# Patient Record
Sex: Female | Born: 1935 | Race: Black or African American | Hispanic: No | Marital: Married | Smoking: Former smoker
Health system: Southern US, Community
[De-identification: ages and names within clinical notes are randomized; demographics above are authoritative.]

## PROBLEM LIST (undated history)

## (undated) DIAGNOSIS — I10 Essential (primary) hypertension: Secondary | ICD-10-CM

## (undated) DIAGNOSIS — E669 Obesity, unspecified: Secondary | ICD-10-CM

## (undated) DIAGNOSIS — M1990 Unspecified osteoarthritis, unspecified site: Secondary | ICD-10-CM

## (undated) DIAGNOSIS — N189 Chronic kidney disease, unspecified: Secondary | ICD-10-CM

## (undated) DIAGNOSIS — T783XXA Angioneurotic edema, initial encounter: Secondary | ICD-10-CM

## (undated) DIAGNOSIS — R001 Bradycardia, unspecified: Secondary | ICD-10-CM

## (undated) DIAGNOSIS — M419 Scoliosis, unspecified: Secondary | ICD-10-CM

## (undated) DIAGNOSIS — L509 Urticaria, unspecified: Secondary | ICD-10-CM

## (undated) DIAGNOSIS — K219 Gastro-esophageal reflux disease without esophagitis: Secondary | ICD-10-CM

## (undated) DIAGNOSIS — T8859XA Other complications of anesthesia, initial encounter: Secondary | ICD-10-CM

## (undated) DIAGNOSIS — G4730 Sleep apnea, unspecified: Secondary | ICD-10-CM

## (undated) DIAGNOSIS — T4145XA Adverse effect of unspecified anesthetic, initial encounter: Secondary | ICD-10-CM

## (undated) DIAGNOSIS — Z923 Personal history of irradiation: Secondary | ICD-10-CM

## (undated) DIAGNOSIS — C50919 Malignant neoplasm of unspecified site of unspecified female breast: Secondary | ICD-10-CM

## (undated) HISTORY — DX: Scoliosis, unspecified: M41.9

## (undated) HISTORY — DX: Urticaria, unspecified: L50.9

## (undated) HISTORY — DX: Bradycardia, unspecified: R00.1

## (undated) HISTORY — DX: Obesity, unspecified: E66.9

## (undated) HISTORY — DX: Angioneurotic edema, initial encounter: T78.3XXA

## (undated) HISTORY — PX: DILATION AND CURETTAGE OF UTERUS: SHX78

## (undated) HISTORY — PX: TONSILLECTOMY: SUR1361

## (undated) HISTORY — DX: Sleep apnea, unspecified: G47.30

## (undated) HISTORY — DX: Malignant neoplasm of unspecified site of unspecified female breast: C50.919

## (undated) HISTORY — DX: Essential (primary) hypertension: I10

## (undated) HISTORY — PX: CARPAL TUNNEL RELEASE: SHX101

---

## 1995-04-07 HISTORY — PX: TYMPANOPLASTY: SHX33

## 1998-05-21 ENCOUNTER — Other Ambulatory Visit: Admission: RE | Admit: 1998-05-21 | Discharge: 1998-05-21 | Payer: Self-pay | Admitting: Gynecology

## 1998-06-26 ENCOUNTER — Ambulatory Visit (HOSPITAL_COMMUNITY): Admission: RE | Admit: 1998-06-26 | Discharge: 1998-06-26 | Payer: Self-pay | Admitting: Gynecology

## 1999-02-05 ENCOUNTER — Encounter: Payer: Self-pay | Admitting: Gynecology

## 1999-02-05 ENCOUNTER — Encounter: Admission: RE | Admit: 1999-02-05 | Discharge: 1999-02-05 | Payer: Self-pay | Admitting: Gynecology

## 1999-05-27 ENCOUNTER — Other Ambulatory Visit: Admission: RE | Admit: 1999-05-27 | Discharge: 1999-05-27 | Payer: Self-pay | Admitting: Gynecology

## 2000-03-04 ENCOUNTER — Encounter: Admission: RE | Admit: 2000-03-04 | Discharge: 2000-03-04 | Payer: Self-pay | Admitting: Gynecology

## 2000-03-04 ENCOUNTER — Encounter: Payer: Self-pay | Admitting: Gynecology

## 2000-05-18 ENCOUNTER — Ambulatory Visit (HOSPITAL_COMMUNITY): Admission: RE | Admit: 2000-05-18 | Discharge: 2000-05-18 | Payer: Self-pay | Admitting: Gastroenterology

## 2000-05-18 ENCOUNTER — Encounter (INDEPENDENT_AMBULATORY_CARE_PROVIDER_SITE_OTHER): Payer: Self-pay

## 2000-05-27 ENCOUNTER — Other Ambulatory Visit: Admission: RE | Admit: 2000-05-27 | Discharge: 2000-05-27 | Payer: Self-pay | Admitting: Gynecology

## 2001-06-30 ENCOUNTER — Encounter: Admission: RE | Admit: 2001-06-30 | Discharge: 2001-06-30 | Payer: Self-pay | Admitting: Gynecology

## 2001-06-30 ENCOUNTER — Encounter: Payer: Self-pay | Admitting: Gynecology

## 2001-06-30 ENCOUNTER — Encounter: Payer: Self-pay | Admitting: Internal Medicine

## 2001-07-28 ENCOUNTER — Emergency Department (HOSPITAL_COMMUNITY): Admission: EM | Admit: 2001-07-28 | Discharge: 2001-07-28 | Payer: Self-pay | Admitting: Emergency Medicine

## 2001-09-13 ENCOUNTER — Encounter: Admission: RE | Admit: 2001-09-13 | Discharge: 2001-09-13 | Payer: Self-pay | Admitting: Otolaryngology

## 2001-09-13 ENCOUNTER — Encounter: Payer: Self-pay | Admitting: Otolaryngology

## 2001-10-20 ENCOUNTER — Other Ambulatory Visit: Admission: RE | Admit: 2001-10-20 | Discharge: 2001-10-20 | Payer: Self-pay | Admitting: Gynecology

## 2002-04-05 ENCOUNTER — Encounter: Payer: Self-pay | Admitting: Orthopedic Surgery

## 2002-04-10 ENCOUNTER — Ambulatory Visit (HOSPITAL_COMMUNITY): Admission: RE | Admit: 2002-04-10 | Discharge: 2002-04-10 | Payer: Self-pay | Admitting: Orthopedic Surgery

## 2002-11-03 ENCOUNTER — Encounter: Admission: RE | Admit: 2002-11-03 | Discharge: 2002-11-03 | Payer: Self-pay | Admitting: Internal Medicine

## 2002-11-03 ENCOUNTER — Encounter: Payer: Self-pay | Admitting: Internal Medicine

## 2003-01-02 ENCOUNTER — Encounter: Payer: Self-pay | Admitting: Otolaryngology

## 2003-01-02 ENCOUNTER — Encounter: Admission: RE | Admit: 2003-01-02 | Discharge: 2003-01-02 | Payer: Self-pay | Admitting: Otolaryngology

## 2003-01-10 ENCOUNTER — Ambulatory Visit (HOSPITAL_COMMUNITY): Admission: RE | Admit: 2003-01-10 | Discharge: 2003-01-11 | Payer: Self-pay | Admitting: Otolaryngology

## 2003-01-10 ENCOUNTER — Encounter (INDEPENDENT_AMBULATORY_CARE_PROVIDER_SITE_OTHER): Payer: Self-pay | Admitting: *Deleted

## 2003-10-26 ENCOUNTER — Ambulatory Visit (HOSPITAL_COMMUNITY): Admission: RE | Admit: 2003-10-26 | Discharge: 2003-10-26 | Payer: Self-pay | Admitting: Internal Medicine

## 2004-02-08 ENCOUNTER — Encounter: Admission: RE | Admit: 2004-02-08 | Discharge: 2004-02-08 | Payer: Self-pay | Admitting: Gynecology

## 2004-12-11 ENCOUNTER — Other Ambulatory Visit: Admission: RE | Admit: 2004-12-11 | Discharge: 2004-12-11 | Payer: Self-pay | Admitting: Gynecology

## 2005-02-24 ENCOUNTER — Encounter: Admission: RE | Admit: 2005-02-24 | Discharge: 2005-02-24 | Payer: Self-pay | Admitting: Gynecology

## 2005-12-04 ENCOUNTER — Encounter: Admission: RE | Admit: 2005-12-04 | Discharge: 2005-12-04 | Payer: Self-pay | Admitting: Internal Medicine

## 2006-01-11 ENCOUNTER — Other Ambulatory Visit: Admission: RE | Admit: 2006-01-11 | Discharge: 2006-01-11 | Payer: Self-pay | Admitting: Gynecology

## 2006-03-01 ENCOUNTER — Encounter: Admission: RE | Admit: 2006-03-01 | Discharge: 2006-03-01 | Payer: Self-pay | Admitting: Gynecology

## 2006-09-13 ENCOUNTER — Encounter: Admission: RE | Admit: 2006-09-13 | Discharge: 2006-09-13 | Payer: Self-pay | Admitting: Internal Medicine

## 2007-04-15 ENCOUNTER — Encounter: Admission: RE | Admit: 2007-04-15 | Discharge: 2007-04-15 | Payer: Self-pay | Admitting: Internal Medicine

## 2007-05-02 ENCOUNTER — Encounter: Admission: RE | Admit: 2007-05-02 | Discharge: 2007-05-02 | Payer: Self-pay | Admitting: Internal Medicine

## 2007-06-03 ENCOUNTER — Emergency Department (HOSPITAL_COMMUNITY): Admission: EM | Admit: 2007-06-03 | Discharge: 2007-06-04 | Payer: Self-pay | Admitting: Emergency Medicine

## 2007-11-29 ENCOUNTER — Emergency Department (HOSPITAL_COMMUNITY): Admission: EM | Admit: 2007-11-29 | Discharge: 2007-11-30 | Payer: Self-pay | Admitting: Emergency Medicine

## 2007-11-29 ENCOUNTER — Encounter: Admission: RE | Admit: 2007-11-29 | Discharge: 2007-11-29 | Payer: Self-pay | Admitting: Internal Medicine

## 2008-04-06 DIAGNOSIS — C50919 Malignant neoplasm of unspecified site of unspecified female breast: Secondary | ICD-10-CM

## 2008-04-06 HISTORY — DX: Malignant neoplasm of unspecified site of unspecified female breast: C50.919

## 2008-06-12 ENCOUNTER — Encounter: Admission: RE | Admit: 2008-06-12 | Discharge: 2008-06-12 | Payer: Self-pay | Admitting: Gynecology

## 2008-06-19 ENCOUNTER — Encounter: Admission: RE | Admit: 2008-06-19 | Discharge: 2008-06-19 | Payer: Self-pay | Admitting: Gynecology

## 2008-06-19 ENCOUNTER — Encounter (INDEPENDENT_AMBULATORY_CARE_PROVIDER_SITE_OTHER): Payer: Self-pay | Admitting: Diagnostic Radiology

## 2008-06-27 ENCOUNTER — Ambulatory Visit: Admission: RE | Admit: 2008-06-27 | Discharge: 2008-08-16 | Payer: Self-pay | Admitting: Radiation Oncology

## 2008-07-05 ENCOUNTER — Encounter: Admission: RE | Admit: 2008-07-05 | Discharge: 2008-07-05 | Payer: Self-pay | Admitting: Surgery

## 2008-07-05 HISTORY — PX: BREAST LUMPECTOMY: SHX2

## 2008-07-10 ENCOUNTER — Encounter: Admission: RE | Admit: 2008-07-10 | Discharge: 2008-07-10 | Payer: Self-pay | Admitting: Surgery

## 2008-07-10 ENCOUNTER — Encounter (INDEPENDENT_AMBULATORY_CARE_PROVIDER_SITE_OTHER): Payer: Self-pay | Admitting: Surgery

## 2008-07-10 ENCOUNTER — Ambulatory Visit (HOSPITAL_BASED_OUTPATIENT_CLINIC_OR_DEPARTMENT_OTHER): Admission: RE | Admit: 2008-07-10 | Discharge: 2008-07-10 | Payer: Self-pay | Admitting: Surgery

## 2008-07-23 ENCOUNTER — Ambulatory Visit: Payer: Self-pay | Admitting: Oncology

## 2008-08-08 LAB — CBC WITH DIFFERENTIAL/PLATELET
Basophils Absolute: 0 10*3/uL (ref 0.0–0.1)
Eosinophils Absolute: 0.1 10*3/uL (ref 0.0–0.5)
HCT: 35.8 % (ref 34.8–46.6)
HGB: 12 g/dL (ref 11.6–15.9)
LYMPH%: 21.5 % (ref 14.0–49.7)
MCV: 83.2 fL (ref 79.5–101.0)
MONO#: 0.7 10*3/uL (ref 0.1–0.9)
MONO%: 10.1 % (ref 0.0–14.0)
NEUT#: 4.3 10*3/uL (ref 1.5–6.5)
Platelets: 228 10*3/uL (ref 145–400)
RBC: 4.31 10*6/uL (ref 3.70–5.45)
WBC: 6.6 10*3/uL (ref 3.9–10.3)

## 2008-08-09 LAB — COMPREHENSIVE METABOLIC PANEL
Albumin: 4.1 g/dL (ref 3.5–5.2)
Alkaline Phosphatase: 106 U/L (ref 39–117)
BUN: 33 mg/dL — ABNORMAL HIGH (ref 6–23)
CO2: 21 mEq/L (ref 19–32)
Glucose, Bld: 96 mg/dL (ref 70–99)
Potassium: 3.9 mEq/L (ref 3.5–5.3)
Sodium: 139 mEq/L (ref 135–145)
Total Bilirubin: 0.3 mg/dL (ref 0.3–1.2)
Total Protein: 7.3 g/dL (ref 6.0–8.3)

## 2008-08-09 LAB — CANCER ANTIGEN 27.29: CA 27.29: 37 U/mL (ref 0–39)

## 2008-08-09 LAB — VITAMIN D 25 HYDROXY (VIT D DEFICIENCY, FRACTURES): Vit D, 25-Hydroxy: 38 ng/mL (ref 30–89)

## 2008-08-09 LAB — LACTATE DEHYDROGENASE: LDH: 172 U/L (ref 94–250)

## 2008-11-09 ENCOUNTER — Ambulatory Visit: Payer: Self-pay | Admitting: Oncology

## 2008-11-13 LAB — CBC WITH DIFFERENTIAL/PLATELET
BASO%: 0.7 % (ref 0.0–2.0)
Basophils Absolute: 0.1 10*3/uL (ref 0.0–0.1)
EOS%: 1.1 % (ref 0.0–7.0)
HGB: 11.6 g/dL (ref 11.6–15.9)
MCH: 27.8 pg (ref 25.1–34.0)
MCHC: 33.2 g/dL (ref 31.5–36.0)
MCV: 83.7 fL (ref 79.5–101.0)
MONO%: 11.9 % (ref 0.0–14.0)
RBC: 4.18 10*6/uL (ref 3.70–5.45)
RDW: 15.6 % — ABNORMAL HIGH (ref 11.2–14.5)
lymph#: 2.4 10*3/uL (ref 0.9–3.3)

## 2008-11-13 LAB — BASIC METABOLIC PANEL
Chloride: 101 mEq/L (ref 96–112)
Potassium: 3.8 mEq/L (ref 3.5–5.3)
Sodium: 135 mEq/L (ref 135–145)

## 2009-01-01 DIAGNOSIS — Z853 Personal history of malignant neoplasm of breast: Secondary | ICD-10-CM | POA: Insufficient documentation

## 2009-03-27 DIAGNOSIS — E785 Hyperlipidemia, unspecified: Secondary | ICD-10-CM | POA: Diagnosis present

## 2009-03-27 DIAGNOSIS — K219 Gastro-esophageal reflux disease without esophagitis: Secondary | ICD-10-CM | POA: Diagnosis present

## 2009-03-27 DIAGNOSIS — E559 Vitamin D deficiency, unspecified: Secondary | ICD-10-CM | POA: Insufficient documentation

## 2009-03-27 DIAGNOSIS — M109 Gout, unspecified: Secondary | ICD-10-CM | POA: Insufficient documentation

## 2009-03-27 DIAGNOSIS — E1129 Type 2 diabetes mellitus with other diabetic kidney complication: Secondary | ICD-10-CM | POA: Insufficient documentation

## 2009-05-09 ENCOUNTER — Ambulatory Visit: Payer: Self-pay | Admitting: Oncology

## 2009-05-13 LAB — CBC WITH DIFFERENTIAL/PLATELET
Basophils Absolute: 0 10*3/uL (ref 0.0–0.1)
Eosinophils Absolute: 0.2 10*3/uL (ref 0.0–0.5)
HGB: 10.9 g/dL — ABNORMAL LOW (ref 11.6–15.9)
MCV: 85.5 fL (ref 79.5–101.0)
MONO%: 8 % (ref 0.0–14.0)
NEUT#: 5.4 10*3/uL (ref 1.5–6.5)
RBC: 3.83 10*6/uL (ref 3.70–5.45)
RDW: 16.5 % — ABNORMAL HIGH (ref 11.2–14.5)
WBC: 8 10*3/uL (ref 3.9–10.3)
lymph#: 1.6 10*3/uL (ref 0.9–3.3)

## 2009-05-13 LAB — COMPREHENSIVE METABOLIC PANEL
AST: 16 U/L (ref 0–37)
Albumin: 3.9 g/dL (ref 3.5–5.2)
Alkaline Phosphatase: 101 U/L (ref 39–117)
Calcium: 9.7 mg/dL (ref 8.4–10.5)
Chloride: 103 mEq/L (ref 96–112)
Glucose, Bld: 87 mg/dL (ref 70–99)
Potassium: 3.8 mEq/L (ref 3.5–5.3)
Sodium: 139 mEq/L (ref 135–145)
Total Protein: 7.2 g/dL (ref 6.0–8.3)

## 2009-05-13 LAB — VITAMIN D 25 HYDROXY (VIT D DEFICIENCY, FRACTURES): Vit D, 25-Hydroxy: 48 ng/mL (ref 30–89)

## 2009-05-28 ENCOUNTER — Encounter: Admission: RE | Admit: 2009-05-28 | Discharge: 2009-05-28 | Payer: Self-pay | Admitting: Surgery

## 2009-08-09 DIAGNOSIS — D509 Iron deficiency anemia, unspecified: Secondary | ICD-10-CM | POA: Insufficient documentation

## 2009-11-07 ENCOUNTER — Ambulatory Visit: Payer: Self-pay | Admitting: Oncology

## 2009-11-11 LAB — CBC WITH DIFFERENTIAL/PLATELET
BASO%: 0.7 % (ref 0.0–2.0)
Basophils Absolute: 0.1 10*3/uL (ref 0.0–0.1)
EOS%: 0.7 % (ref 0.0–7.0)
HCT: 32.6 % — ABNORMAL LOW (ref 34.8–46.6)
HGB: 10.9 g/dL — ABNORMAL LOW (ref 11.6–15.9)
MCH: 28.4 pg (ref 25.1–34.0)
MONO#: 0.5 10*3/uL (ref 0.1–0.9)
NEUT%: 68 % (ref 38.4–76.8)
RDW: 16.8 % — ABNORMAL HIGH (ref 11.2–14.5)
WBC: 7.9 10*3/uL (ref 3.9–10.3)
lymph#: 1.9 10*3/uL (ref 0.9–3.3)

## 2009-11-11 LAB — COMPREHENSIVE METABOLIC PANEL
ALT: 12 U/L (ref 0–35)
AST: 14 U/L (ref 0–37)
Albumin: 4.2 g/dL (ref 3.5–5.2)
BUN: 30 mg/dL — ABNORMAL HIGH (ref 6–23)
CO2: 26 mEq/L (ref 19–32)
Calcium: 9.7 mg/dL (ref 8.4–10.5)
Chloride: 103 mEq/L (ref 96–112)
Creatinine, Ser: 1.49 mg/dL — ABNORMAL HIGH (ref 0.40–1.20)
Potassium: 3.8 mEq/L (ref 3.5–5.3)

## 2009-12-20 ENCOUNTER — Ambulatory Visit: Payer: Self-pay | Admitting: Cardiovascular Disease

## 2010-04-02 ENCOUNTER — Encounter
Admission: RE | Admit: 2010-04-02 | Discharge: 2010-04-02 | Payer: Self-pay | Source: Home / Self Care | Attending: Internal Medicine | Admitting: Internal Medicine

## 2010-04-27 ENCOUNTER — Encounter: Payer: Self-pay | Admitting: Internal Medicine

## 2010-04-27 ENCOUNTER — Encounter: Payer: Self-pay | Admitting: Gynecology

## 2010-06-16 IMAGING — MG MM DIAGNOSTIC UNILATERAL L
2 series · 2 of 2 positions shown · non-contrast
Comparison: Recent mammogram, ultrasound and biopsy examinations.

CLINICAL DATA: Status post ultrasound-guided core needle biopsy of
a 6 mm 11 o'clock left breast mass.

DIGITAL DIAGNOSTIC LEFT MAMMOGRAM

[L CC]
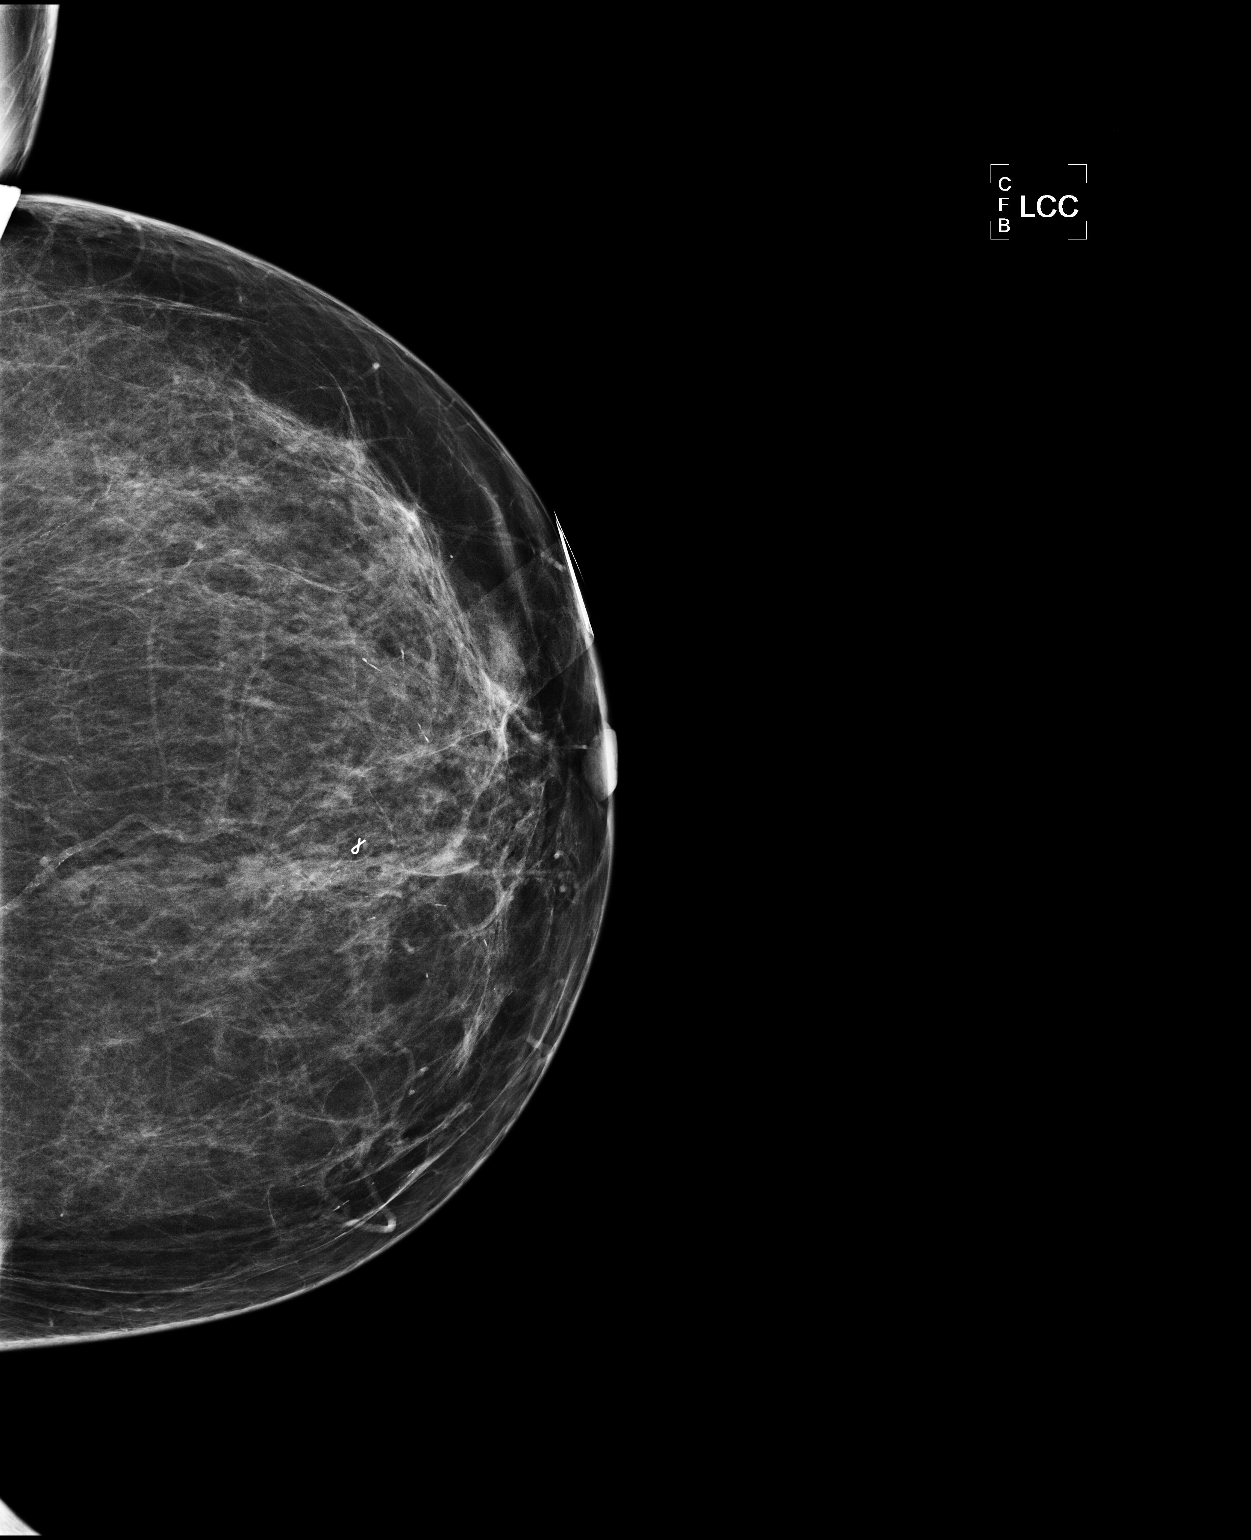

[L ML]
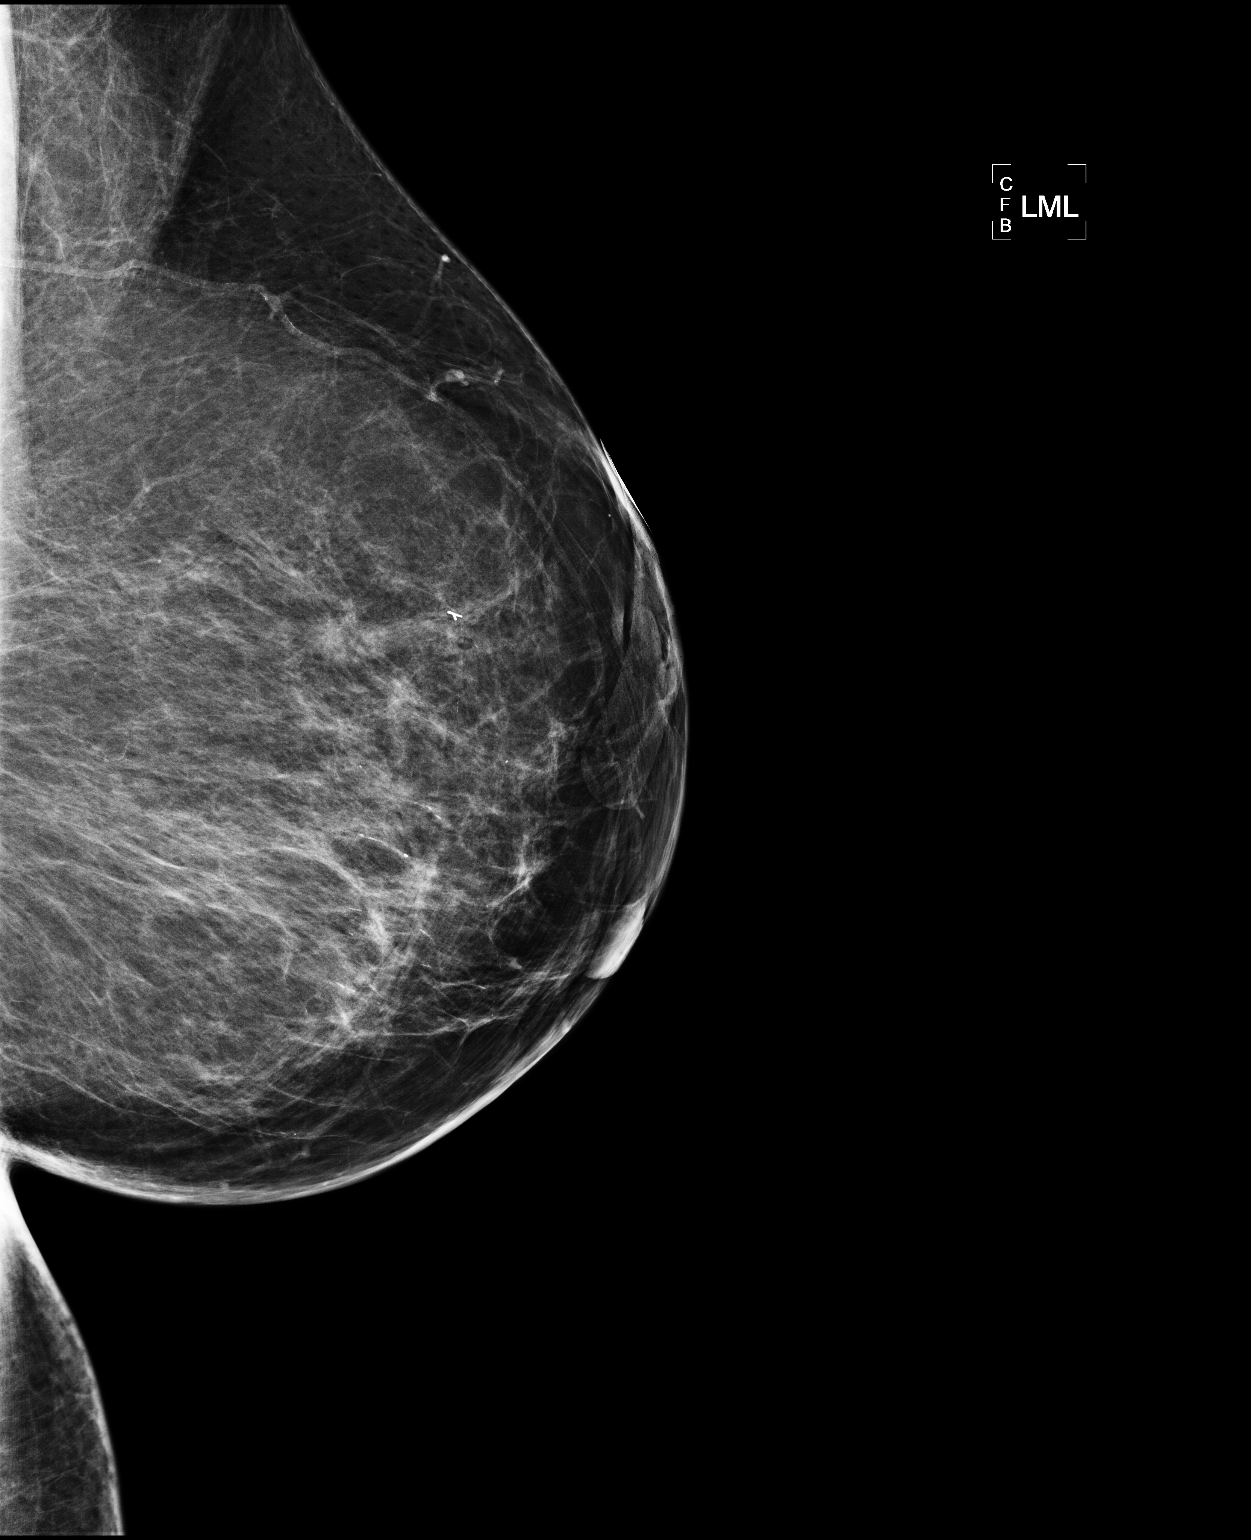

[2 of 2 positions shown; findings below may reference images not displayed]

FINDINGS: Images are performed following ultrasound guided biopsy
of a 6 mm mass in the 11 o'clock position of the left breast is
seen earlier today.  These demonstrate clip deployment anterior to
the mass.  The center of the clip is 2 cm anterior to the center of
the mass.
IMPRESSION: The biopsy marker clip deployed 2 cm anterior to the targeted mass.
This can still be used for mammographic needle localization,
targeting 2 cm posterior to the clip.

## 2010-06-16 IMAGING — MG MM DIAGNOSTIC LTD LEFT
4 series · 4 of 4 positions shown · non-contrast
Comparison: Previous examinations, including the screening
mammogram dated 06/12/2008.

CLINICAL DATA: Possible left breast mass at recent screening
mammography.

DIGITAL DIAGNOSTIC  LEFT  MAMMOGRAM  WITH CAD AND LEFT BREAST
ULTRASOUND:

[L CC (1 of 2)]
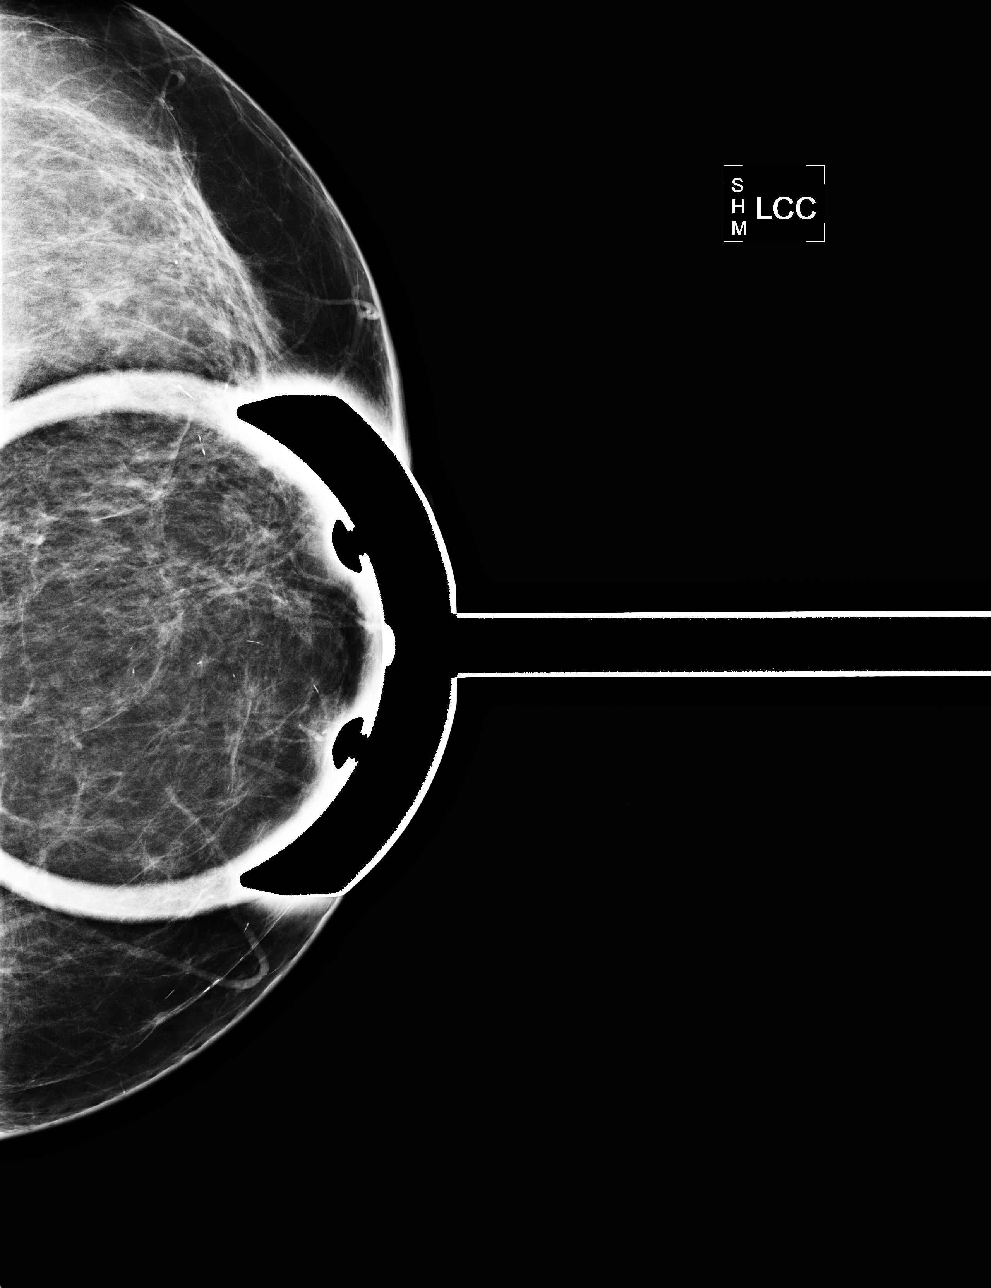

[L MLO]
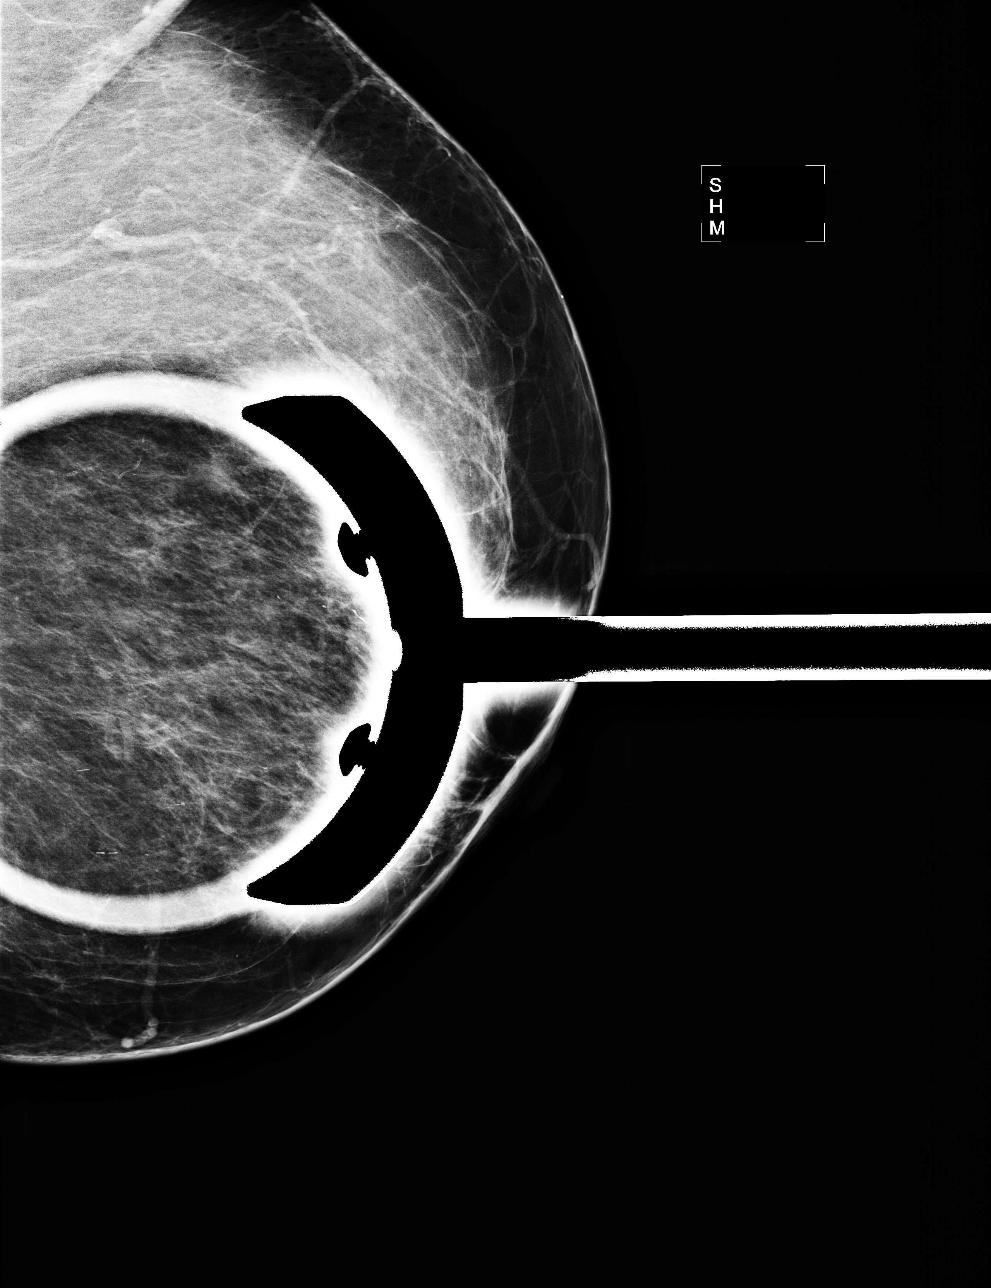

[L CC (2 of 2)]
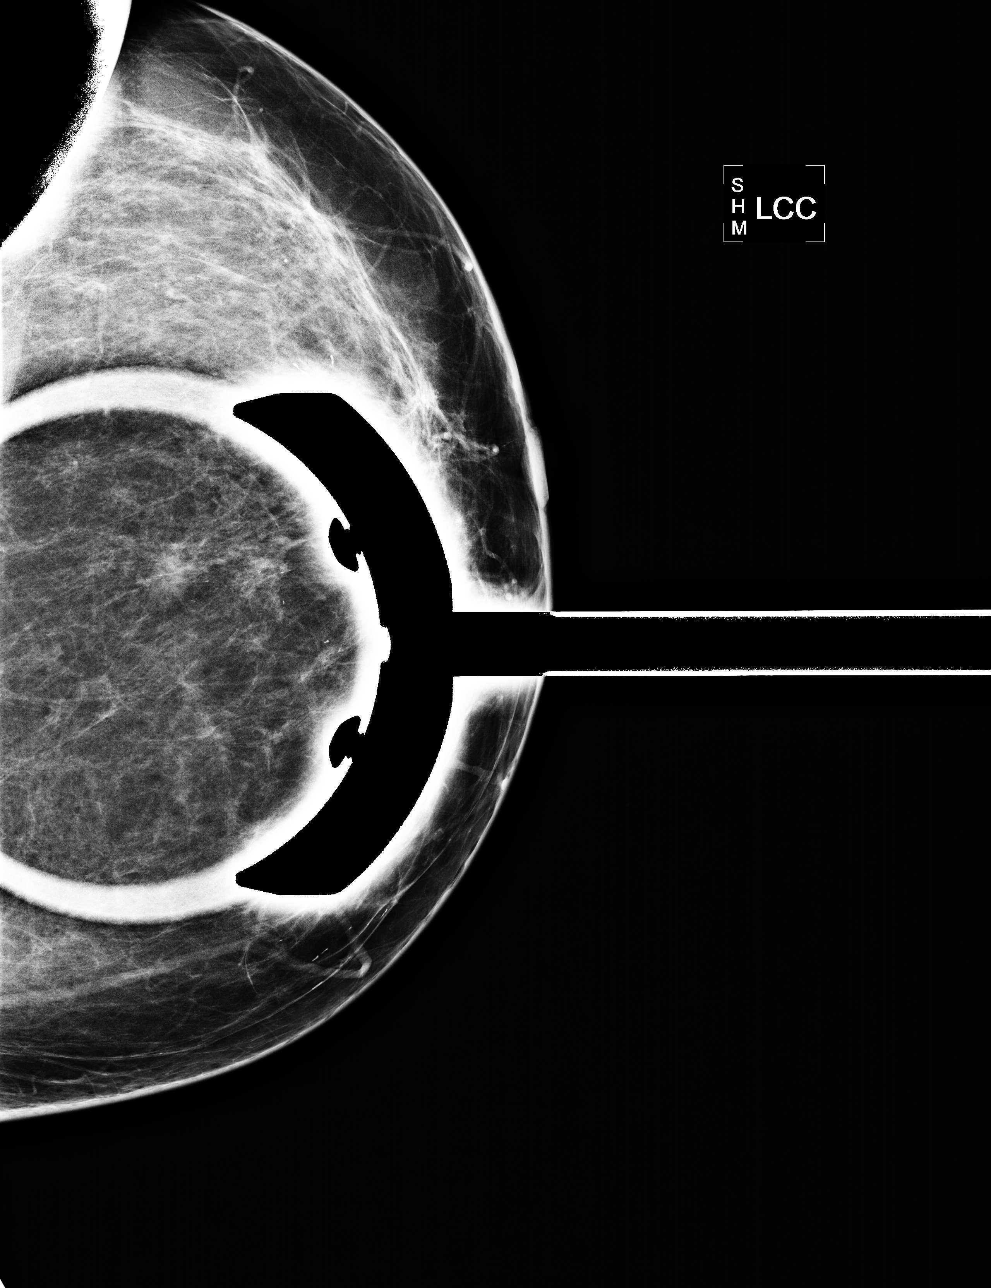

[L ML]
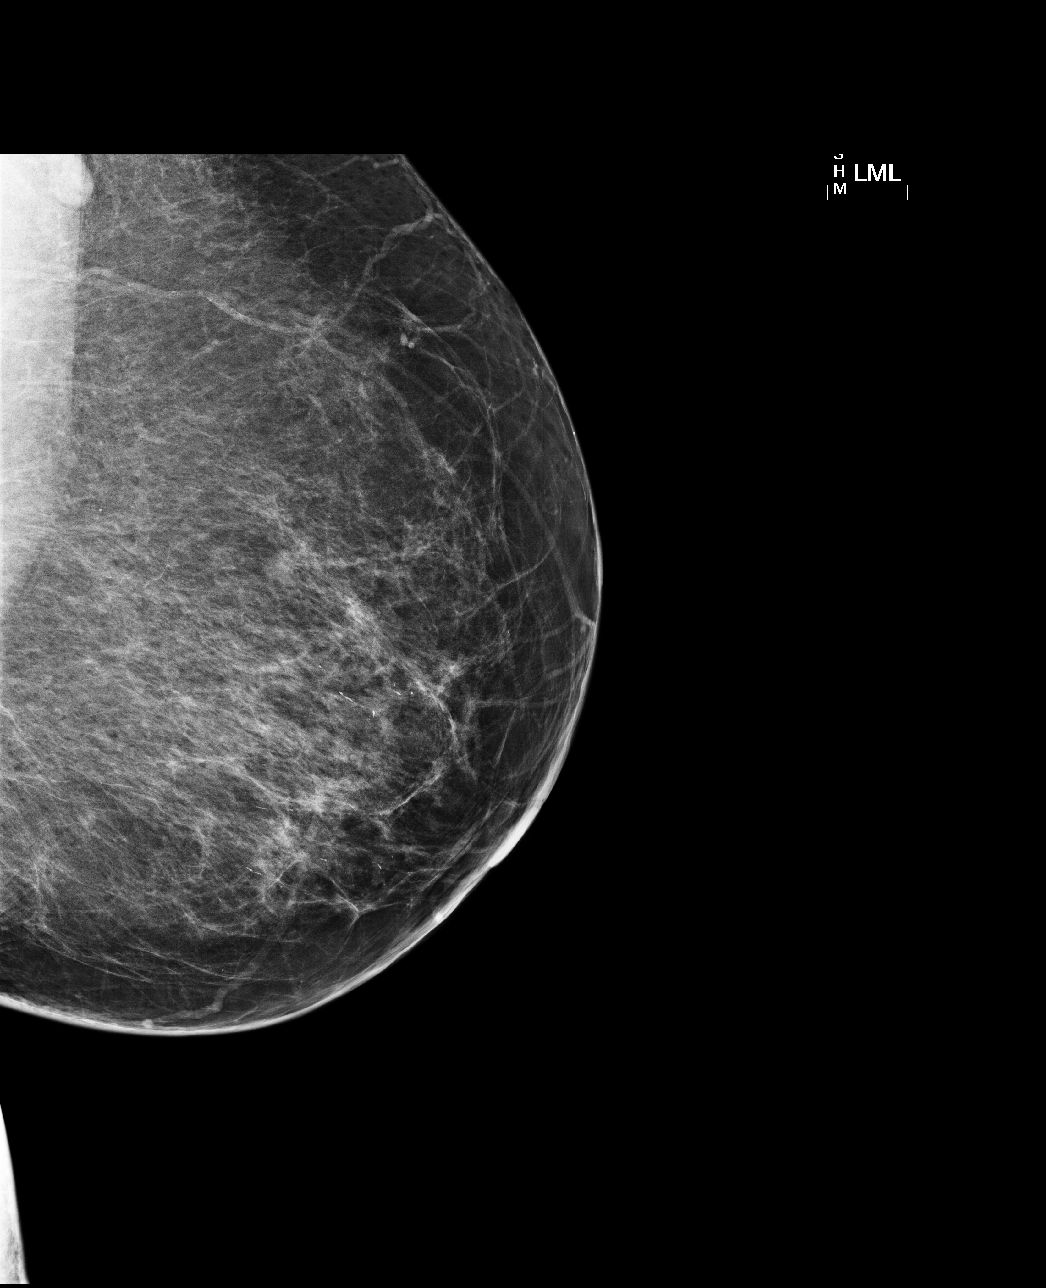

[4 of 4 positions shown; findings below may reference images not displayed]

FINDINGS: Spot compression views and a true lateral view of the
left breast confirm a small mass with poorly defined, somewhat
spiculated margins in the 11-12 o'clock position of the breast.

On physical exam, no mass is palpable in the upper left breast.

Ultrasound is performed, showing a 6 x 5 x 3 mm irregular,
hypoechoic mass in the 11 o'clock position of the left breast, 5 cm
from the nipple.  This corresponds to the mammographic mass.
IMPRESSION: 6 x 5 x 3 mm mass in the 11 o'clock position of the left breast
with imaging features highly suspicious for malignancy.  Ultrasound-
guided core needle biopsy is recommended and scheduled to follow.

BI-RADS CATEGORY 5:  Highly suggestive of malignancy - appropriate
action should be taken.

## 2010-06-17 ENCOUNTER — Other Ambulatory Visit: Payer: Self-pay | Admitting: Oncology

## 2010-06-17 DIAGNOSIS — Z9889 Other specified postprocedural states: Secondary | ICD-10-CM

## 2010-06-20 ENCOUNTER — Ambulatory Visit: Payer: Self-pay | Admitting: Cardiovascular Disease

## 2010-06-27 ENCOUNTER — Ambulatory Visit
Admission: RE | Admit: 2010-06-27 | Discharge: 2010-06-27 | Disposition: A | Discharge status: Home / Self Care | Payer: Medicare Other | Source: Ambulatory Visit | Attending: Oncology | Admitting: Oncology

## 2010-06-27 DIAGNOSIS — Z9889 Other specified postprocedural states: Secondary | ICD-10-CM

## 2010-07-16 LAB — CBC
HCT: 36 % (ref 36.0–46.0)
MCV: 83.9 fL (ref 78.0–100.0)
Platelets: 222 10*3/uL (ref 150–400)
RDW: 15.4 % (ref 11.5–15.5)

## 2010-07-16 LAB — URINALYSIS, ROUTINE W REFLEX MICROSCOPIC
Bilirubin Urine: NEGATIVE
Glucose, UA: NEGATIVE mg/dL
Hgb urine dipstick: NEGATIVE
Ketones, ur: NEGATIVE mg/dL
Protein, ur: NEGATIVE mg/dL

## 2010-07-16 LAB — COMPREHENSIVE METABOLIC PANEL
Albumin: 3.6 g/dL (ref 3.5–5.2)
BUN: 18 mg/dL (ref 6–23)
Calcium: 9.2 mg/dL (ref 8.4–10.5)
Creatinine, Ser: 1.81 mg/dL — ABNORMAL HIGH (ref 0.4–1.2)
GFR calc Af Amer: 33 mL/min — ABNORMAL LOW (ref >60)
Total Protein: 7.1 g/dL (ref 6.0–8.3)

## 2010-07-16 LAB — DIFFERENTIAL
Eosinophils Absolute: 0.1 10*3/uL (ref 0.0–0.7)
Eosinophils Relative: 2 % (ref 0–5)
Lymphs Abs: 1.9 10*3/uL (ref 0.7–4.0)
Monocytes Absolute: 0.6 10*3/uL (ref 0.1–1.0)
Monocytes Relative: 8 % (ref 3–12)

## 2010-07-16 LAB — URINE MICROSCOPIC-ADD ON

## 2010-07-23 ENCOUNTER — Encounter: Payer: Self-pay | Admitting: Cardiovascular Disease

## 2010-07-23 ENCOUNTER — Other Ambulatory Visit: Payer: Self-pay | Admitting: Oncology

## 2010-07-23 ENCOUNTER — Encounter (HOSPITAL_BASED_OUTPATIENT_CLINIC_OR_DEPARTMENT_OTHER): Payer: Medicare Other | Admitting: Oncology

## 2010-07-23 DIAGNOSIS — C50219 Malignant neoplasm of upper-inner quadrant of unspecified female breast: Secondary | ICD-10-CM

## 2010-07-23 DIAGNOSIS — R001 Bradycardia, unspecified: Secondary | ICD-10-CM | POA: Insufficient documentation

## 2010-07-23 DIAGNOSIS — E669 Obesity, unspecified: Secondary | ICD-10-CM | POA: Insufficient documentation

## 2010-07-23 DIAGNOSIS — N189 Chronic kidney disease, unspecified: Secondary | ICD-10-CM

## 2010-07-23 DIAGNOSIS — Z170 Estrogen receptor positive status [ER+]: Secondary | ICD-10-CM

## 2010-07-23 DIAGNOSIS — I10 Essential (primary) hypertension: Secondary | ICD-10-CM | POA: Insufficient documentation

## 2010-07-23 DIAGNOSIS — R079 Chest pain, unspecified: Secondary | ICD-10-CM | POA: Insufficient documentation

## 2010-07-23 DIAGNOSIS — D649 Anemia, unspecified: Secondary | ICD-10-CM

## 2010-07-23 LAB — COMPREHENSIVE METABOLIC PANEL
AST: 21 U/L (ref 0–37)
Albumin: 3.5 g/dL (ref 3.5–5.2)
Alkaline Phosphatase: 90 U/L (ref 39–117)
BUN: 23 mg/dL (ref 6–23)
Calcium: 9.2 mg/dL (ref 8.4–10.5)
Chloride: 105 mEq/L (ref 96–112)
Potassium: 3.7 mEq/L (ref 3.5–5.3)
Sodium: 139 mEq/L (ref 135–145)
Total Protein: 7.1 g/dL (ref 6.0–8.3)

## 2010-07-23 LAB — CBC WITH DIFFERENTIAL/PLATELET
Basophils Absolute: 0.1 10*3/uL (ref 0.0–0.1)
EOS%: 1.5 % (ref 0.0–7.0)
Eosinophils Absolute: 0.1 10*3/uL (ref 0.0–0.5)
HGB: 11 g/dL — ABNORMAL LOW (ref 11.6–15.9)
MCH: 27.7 pg (ref 25.1–34.0)
NEUT#: 4.9 10*3/uL (ref 1.5–6.5)
RBC: 3.96 10*6/uL (ref 3.70–5.45)
RDW: 16.1 % — ABNORMAL HIGH (ref 11.2–14.5)
lymph#: 1.6 10*3/uL (ref 0.9–3.3)

## 2010-07-24 ENCOUNTER — Encounter: Payer: Self-pay | Admitting: Cardiovascular Disease

## 2010-07-24 ENCOUNTER — Ambulatory Visit (INDEPENDENT_AMBULATORY_CARE_PROVIDER_SITE_OTHER): Payer: Medicare Other | Admitting: Cardiovascular Disease

## 2010-07-24 VITALS — BP 120/62 | HR 60 | Wt 247.0 lb

## 2010-07-24 DIAGNOSIS — I10 Essential (primary) hypertension: Secondary | ICD-10-CM

## 2010-07-24 NOTE — Progress Notes (Signed)
Vicki [name] Date of Birth  01/07/1935 [facility] / [facility] [id number] [address]     [place], [place]  [address] [phone]  Fax  [phone]  History of Present Illness:  Vicki [name] is an elderly female with a history of bradycardia, obesity and hypertension. She's has done quite well from a cardiac standpoint. She continues to have significant bone pain presumably because of her cancer medications.She denies any chest pain, shortness breath, syncope, or presyncope.  Current Outpatient Prescriptions on File Prior to Visit  Medication Sig Dispense Refill  . allopurinol (ZYLOPRIM) 100 MG tablet Take 150 mg by mouth daily.        Marland Kitchen amLODipine (NORVASC) 2.5 MG tablet Take 2.5 mg by mouth daily.        Marland Kitchen aspirin 81 MG tablet Take 81 mg by mouth every other day.        . Calcium Carbonate-Simethicone (MAALOX MAX PO) Take by mouth as needed.        . Calcium Carbonate-Vitamin D (CALCIUM + D PO) Take 600 Units by mouth daily.        . carvedilol (COREG) 12.5 MG tablet Take 12.5 mg by mouth 2 (two) times daily with a meal.        . Cholecalciferol (VITAMIN D) 1000 UNITS capsule Take 1,000 Units by mouth daily.        . colchicine 0.6 MG tablet Take 0.6 mg by mouth as needed.        . hydrochlorothiazide 25 MG tablet Take 25 mg by mouth daily.        Marland Kitchen loratadine (CLARITIN) 10 MG tablet Take 10 mg by mouth as needed.        . Multiple Vitamin (MULTIVITAMIN) tablet Take 1 tablet by mouth daily.        . pravastatin (PRAVACHOL) 40 MG tablet Take 40 mg by mouth daily.        Marland Kitchen telmisartan (MICARDIS) 80 MG tablet Take 80 mg by mouth daily.        . traMADol (ULTRAM) 50 MG tablet Take 50 mg by mouth every 6 (six) hours as needed.        . ALPRAZOLAM PO Take by mouth as needed.        . Letrozole (FEMARA PO) Take by mouth daily.        Marland Kitchen PROMETHAZINE HCL PO Take by mouth as needed.          Allergies  Allergen Reactions  . Protonix   . Sulfur     Past  Medical History  Diagnosis Date  . Breast cancer   . Bradycardia   . Chest pain   . Obesity   . Hypertension   . Asthma     No past surgical history on file.  History  Smoking status  . Former Smoker  . Quit date: 04/06/1961  Smokeless tobacco  . Not on file    History  Alcohol Use No    No family history on file.  Reviw of Systems:  Reviewed in the HPI.  All other systems are negative.  Physical Exam: BP 120/62  Pulse 60  Wt 247 lb (112.038 kg) The patient is alert and oriented x 3.  The mood and affect are normal.  The skin is warm and dry.  Color is normal.  The HEENT exam reveals that the sclera are nonicteric.  The mucous membranes are moist.  The carotids are 2+ without bruits.  There is no thyromegaly.  There is no JVD.  The lungs are clear.  The chest wall is non tender.  The heart exam reveals a regular rate with a normal S1 and S2.  There are no murmurs, gallops, or rubs.  The PMI is not displaced.   Abdominal exam reveals good bowel sounds.  There is no guarding or rebound.  There is no hepatosplenomegaly or tenderness.  There are no masses.  Exam of the legs reveal no clubbing, cyanosis, or edema.  The legs are without rashes.  The distal pulses are intact.  Cranial nerves II - XII are intact.  Motor and sensory functions are intact.  The gait is normal.  Assessment / Plan:

## 2010-07-24 NOTE — Assessment & Plan Note (Signed)
Her blood pressure is well controlled.  We'll continue with her same medications. I'll see her again in one year.

## 2010-08-19 NOTE — Op Note (Signed)
NAMEKEYONNA, Vicki [name]               ACCOUNT NO.:  [id number]   MEDICAL RECORD NO.:  [id number]          PATIENT TYPE:  AMB   LOCATION:  [place]                          FACILITY:  [facility]   PHYSICIAN:  Haywood Lasso, M.D.DATE OF BIRTH:  1934-12-02   DATE OF PROCEDURE:  07/10/2008  DATE OF DISCHARGE:                               OPERATIVE REPORT   PREOPERATIVE DIAGNOSIS:  Carcinoma, left breast, upper inner quadrant,  clinical stage I.   POSTOPERATIVE DIAGNOSIS:  Carcinoma, left breast, upper inner quadrant,  clinical stage I.   OPERATION:  Needle-guided lumpectomy with blue dye injection, sentinel  lymph node biopsy, and MammoSite CED placement.   SURGEON:  Haywood Lasso, MD   ANESTHESIA:  General.   CLINICAL HISTORY:  This is a 76 year old lady who recently had a  mammogram found to have a left breast cancer in the upper inner  quadrant.  MRI was not possible because of slight elevation and renal  function.  She was seen by radiation therapy and elected to have a  MammoSite radiation if possible.   DESCRIPTION OF PROCEDURE:  The patient saw the patient in the holding  area.  She had no further questions.  We both identified and marked the  left breast as the operative site.  She had no further questions about  the planned procedure.  I did review the mammogram films and she had two  guidewires placed, one just posterior to mass and one anterior closer to  a clip that had been placed.   The patient was taken to the operating room.  After satisfactory general  anesthesia had been obtained, the time-out was done.  I then injected  the left nipple-areolar area with 5 mL of dilute methylene blue and this  was massaged in.   Prep and drape was then done.  Using the Neoprobe, I was able to  identify hot area in the axilla and I made a transverse incision there.  I found a blue lymphatic almost immediately leading to about 1.5 to 2 cm  soft blue lymph node, which was  excised and had counts of about 1900.  Once that was out, I carefully searched the axilla and I saw some other  counts of about 30-40, but could never identify a second node, nor see  any blue dye, nor feel any other abnormalities.  Once I had this clear,  I put a moist pack and turned my attention to the breast.   The guidewires appeared to enter medially and travel a little bit  lateral as well as deep.  I made a curvilinear incision starting at the  guidewire and traveling laterally since that seemed to be the main  direction the wire was traveling.  I mobilized both wires into the  wound.  I went down through about a centimeter of breast tissue and then  tried to raise a flap little bit medially and then down to the chest  wall, then working superiorly and inferiorly to try to come around the  guidewire tracts and finally going out laterally, so I was well beyond  the tip of the needles.  As I was coming down, I did see a little old  hematoma suggesting I was closed to the biopsy site and this was medial  superior edge of the lumpectomy.  We then did specimen mammograms on  this and saw the clip in the middle.  The guidewires appeared to be  towards the medial half of the specimen.  At this point, I went back and  took the remainder of the breast tissue from the chest wall, so I had  the deep margin to include the fascia of the muscle.  I also took  additional tissue from the medial margin including the little bit of  superior margin and little bit of the inferior margin, so I had a curved  excision of that medial area.   At this point, I irrigated, make sure everything was dry.  I put 20 mL  of 0.25% plain Marcaine in.  I tried to elevate the breast a little bit  off the chest wall and closed the deep layer with some 3-0 Vicryl just  to cover the muscle a little bit.   I then selected a MammoSite to place.  Pathology had reported that the  lymph node was negative.  I made a short  incision laterally and was able  to put the balloon into the cavity and tested with about 50 mL and it  appeared symmetric and in a good location.  I then deflated the balloon  and closed the incision.  I closed the more superficial breast with a  couple of layers of 3-0 Vicryl to leave the cavity, but to have a good  closure so I would not have a disruption by the pressure of the balloon.  The skin was closed with some 4-0 Monocryl subcuticular.   I turned my attention back to the axillary incision and closed it with 3-  0 Vicryl, 4-0 Monocryl, subcuticular, and some Dermabond.   At this point, I inflated the balloon and we had it placed well medially  and used the ultrasound.  I had at least 1.5 to 2 cm skin to balloon  distance.  There seem to have good connection of the balloon to the  breast tissue, although medially it looked like there might have been a  little bit of fluid around it.   I thought this was an acceptable result.  Steri-Strips were applied as  well as sterile dressings.  The patient tolerated the procedure well and  there were no complications.  All counts were correct.      Haywood Lasso, M.D.  Electronically Signed     CJS/MEDQ  D:  07/10/2008  T:  07/11/2008  Job:  [id number]   cc:   Elta Guadeloupe A. Perini, M.D.  Selinda Orion, M.D.

## 2010-08-22 ENCOUNTER — Encounter (HOSPITAL_BASED_OUTPATIENT_CLINIC_OR_DEPARTMENT_OTHER): Payer: Medicare Other | Admitting: Oncology

## 2010-08-22 DIAGNOSIS — C50219 Malignant neoplasm of upper-inner quadrant of unspecified female breast: Secondary | ICD-10-CM

## 2010-08-22 DIAGNOSIS — Z170 Estrogen receptor positive status [ER+]: Secondary | ICD-10-CM

## 2010-08-22 NOTE — Procedures (Signed)
[facility]  Patient:    DANISHIA, ORAMAS                      MRN: [id number] Proc. Date: 05/18/00 Adm. Date:  [id number] Attending:  Rafael Bihari CC:         Selinda Orion, M.D.   Procedure Report  PROCEDURE:  Colonoscopy.  INDICATION FOR PROCEDURE:  A history of adenomatous colon polyps on the first colonoscopy three years ago.  DESCRIPTION OF PROCEDURE:  The patient was placed in the left lateral decubitus position and placed on the pulse monitor with continuous low-flow oxygen delivered by nasal cannula.  She was sedated with 70 mg IV Demerol and 7 mg IV Versed.  The Olympus video colonoscope was inserted into the rectum and advanced to the cecum, confirmed by transillumination at McBurneys point and visualization of the ileocecal valve and appendiceal orifice.  The prep was good.  The cecum and ascending colon appeared normal.  Within the transverse colon was seen an 8 mm sessile polyp which was fulgurated by hot biopsy.  The remainder of the transverse, descending, sigmoid, and rectum appeared normal with no further polyps, masses, diverticula, or other mucosal abnormalities.  The colonoscope was then withdrawn, and the patient returned to the recovery room in stable condition.  She tolerated the procedure well, and there were no immediate complications.  IMPRESSION: 1. Transverse colon polyp. 2. Otherwise normal colonoscopy.  PLAN:  Repeat colonoscopy in three years if polyp adenomatous. DD:  05/18/00 TD:  05/19/00 Job: [id number]

## 2010-08-22 NOTE — H&P (Signed)
NAME:  Vicki [name], Vicki [name]                         ACCOUNT NO.:  [id number]   MEDICAL RECORD NO.:  [id number]                   PATIENT TYPE:  OIB   LOCATION:  2550                                 FACILITY:  [facility]   PHYSICIAN:  Minna Merritts, M.D.                DATE OF BIRTH:  June 01, 1934   DATE OF ADMISSION:  01/10/2003  DATE OF DISCHARGE:                                HISTORY & PHYSICAL   REASON FOR ADMISSION:  This patient is a 76 year old female who has had ear  infections in the past on both sides.  She has had a left ear injection with  difficult times in the past and has had some mastoiditis but has resolved  this.  However, on the right side, she has an attical cholesteatoma that is  small with a small perforation in the pars flaccida anterior region and has  been under antibiotic care for some time using primarily Levaquin and Floxin  and Ciprodex drops.  She has made some improvement but the drainage and the  debris continue and CAT scan was obtained which shows a mastoiditis  throughout all the cells and she now enters for a mastoid tympanoplasty to  try to repair the tympanic membrane, try to remove some of the granulation  tissue and cholesteatomatous fat, and to try to clear the mastoid of this  infection.  She has had a considerable amount of pain.  We first started  fighting this August 24, and just continued on with cleansing and trying to  resolve this situation in a medical way.  She now enters for a right mastoid  tympanoplasty.   PAST MEDICAL HISTORY:  Her past history is remarkable in the fact that she  has had carpal tunnel on the right wrist done in 2004 and a D&C 30 years  ago.   ALLERGIES:  She has an allergy to SULFA.   CURRENT MEDICATIONS:  She is on atenolol 100 mg at noon; Claritin p.r.n. 5  mg; Lescol 80 mg h.s.; hydrochlorothiazide 25 mg at noon; Levaquin; aspirin  81; and multivitamin; Floxin or Ciprodex ear drops.   SOCIAL HISTORY:  She is a  smoker of three cigarettes a day, quit 20 years  ago, she has blood pressure elevation and has been on treatment for this for  approximately two years.   PHYSICAL EXAMINATION:  GENERAL:  She is a well-nourished, well-developed  female and weighs 249, 65 inches high.  Her CBG is 99, blood pressure  145/77, heart rate is 78.  HEENT:  Her ear on the left side is in good condition.  Her right ear shows  drainage of whitish debris in the attic region.  She has an anterior pars  flaccida retraction pocket which also has granulation tissue bulging into  the external ear canal in this pars flaccida region.  The tympanic membrane  is thickened and erythematous and is not resolving  on the normal antibiotic  therapy.  Drainage appears to be adequate.  She now has continuing drainage  with pain in the mastoid, and with this CAT scan showing chronic  mastoiditis, we are just making any progress.  The oral cavity is clear.  The larynx is clear.  The cords, false cords, epiglottis and base of tongue  are free of any ulceration or mass.  The neck is free of any thyromegaly,  cervical adenopathy or mass.  No salivary gland abnormalities.  Lips, teeth  and gums are within normal limits.  LUNGS:  Chest is clear, no rales, rhonchi or wheezes.  CARDIOVASCULAR:  No thrills, heaves, rubs, murmurs or gallops.  ABDOMEN:  Abdomen free of any organomegaly, tenderness or mass.   INITIAL DIAGNOSIS:  Right mastoiditis with otitis media with pars flaccida  retraction pocket with cholesteatoma.                                                Minna Merritts, M.D.    JC/MEDQ  D:  01/10/2003  T:  01/10/2003  Job:  [id number]   cc:   Elta Guadeloupe A. Joylene Draft, M.D.  [address]  [place]  [place] [address]  Fax: [phone]

## 2010-08-22 NOTE — Op Note (Signed)
NAME:  Vicki [name], Vicki [name]                         ACCOUNT NO.:  [id number]   MEDICAL RECORD NO.:  [id number]                   PATIENT TYPE:  OIB   LOCATION:  2550                                 FACILITY:  [facility]   PHYSICIAN:  Minna Merritts, M.D.                DATE OF BIRTH:  1934/09/03   DATE OF PROCEDURE:  01/10/2003  DATE OF DISCHARGE:                                 OPERATIVE REPORT   PREOPERATIVE DIAGNOSES:  1. Right mastoiditis with attic cholesteatoma.  2. Pars flaccida anterior.  3. Tympanic membrane perforation.   POSTOPERATIVE DIAGNOSES:  1. Right mastoiditis with attic cholesteatoma.  2. Pars flaccida anterior.  3. Tympanic membrane perforation.   PROCEDURES:  Right mastoid tympanoplasty, canal wall up.   SURGEON:  Minna Merritts, M.D.   ANESTHESIA:  General endotracheal anesthesia by Midge Minium, M.D.   DESCRIPTION OF PROCEDURE:  The patient was placed in the supine position and  under general endotracheal anesthesia the ear was prepped and draped in the  appropriate manner using Betadine times three and the usual ear drape.  The  external ear canal was examined and found to be edematous.  The tympanic  membrane was edematous.  1% Xylocaine with epinephrine was injected in to  the four quadrants of the external ear canal and then the tympanotomy  incision was carried out.  The attic retraction pocket and granulation  tissue was seen and this was partially removed at this time.  The tympanic  membrane was then reflected back and further granulation tissue was removed  from the hypotympanum and the facial recess region and the entire incus and  malleus were viewed except for the umbo which was left attached to the  tympanic membrane and granulation tissue was removed from this area.  The  chordae tympany was also guarded. The superior pars flaccida anterior region  was also found and the fat was removed leaving a perforated tympanic  membrane.  The  remainder of the ear was suctioned of brownish debris, thick  glue type material.  Once this was completed and the middle ear was opened  and aerated, then we would move toward the mastoid.  The ossicular chain  appeared to be intact. The bow was buried in granulation tissue.  We removed  some of this; did not remove it all for fear of causing scar tissue of more  formation causing future hearing problems. The cholesteatomata was very  minimal, just around the pars flaccida region.  The granulation tissue  represented 90% of the disease.  Once this was completed, a mastoidectomy  was then carried out where a postauricular incision was made. A temporalis  fascia graft was taken and saved for future use.  All hemostasis was  established with Bovie electrocoagulation.  A simple mastoidectomy was  carried out carrying it down to or past the sigmoid sinus and middle cranial  fossa dura and  working up towards the antrum and attic where there was not a  single cell that was not fluid filled; they were all full of a brownish  fluid and these were all opened and suction drainage.  The attic and antrum  were then opened.  The granulation tissue was removed and once we got down  to the attic and antrum region down to the incus it appeared to be in  reasonably decent condition.  There was no evidence of any erosion around  the horizontal semi-circular canal or the short process of the incus.  The  attic was opened more.  It became quite a narrow mastoid; it had started out  looking quite large, but became reasonably narrow.  There was no evidence of  tegmen erosion or sigmoid sinus erosion.  Once this was completed and all  the debris was removed and the cells were cleared of all their fluid and  granulation tissue, then Gelfoam with Pediotic drops were placed in the  mastoid and the mastoid was closed using double layer chromic catgut and  skin clips.  Attention was then carried back to the middle ear  where the  tympanic membrane was pushed away anteriorly and the Gelfoam was placed  anterior to the malleus and then the temporalis fascia graft was placed  draped over the malleus and over the tympanic membrane perforation which  represented the pars flaccida anteriorly.  Once this was completed, the  tympanic membrane was then brought back in to the appropriate position and  Gelfoam was placed lateral to this followed by Neosporin ointment followed  by cotton in the external ear canal followed by a mastoid dressing.  The  patient tolerated the procedure very well and is doing well postoperatively.  Facial nerve intact.  The patient is oriented and has no signs of vertigo.  The patient tolerated the procedure well.  Follow-up will be in the hospital  overnight and then in five days and then two weeks, four weeks, six weeks,  three months, six months and a year.                                               Minna Merritts, M.D.    JC/MEDQ  D:  01/10/2003  T:  01/10/2003  Job:  [id number]   cc:   Elta Guadeloupe A. Joylene Draft, M.D.  [address]  [place]  [place] [address]  Fax: [phone]

## 2010-08-22 NOTE — Op Note (Signed)
   NAME:  Vicki [name], Vicki [name]                         ACCOUNT NO.:  [id number]   MEDICAL RECORD NO.:  [id number]                   PATIENT TYPE:  AMB   LOCATION:  DAY                                  FACILITY:  [facility]   PHYSICIAN:  Tarri Glenn, M.D.               DATE OF BIRTH:  1934/12/12   DATE OF PROCEDURE:  04/10/2002  DATE OF DISCHARGE:                                 OPERATIVE REPORT   PREOPERATIVE DIAGNOSIS:  Bilateral carpal tunnel syndrome.   POSTOPERATIVE DIAGNOSIS:  Bilateral carpal tunnel syndrome.   PROCEDURE:  Decompression of the median nerve, right wrist and hand.   SURGEON:  Tarri Glenn, M.D.   ASSISTANT:  Nurse.   ANESTHESIA:  IV regional.   PATHOLOGY AND JUSTIFICATION FOR PROCEDURE:  She has signs and symptoms of  bilateral carpal tunnel syndrome, right greater than left, with nerve  conduction studies confirming this.  She elected to have the right hand  treated today.   DESCRIPTION OF PROCEDURE:  Satisfactory regional anesthesia,  Duraprep from  the midforearm to the fingertips, was draped in a sterile field.  The  surgical incision was marked out along the base of the thenar eminence,  across obliquely at the flexor crease of the wrist in the distal forearm.  The median nerve was identified at the flexor wrist.  Numerous veins were  coagulated with bipolar cautery.  I then began progressive release of skin  and very thick fascia in the mid-palm into the distal palm.  She had  significant compression of two of the distal branches in the distal palm.  Once again all bleeders were coagulated with bipolar cautery.  When I felt  the decompression had been thoroughly completed, the wound was irrigated  with sterile saline.  There was no unusual bleeding, no evidence for any  iatrogenic nerve injury.  The skin and subcutaneous tissue were then closed  with interrupted 4-0 nylon mattress sutures.  Betadine, Adaptic dry sterile  dressing and volar plaster  splint were applied.  The tourniquet was  released.  At the time of dictation she was on her way to the recovery room  in satisfactory condition with no known complications.                                                 Tarri Glenn, M.D.    JA/MEDQ  D:  04/10/2002  T:  04/10/2002  Job:  [id number]

## 2010-12-25 ENCOUNTER — Telehealth: Payer: Self-pay | Admitting: *Deleted

## 2010-12-25 NOTE — Telephone Encounter (Signed)
msg left that dr is out of town and will address medical clearance at beginning of next week, pt was seen 5 months ago and may not need app for clearance. Number given to call back with questions or concerns.

## 2010-12-26 ENCOUNTER — Other Ambulatory Visit: Payer: Self-pay | Admitting: Oncology

## 2010-12-26 ENCOUNTER — Encounter (HOSPITAL_BASED_OUTPATIENT_CLINIC_OR_DEPARTMENT_OTHER): Payer: Medicare Other | Admitting: Oncology

## 2010-12-26 DIAGNOSIS — N189 Chronic kidney disease, unspecified: Secondary | ICD-10-CM

## 2010-12-26 DIAGNOSIS — D649 Anemia, unspecified: Secondary | ICD-10-CM

## 2010-12-26 DIAGNOSIS — C50219 Malignant neoplasm of upper-inner quadrant of unspecified female breast: Secondary | ICD-10-CM

## 2010-12-26 LAB — COMPREHENSIVE METABOLIC PANEL
Albumin: 4.2 g/dL (ref 3.5–5.2)
Alkaline Phosphatase: 103 U/L (ref 39–117)
BUN: 29 mg/dL — ABNORMAL HIGH (ref 6–23)
Calcium: 9.1 mg/dL (ref 8.4–10.5)
Chloride: 100 mEq/L (ref 96–112)
Glucose, Bld: 108 mg/dL — ABNORMAL HIGH (ref 70–99)
Potassium: 3.7 mEq/L (ref 3.5–5.3)
Sodium: 141 mEq/L (ref 135–145)
Total Protein: 6.8 g/dL (ref 6.0–8.3)

## 2010-12-26 LAB — CBC WITH DIFFERENTIAL/PLATELET
BASO%: 0.5 % (ref 0.0–2.0)
EOS%: 1.4 % (ref 0.0–7.0)
MCH: 28.1 pg (ref 25.1–34.0)
MCV: 82.9 fL (ref 79.5–101.0)
MONO%: 8.6 % (ref 0.0–14.0)
RBC: 4 10*6/uL (ref 3.70–5.45)
RDW: 16.6 % — ABNORMAL HIGH (ref 11.2–14.5)
lymph#: 2 10*3/uL (ref 0.9–3.3)

## 2010-12-29 ENCOUNTER — Telehealth: Payer: Self-pay | Admitting: *Deleted

## 2010-12-29 LAB — COMPREHENSIVE METABOLIC PANEL
AST: 17
Albumin: 3.5
BUN: 19
CO2: 26
Calcium: 8.9
Chloride: 102
Creatinine, Ser: 1.41 — ABNORMAL HIGH
GFR calc Af Amer: 45 — ABNORMAL LOW
GFR calc non Af Amer: 37 — ABNORMAL LOW
Total Bilirubin: 0.5

## 2010-12-29 LAB — URINALYSIS, ROUTINE W REFLEX MICROSCOPIC
Glucose, UA: NEGATIVE
Hgb urine dipstick: NEGATIVE
Protein, ur: NEGATIVE
Specific Gravity, Urine: 1.01
Urobilinogen, UA: 0.2

## 2010-12-29 LAB — POCT CARDIAC MARKERS
CKMB, poc: <1 — ABNORMAL LOW
Myoglobin, poc: 124
Operator id: 272551
Troponin i, poc: <0.05

## 2010-12-29 LAB — URINE MICROSCOPIC-ADD ON

## 2010-12-29 LAB — B-NATRIURETIC PEPTIDE (CONVERTED LAB): Pro B Natriuretic peptide (BNP): 92

## 2010-12-29 NOTE — Telephone Encounter (Signed)
Cardiac clearance for left shoulder repair sent to Dr Gladstone Lighter.

## 2011-01-01 ENCOUNTER — Encounter (HOSPITAL_BASED_OUTPATIENT_CLINIC_OR_DEPARTMENT_OTHER): Payer: Medicare Other | Admitting: Oncology

## 2011-01-01 DIAGNOSIS — C50219 Malignant neoplasm of upper-inner quadrant of unspecified female breast: Secondary | ICD-10-CM

## 2011-01-01 DIAGNOSIS — Z170 Estrogen receptor positive status [ER+]: Secondary | ICD-10-CM

## 2011-01-02 ENCOUNTER — Encounter (INDEPENDENT_AMBULATORY_CARE_PROVIDER_SITE_OTHER): Payer: Self-pay | Admitting: Surgery

## 2011-01-14 ENCOUNTER — Other Ambulatory Visit: Payer: Self-pay | Admitting: Orthopedic Surgery

## 2011-01-14 ENCOUNTER — Encounter (HOSPITAL_COMMUNITY): Payer: Medicare Other

## 2011-01-14 ENCOUNTER — Ambulatory Visit (HOSPITAL_COMMUNITY)
Admission: RE | Admit: 2011-01-14 | Discharge: 2011-01-14 | Disposition: A | Discharge status: Home / Self Care | Payer: Medicare Other | Source: Ambulatory Visit | Attending: Orthopedic Surgery | Admitting: Orthopedic Surgery

## 2011-01-14 ENCOUNTER — Other Ambulatory Visit (HOSPITAL_COMMUNITY): Payer: Self-pay | Admitting: Orthopedic Surgery

## 2011-01-14 DIAGNOSIS — Z01812 Encounter for preprocedural laboratory examination: Secondary | ICD-10-CM | POA: Insufficient documentation

## 2011-01-14 DIAGNOSIS — M75100 Unspecified rotator cuff tear or rupture of unspecified shoulder, not specified as traumatic: Secondary | ICD-10-CM

## 2011-01-14 DIAGNOSIS — Z01818 Encounter for other preprocedural examination: Secondary | ICD-10-CM | POA: Insufficient documentation

## 2011-01-14 DIAGNOSIS — I10 Essential (primary) hypertension: Secondary | ICD-10-CM | POA: Insufficient documentation

## 2011-01-14 DIAGNOSIS — Z01810 Encounter for preprocedural cardiovascular examination: Secondary | ICD-10-CM | POA: Insufficient documentation

## 2011-01-14 DIAGNOSIS — E119 Type 2 diabetes mellitus without complications: Secondary | ICD-10-CM | POA: Insufficient documentation

## 2011-01-14 LAB — URINALYSIS, ROUTINE W REFLEX MICROSCOPIC
Hgb urine dipstick: NEGATIVE
Nitrite: NEGATIVE
Protein, ur: NEGATIVE mg/dL
Specific Gravity, Urine: 1.015 (ref 1.005–1.030)
Urobilinogen, UA: 0.2 mg/dL (ref 0.0–1.0)

## 2011-01-14 LAB — COMPREHENSIVE METABOLIC PANEL
AST: 16 U/L (ref 0–37)
Albumin: 3.7 g/dL (ref 3.5–5.2)
BUN: 22 mg/dL (ref 6–23)
Calcium: 9.9 mg/dL (ref 8.4–10.5)
Creatinine, Ser: 1.34 mg/dL — ABNORMAL HIGH (ref 0.50–1.10)
Total Protein: 8 g/dL (ref 6.0–8.3)

## 2011-01-14 LAB — CBC
HCT: 35.8 % — ABNORMAL LOW (ref 36.0–46.0)
MCHC: 33 g/dL (ref 30.0–36.0)
MCV: 83.1 fL (ref 78.0–100.0)
Platelets: 289 10*3/uL (ref 150–400)
RDW: 16.2 % — ABNORMAL HIGH (ref 11.5–15.5)

## 2011-01-14 LAB — DIFFERENTIAL
Eosinophils Absolute: 0.1 10*3/uL (ref 0.0–0.7)
Eosinophils Relative: 1 % (ref 0–5)
Lymphocytes Relative: 26 % (ref 12–46)
Lymphs Abs: 2.2 10*3/uL (ref 0.7–4.0)
Monocytes Absolute: 0.5 10*3/uL (ref 0.1–1.0)

## 2011-01-14 LAB — PROTIME-INR
INR: 1.01 (ref 0.00–1.49)
Prothrombin Time: 13.5 seconds (ref 11.6–15.2)

## 2011-01-14 LAB — URINE MICROSCOPIC-ADD ON

## 2011-01-14 LAB — APTT: aPTT: 54 seconds — ABNORMAL HIGH (ref 24–37)

## 2011-01-14 LAB — SURGICAL PCR SCREEN: MRSA, PCR: NEGATIVE

## 2011-01-16 ENCOUNTER — Ambulatory Visit (HOSPITAL_COMMUNITY)
Admission: RE | Admit: 2011-01-16 | Discharge: 2011-01-17 | Disposition: A | Discharge status: Home / Self Care | Payer: Medicare Other | Source: Ambulatory Visit | Attending: Orthopedic Surgery | Admitting: Orthopedic Surgery

## 2011-01-16 ENCOUNTER — Other Ambulatory Visit: Payer: Self-pay | Admitting: Orthopedic Surgery

## 2011-01-16 DIAGNOSIS — I10 Essential (primary) hypertension: Secondary | ICD-10-CM | POA: Insufficient documentation

## 2011-01-16 DIAGNOSIS — D1779 Benign lipomatous neoplasm of other sites: Secondary | ICD-10-CM | POA: Insufficient documentation

## 2011-01-16 DIAGNOSIS — G4733 Obstructive sleep apnea (adult) (pediatric): Secondary | ICD-10-CM | POA: Insufficient documentation

## 2011-01-16 DIAGNOSIS — Z853 Personal history of malignant neoplasm of breast: Secondary | ICD-10-CM | POA: Insufficient documentation

## 2011-01-16 DIAGNOSIS — R209 Unspecified disturbances of skin sensation: Secondary | ICD-10-CM | POA: Insufficient documentation

## 2011-01-16 DIAGNOSIS — Z79899 Other long term (current) drug therapy: Secondary | ICD-10-CM | POA: Insufficient documentation

## 2011-01-16 DIAGNOSIS — K219 Gastro-esophageal reflux disease without esophagitis: Secondary | ICD-10-CM | POA: Insufficient documentation

## 2011-01-16 DIAGNOSIS — X58XXXA Exposure to other specified factors, initial encounter: Secondary | ICD-10-CM | POA: Insufficient documentation

## 2011-01-16 DIAGNOSIS — S43429A Sprain of unspecified rotator cuff capsule, initial encounter: Secondary | ICD-10-CM | POA: Insufficient documentation

## 2011-01-17 NOTE — Op Note (Signed)
Vicki [name], Vicki [name]               ACCOUNT NO.:  [id number]  MEDICAL RECORD NO.:  [id number]  LOCATION:  [id number]                         FACILITY:  [facility]  PHYSICIAN:  Kipp Brood. Etana Beets, M.D.DATE OF BIRTH:  [birth year]  DATE OF PROCEDURE:  01/16/2011 DATE OF DISCHARGE:                              OPERATIVE REPORT   SURGEON:  Kipp Brood. Gladstone Lighter, M.D.  ASSISTANT:  Nurse.  PREOPERATIVE DIAGNOSES: 1. Extra large lipoma, left shoulder anterior. 2. Complex tear of the left rotator cuff.  POSTOPERATIVE DIAGNOSES: 1. Extra large lipoma, left shoulder anterior. 2. Complex tear of the left rotator cuff.  OPERATIONS: 1. Excision of a large lipoma of the anterior aspect of the left     shoulder, which was very large and deep. 2. Open acromionectomy of left shoulder. 3. Repair of a complex complete tear of the rotator cuff tendon, left     shoulder. 4. TissueMend graft for reinforcement of the repair of the left     shoulder. 5. Insertion of a Stryker anchor, left shoulder.  PROCEDURE:  Under general anesthesia, routine orthopedic prep and draping the left upper extremity was carried out.  The patient was in the beach-chair-type sitting position.  The appropriate time-out was carried out prior to any surgery.  I also marked the appropriate left arm in the holding area.  An incision was made over the anterior aspect of the left shoulder.  I had extended it distally because of the extreme large size of the lipoma.  I then cauterized the various small bleeding areas.  I then went down identified a large lipoma that I removed. There was no evidence of any obvious malignancy.  This was totally removed and sent to Pathology.  At that time, I went back, cauterized all the local small venous bleeders, this extended distally.  At that time, I then went up proximally over the acromion and made an incision and extended it distally and then separated the deltoid tendon from the acromion in the  usual fashion.  Note, she had a large overgrowth of the acromion.  I protected the underlying cuff that was remaining and utilized the oscillating saw did a partial acromionectomy.  At this time, I then went down identified the rotator cuff, there was a severe complex tear.  I first burred the articular surface of the humerus laterally and then grasped the tear on both sides and then sutured those primarily from side to side.  I then inserted 1 anchor and then a TissueMend graft was used to reinforce the remaining part of the defect in the tendon and the anchor suture was then reused to reinforce the distal part of the graft.  We did use a TissueMend graft. I thoroughly irrigated out the area, good hemostasis was maintained.  I inserted some thrombin-soaked Gelfoam, closed the wound layers in the usual fashion and she was placed in a sling.          ______________________________ Kipp Brood. Gladstone Lighter, M.D.     RAG/MEDQ  D:  01/16/2011  T:  01/17/2011  Job:  [id number]  cc:   Thayer Headings, M.D. [phone]  Electronically Signed by Latanya Maudlin M.D. on 01/17/2011  10:04:06 AM

## 2011-03-25 ENCOUNTER — Other Ambulatory Visit: Payer: Self-pay | Admitting: Cardiovascular Disease

## 2011-03-25 MED ORDER — CARVEDILOL 12.5 MG PO TABS: 12.5000 mg | ORAL_TABLET | Freq: Two times a day (BID) | ORAL | Status: DC

## 2011-05-07 ENCOUNTER — Other Ambulatory Visit: Payer: Self-pay | Admitting: Gynecology

## 2011-05-07 NOTE — H&P (Signed)
Leeah Chad Cordial DOB: 20-May-1934  Chief complaint: right shoulder pain  History of Present Illness The patient is a 77 year old female is scheduled for an open right rotator cuff repair with Dr. Gladstone Lighter at [facility] on May 27, 2011. She has been seen for her right shoulder pain. She has had pain at night and pain with overhead motion. MRI revealed large retracted tear of the right rotator cuff as well as arthritic changes of the right shoulder. Surgery is scheduled to decrease pain and increased function.   Allergies SULFA. 04/25/2007   Medication History Allopurinol (300MG  Tablet, Oral) Active. AmLODIPine Besylate (10MG  Tablet, Oral) Active. AmLODIPine Besylate (5MG  Tablet, Oral) Active. Carvedilol (12.5MG  Tablet, Oral) Active. Colcrys (0.6MG  Tablet, Oral) Active. Hydrochlorothiazide (25MG  Tablet, Oral) Active. Letrozole (2.5MG  Tablet, Oral) Active. Losartan Potassium (100MG  Tablet, Oral) Active. Pravastatin Sodium (40MG  Tablet, Oral) Active. TraMADol HCl (50MG  Tablet, Oral) Active.  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 10/05/1992 Insufficiency, venous NOS (459.81). 10/05/1992 Degeneration, cervical disc (722.4). 01/13/2002 Osteoarthrosis NOS, other spec site (715.98). 01/13/2002 Cervicalgia (723.1). 01/13/2002 Symptom, disturbance of skin sensation (782.0). 01/13/2002 Syndrome, carpal tunnel (354.0). 05/24/2002 Breast Cancer Allergic Urticaria Hypercholesterolemia Sleep Apnea Diabetes Mellitus, Type II High blood pressure    Family History Diabetes Mellitus. mother and brother Heart Disease. brother Drug / Alcohol Addiction. father and sister Congestive Heart Failure. father Hypertension. mother and sister Osteoarthritis. mother Cancer. mother, sister and grandmother  mothers side   Social History Children. 5 or more Illicit drug use. no Marital status. married Drug/Alcohol Rehab (Currently). no Living situation. live with spouse Exercise. Exercises weekly; does running / walking Tobacco / smoke exposure. no Pain Contract. no Tobacco use. former smoker Copy of Drug/Alcohol Rehab (Previously). no Current work status. retired   Past Surgical History Tubal Ligation ear surgery. ruptured ear drum Breast Biopsy. left Carpal Tunnel Repair. right Breast Mass; Local Excision. left Dilation and Curettage of Uterus   Review of Systems General:Present- Night Sweats. Not Present- Chills, Fever, Appetite Loss, Fatigue, Feeling sick, Weight Gain and Weight Loss. Skin:Present- Itching and Psoriasis. Not Present- Rash, Skin Color Changes, Ulcer and Change in Hair or Nails. HEENT:Not Present- Sensitivity to light, Hearing problems, Nose Bleed and Ringing in the Ears. Neck:Not Present- Swollen Glands and Neck Mass. Respiratory:Present- Dyspnea. Not Present- Snoring, Chronic Cough and Bloody sputum. Cardiovascular:Present- Shortness of Breath and Swelling of Extremities. Not Present- Chest Pain, Leg Cramps and Palpitations. Female Genitourinary:Present- Blood in Urine. Not Present- Menstrual Irregularities, Frequency, Incontinence and Nocturia. Musculoskeletal:Present- Muscle Pain, Joint Stiffness, Joint Pain and Back Pain. Not Present- Muscle Weakness and Joint Swelling. Neurological:Present- Numbness. Not Present- Tingling, Burning, Tremor, Headaches and Dizziness. Psychiatric:Not Present- Anxiety, Depression and Memory Loss. Endocrine:Not Present- Cold Intolerance, Heat Intolerance, Excessive hunger and Excessive Thirst. Hematology:Not Present- Abnormal Bleeding, Anemia, Blood Clots and Easy Bruising.   Physical Exam Exam shows painful limited range of motion in all ranges of the right shoulder. No masses or tumors about  the shoulder or axillary region. She has a good radial pulse and good function in her hand. Good finger function. Neurologically, she is fine. She has no peripheral edema. Her biceps and triceps are functioning well. Her shoulder per say is limited in all ranges. Heart sounds normal. No murmurs. Lungs clear to auscultation. Abdomen soft and nontender. Bowel sounds active. EOM intact. Neck supple, no bruits. Neuro grossly intact.  Assessment & Plan Complete rotator cuff rupture, non-traumatic (727.61) Open right rotator cuff repair with possible graft We may or may not need to use a graft material that is made from calf skin. This is approved by the FDA and I have not had a rejection of that graft yet. Also, we may need to use anchors. These are polyethylene anchors that stay in the bone and we use those anchors to suture the tendon down in certain cases where the tendon is completely pulled off the bone. There is always a chance of a secondary infection obviously with any surgery but we do use antibiotics preop.   Ardeen Jourdain, PA-C

## 2011-05-08 DIAGNOSIS — H8110 Benign paroxysmal vertigo, unspecified ear: Secondary | ICD-10-CM | POA: Insufficient documentation

## 2011-05-09 ENCOUNTER — Telehealth: Payer: Self-pay | Admitting: *Deleted

## 2011-05-09 NOTE — Telephone Encounter (Signed)
Left a message to notify pt of future appts ( 07/16/2011) lab@ 1045, amy berry @ 1115

## 2011-05-15 ENCOUNTER — Encounter: Payer: Self-pay | Admitting: Cardiovascular Disease

## 2011-05-15 ENCOUNTER — Ambulatory Visit (INDEPENDENT_AMBULATORY_CARE_PROVIDER_SITE_OTHER): Payer: Medicare Other | Admitting: Cardiovascular Disease

## 2011-05-15 VITALS — BP 132/67 | HR 60 | Ht 65.0 in | Wt 245.8 lb

## 2011-05-15 DIAGNOSIS — I10 Essential (primary) hypertension: Secondary | ICD-10-CM

## 2011-05-15 NOTE — Progress Notes (Signed)
Deoni Chad Cordial Date of Birth  Aug 27, 1934 [place] [address]    [place]   [place], [place]  [address]    [place], [place]  [address] [phone]  Fax  [phone]  [phone]  Fax [phone]   History of Present Illness:  Ms. Tiffany Kocher is a 77 yo with a hx of HTN, breast cancer.  She presents earlier than expected to today for pre-op evaluation prior to having right shoulder surgery.  She injured her right shoulder years ago.  She fell recently and injured her left shoulder ( which has already been reapired.)  Denies any chest pain or dyspnea.  She exercises on an intermittant basis.   Current Outpatient Prescriptions on File Prior to Visit  Medication Sig Dispense Refill  . allopurinol (ZYLOPRIM) 100 MG tablet Take 150 mg by mouth daily.        Marland Kitchen ALPRAZOLAM PO Take by mouth as needed.        Marland Kitchen amLODipine (NORVASC) 2.5 MG tablet Take 2.5 mg by mouth daily.        Marland Kitchen aspirin 81 MG tablet Take 81 mg by mouth every other day.        . Calcium Carbonate-Simethicone (MAALOX MAX PO) Take by mouth as needed.        . Calcium Carbonate-Vitamin D (CALCIUM + D PO) Take 600 Units by mouth daily.        . carvedilol (COREG) 12.5 MG tablet Take 1 tablet (12.5 mg total) by mouth 2 (two) times daily.  60 tablet  11  . Cholecalciferol (VITAMIN D) 1000 UNITS capsule Take 1,000 Units by mouth daily.        . colchicine 0.6 MG tablet Take 0.6 mg by mouth as needed.        . hydrochlorothiazide 25 MG tablet Take 25 mg by mouth daily.        . Letrozole (FEMARA PO) Take by mouth daily.        . Multiple Vitamin (MULTIVITAMIN) tablet Take 1 tablet by mouth daily.        Marland Kitchen PROMETHAZINE HCL PO Take by mouth as needed.        Marland Kitchen telmisartan (MICARDIS) 80 MG tablet Take 80 mg by mouth daily.        . traMADol (ULTRAM) 50 MG tablet Take 50 mg by mouth every 6 (six) hours as needed.          Allergies  Allergen Reactions  . Protonix   . Sulfur      Past Medical History  Diagnosis Date  . Breast cancer 2010    Left  . Bradycardia   . Chest pain   . Obesity   . Hypertension   . Asthma     Past Surgical History  Procedure Date  . Breast lumpectomy 07/2008    Left - Dr Margot Chimes    History  Smoking status  . Former Smoker  . Quit date: 04/06/1961  Smokeless tobacco  . Not on file    History  Alcohol Use No    No family history on file.  Reviw of Systems:  Reviewed in the HPI.  All other systems are negative.  Physical Exam: Blood pressure 132/67, pulse 60, height 5\' 5"  (1.651 m), weight 245 lb 12.8 oz (111.494 kg). General: Well developed, well nourished, in no acute distress.  Head: Normocephalic, atraumatic, sclera non-icteric, mucus membranes are moist,  Neck: Supple. Negative for carotid bruits. JVD not elevated.  Lungs: Clear bilaterally to auscultation without wheezes, rales, or rhonchi. Breathing is unlabored.  Heart: RRR with S1 S2. No murmurs, rubs, or gallops appreciated.  Abdomen: Soft, non-tender, non-distended with normoactive bowel sounds. No hepatomegaly. No rebound/guarding. No obvious abdominal masses.  Msk:  Strength and tone appear normal for age.  Extremities: No clubbing or cyanosis. No edema.  Distal pedal pulses are 2+ and equal bilaterally.  Neuro: Alert and oriented X 3. Moves all extremities spontaneously.  Psych:  Responds to questions appropriately with a normal affect.  ECG:  Assessment / Plan:

## 2011-05-15 NOTE — Patient Instructions (Signed)
Your physician wants you to follow-up in: 6 months  You will receive a reminder letter in the mail two months in advance. If you don't receive a letter, please call our office to schedule the follow-up appointment.  You are cleared for shoulder surgery, your surgeon will contact us to let us know if anything else is needed.

## 2011-05-15 NOTE — Assessment & Plan Note (Addendum)
Vicki [name] is a 77 yo with a hx of HTN.  She has not had any cardiac problems. Her blood pressure is fairly well controlled.  She is at low risk for her upcoming shoulder surgery.  We'll see her again in 6 months for followup office visit.

## 2011-05-20 ENCOUNTER — Encounter (HOSPITAL_COMMUNITY): Payer: Self-pay | Admitting: Pharmacy Technician

## 2011-05-25 ENCOUNTER — Encounter (HOSPITAL_COMMUNITY)
Admission: RE | Admit: 2011-05-25 | Discharge: 2011-05-25 | Disposition: A | Discharge status: Home / Self Care | Payer: Medicare Other | Source: Ambulatory Visit | Attending: Orthopedic Surgery | Admitting: Orthopedic Surgery

## 2011-05-25 ENCOUNTER — Encounter (HOSPITAL_COMMUNITY): Payer: Self-pay

## 2011-05-25 HISTORY — DX: Chronic kidney disease, unspecified: N18.9

## 2011-05-25 HISTORY — DX: Gastro-esophageal reflux disease without esophagitis: K21.9

## 2011-05-25 HISTORY — DX: Unspecified osteoarthritis, unspecified site: M19.90

## 2011-05-25 HISTORY — DX: Other complications of anesthesia, initial encounter: T88.59XA

## 2011-05-25 HISTORY — DX: Adverse effect of unspecified anesthetic, initial encounter: T41.45XA

## 2011-05-25 HISTORY — DX: Sleep apnea, unspecified: G47.30

## 2011-05-25 LAB — DIFFERENTIAL
Basophils Absolute: 0 10*3/uL (ref 0.0–0.1)
Basophils Relative: 1 % (ref 0–1)
Eosinophils Absolute: 0.1 10*3/uL (ref 0.0–0.7)
Eosinophils Relative: 1 % (ref 0–5)
Lymphocytes Relative: 31 % (ref 12–46)
Lymphs Abs: 2.6 10*3/uL (ref 0.7–4.0)
Monocytes Absolute: 0.7 10*3/uL (ref 0.1–1.0)
Monocytes Relative: 8 % (ref 3–12)
Neutro Abs: 5 10*3/uL (ref 1.7–7.7)
Neutrophils Relative %: 60 % (ref 43–77)

## 2011-05-25 LAB — CBC
HCT: 36.2 % (ref 36.0–46.0)
Hemoglobin: 11.8 g/dL — ABNORMAL LOW (ref 12.0–15.0)
MCH: 26.8 pg (ref 26.0–34.0)
MCHC: 32.6 g/dL (ref 30.0–36.0)
MCV: 82.3 fL (ref 78.0–100.0)
Platelets: 268 10*3/uL (ref 150–400)
RBC: 4.4 MIL/uL (ref 3.87–5.11)
RDW: 15.8 % — ABNORMAL HIGH (ref 11.5–15.5)
WBC: 8.3 10*3/uL (ref 4.0–10.5)

## 2011-05-25 LAB — URINE MICROSCOPIC-ADD ON

## 2011-05-25 LAB — URINALYSIS, ROUTINE W REFLEX MICROSCOPIC
Bilirubin Urine: NEGATIVE
Glucose, UA: NEGATIVE mg/dL
Hgb urine dipstick: NEGATIVE
Ketones, ur: NEGATIVE mg/dL
Nitrite: NEGATIVE
Protein, ur: NEGATIVE mg/dL
Specific Gravity, Urine: 1.021 (ref 1.005–1.030)
Urobilinogen, UA: 0.2 mg/dL (ref 0.0–1.0)
pH: 5.5 (ref 5.0–8.0)

## 2011-05-25 LAB — APTT: aPTT: 50 seconds — ABNORMAL HIGH (ref 24–37)

## 2011-05-25 LAB — COMPREHENSIVE METABOLIC PANEL
ALT: 15 U/L (ref 0–35)
AST: 16 U/L (ref 0–37)
Albumin: 3.8 g/dL (ref 3.5–5.2)
Alkaline Phosphatase: 107 U/L (ref 39–117)
BUN: 20 mg/dL (ref 6–23)
CO2: 26 mEq/L (ref 19–32)
Calcium: 9.4 mg/dL (ref 8.4–10.5)
Chloride: 102 mEq/L (ref 96–112)
Creatinine, Ser: 1.47 mg/dL — ABNORMAL HIGH (ref 0.50–1.10)
GFR calc Af Amer: 39 mL/min — ABNORMAL LOW (ref >90)
GFR calc non Af Amer: 34 mL/min — ABNORMAL LOW (ref >90)
Glucose, Bld: 104 mg/dL — ABNORMAL HIGH (ref 70–99)
Potassium: 3.5 mEq/L (ref 3.5–5.1)
Sodium: 139 mEq/L (ref 135–145)
Total Bilirubin: 0.2 mg/dL — ABNORMAL LOW (ref 0.3–1.2)
Total Protein: 7.7 g/dL (ref 6.0–8.3)

## 2011-05-25 LAB — PROTIME-INR
INR: 1.06 (ref 0.00–1.49)
Prothrombin Time: 14 seconds (ref 11.6–15.2)

## 2011-05-25 NOTE — Patient Instructions (Signed)
[place]  05/25/2011   Your procedure is scheduled on:  05/27/11   Surgery 1130-1300 [facility]  Report to [place] at 0900       AM.  Call this number if you have problems the morning of surgery: [phone]    Remember:             [place]  Do not eat food:After Midnight.TUESDAY NIGHT  May have clear liquids: none after MIDNIGHT Tuesday NIGHT  Clear liquids include soda, tea, black coffee, apple or grape juice, broth.  Take these medicines the morning of surgery with A SIP OF WATER: COREG, FEMARA with sip water   Do not wear jewelry, make-up or nail polish.  Do not wear lotions, powders, or perfumes. You may wear deodorant.  Do not shave 48 hours prior to surgery.  Do not bring valuables to the hospital.  Contacts, dentures or bridgework may not be worn into surgery.  Leave suitcase in the car. After surgery it may be brought to your room.  For patients admitted to the hospital, checkout time is 11:00 AM the day of discharge.   Patients discharged the day of surgery will not be allowed to drive home.  Name and phone number of your driver:   husband                                                                   Special Instructions: CHG Shower Use Special Wash: 1/2 bottle night before surgery and 1/2 bottle morning of surgery. REGULAR SOAP FACE AND PRIVATES              LADIES- NO SHAVING 23 HOURS BEFORE USING BETASEPT SOAP.                  Please read over the following fact sheets that you were given: MRSA Information

## 2011-05-25 NOTE — Pre-Procedure Instructions (Signed)
Notified AMANDA at scheduling at Dr Fabian November office(asked to page nurse working with Dr Gladstone Lighter with no answer)-Amanda to send intraoffice email for abnormal labs- urine with micro, ptt and creatnine to be reviewed 1430

## 2011-05-27 ENCOUNTER — Encounter (HOSPITAL_COMMUNITY): Payer: Self-pay | Admitting: Anesthesiology

## 2011-05-27 ENCOUNTER — Encounter (HOSPITAL_COMMUNITY): Payer: Self-pay | Admitting: *Deleted

## 2011-05-27 ENCOUNTER — Ambulatory Visit (HOSPITAL_COMMUNITY)
Admission: RE | Admit: 2011-05-27 | Discharge: 2011-05-28 | Disposition: A | Discharge status: Home / Self Care | Payer: Medicare Other | Source: Ambulatory Visit | Attending: Orthopedic Surgery | Admitting: Orthopedic Surgery

## 2011-05-27 ENCOUNTER — Encounter (HOSPITAL_COMMUNITY)
Admission: RE | Disposition: A | Discharge status: Home / Self Care | Payer: Self-pay | Source: Ambulatory Visit | Attending: Orthopedic Surgery

## 2011-05-27 ENCOUNTER — Ambulatory Visit (HOSPITAL_COMMUNITY): Payer: Medicare Other | Admitting: Anesthesiology

## 2011-05-27 DIAGNOSIS — Z79899 Other long term (current) drug therapy: Secondary | ICD-10-CM | POA: Insufficient documentation

## 2011-05-27 DIAGNOSIS — I129 Hypertensive chronic kidney disease with stage 1 through stage 4 chronic kidney disease, or unspecified chronic kidney disease: Secondary | ICD-10-CM | POA: Insufficient documentation

## 2011-05-27 DIAGNOSIS — M75121 Complete rotator cuff tear or rupture of right shoulder, not specified as traumatic: Secondary | ICD-10-CM | POA: Diagnosis present

## 2011-05-27 DIAGNOSIS — K219 Gastro-esophageal reflux disease without esophagitis: Secondary | ICD-10-CM | POA: Insufficient documentation

## 2011-05-27 DIAGNOSIS — Z01812 Encounter for preprocedural laboratory examination: Secondary | ICD-10-CM | POA: Insufficient documentation

## 2011-05-27 DIAGNOSIS — N189 Chronic kidney disease, unspecified: Secondary | ICD-10-CM | POA: Insufficient documentation

## 2011-05-27 DIAGNOSIS — M19019 Primary osteoarthritis, unspecified shoulder: Secondary | ICD-10-CM | POA: Insufficient documentation

## 2011-05-27 DIAGNOSIS — E7800 Pure hypercholesterolemia, unspecified: Secondary | ICD-10-CM | POA: Insufficient documentation

## 2011-05-27 DIAGNOSIS — M25519 Pain in unspecified shoulder: Secondary | ICD-10-CM | POA: Insufficient documentation

## 2011-05-27 DIAGNOSIS — M25819 Other specified joint disorders, unspecified shoulder: Secondary | ICD-10-CM | POA: Insufficient documentation

## 2011-05-27 DIAGNOSIS — E119 Type 2 diabetes mellitus without complications: Secondary | ICD-10-CM | POA: Insufficient documentation

## 2011-05-27 DIAGNOSIS — Z853 Personal history of malignant neoplasm of breast: Secondary | ICD-10-CM | POA: Insufficient documentation

## 2011-05-27 DIAGNOSIS — M75120 Complete rotator cuff tear or rupture of unspecified shoulder, not specified as traumatic: Secondary | ICD-10-CM | POA: Insufficient documentation

## 2011-05-27 HISTORY — PX: SHOULDER OPEN ROTATOR CUFF REPAIR: SHX2407

## 2011-05-27 LAB — GLUCOSE, CAPILLARY: Glucose-Capillary: 109 mg/dL — ABNORMAL HIGH (ref 70–99)

## 2011-05-27 LAB — TYPE AND SCREEN
ABO/RH(D): O POS
Antibody Screen: NEGATIVE

## 2011-05-27 SURGERY — REPAIR, ROTATOR CUFF, OPEN
Anesthesia: General | Site: Shoulder | Laterality: Right | Wound class: Clean

## 2011-05-27 MED ORDER — HYDROCHLOROTHIAZIDE 25 MG PO TABS
25.0000 mg | ORAL_TABLET | Freq: Every day | ORAL | Status: DC
  Administered 2011-05-27 – 2011-05-28 (×2): 25 mg via ORAL
  Filled 2011-05-27 (×2): qty 1

## 2011-05-27 MED ORDER — OLMESARTAN 10 MG HALF TABLET: 10.0000 mg | ORAL_TABLET | Freq: Every day | ORAL | Status: DC

## 2011-05-27 MED ORDER — ONDANSETRON HCL 4 MG/2ML IJ SOLN
INTRAMUSCULAR | Status: DC | PRN
  Administered 2011-05-27: 4 mg via INTRAVENOUS

## 2011-05-27 MED ORDER — ACETAMINOPHEN 650 MG RE SUPP: 650.0000 mg | Freq: Four times a day (QID) | RECTAL | Status: DC | PRN

## 2011-05-27 MED ORDER — CEFAZOLIN SODIUM-DEXTROSE 2-3 GM-% IV SOLR
2.0000 g | Freq: Once | INTRAVENOUS | Status: AC
  Administered 2011-05-27: 2 g via INTRAVENOUS

## 2011-05-27 MED ORDER — ACETAMINOPHEN 325 MG PO TABS: 650.0000 mg | ORAL_TABLET | Freq: Four times a day (QID) | ORAL | Status: DC | PRN

## 2011-05-27 MED ORDER — THROMBIN 5000 UNITS EX SOLR
CUTANEOUS | Status: AC
  Filled 2011-05-27: qty 5000

## 2011-05-27 MED ORDER — METHOCARBAMOL 500 MG PO TABS
500.0000 mg | ORAL_TABLET | Freq: Four times a day (QID) | ORAL | Status: DC | PRN
  Filled 2011-05-27: qty 1

## 2011-05-27 MED ORDER — LACTATED RINGERS IV SOLN
INTRAVENOUS | Status: DC
  Administered 2011-05-27: 1000 mL via INTRAVENOUS

## 2011-05-27 MED ORDER — THROMBIN 5000 UNITS EX KIT
PACK | CUTANEOUS | Status: DC | PRN
  Administered 2011-05-27: 5000 [IU] via TOPICAL

## 2011-05-27 MED ORDER — ONDANSETRON HCL 4 MG/2ML IJ SOLN: 4.0000 mg | Freq: Four times a day (QID) | INTRAMUSCULAR | Status: DC | PRN

## 2011-05-27 MED ORDER — PROMETHAZINE HCL 25 MG/ML IJ SOLN: 6.2500 mg | INTRAMUSCULAR | Status: DC | PRN

## 2011-05-27 MED ORDER — BUPIVACAINE LIPOSOME 1.3 % IJ SUSP
INTRAMUSCULAR | Status: DC | PRN
  Administered 2011-05-27: 20 mL

## 2011-05-27 MED ORDER — ROSUVASTATIN CALCIUM 5 MG PO TABS
5.0000 mg | ORAL_TABLET | Freq: Every evening | ORAL | Status: DC
  Administered 2011-05-27: 5 mg via ORAL
  Filled 2011-05-27 (×2): qty 1

## 2011-05-27 MED ORDER — DEXTROSE 5 % IV SOLN
500.0000 mg | Freq: Four times a day (QID) | INTRAVENOUS | Status: DC | PRN
  Administered 2011-05-27: 500 mg via INTRAVENOUS
  Filled 2011-05-27 (×2): qty 5

## 2011-05-27 MED ORDER — POLYETHYLENE GLYCOL 3350 17 G PO PACK
17.0000 g | PACK | Freq: Every day | ORAL | Status: DC | PRN
  Filled 2011-05-27: qty 1

## 2011-05-27 MED ORDER — MENTHOL 3 MG MT LOZG: 1.0000 | LOZENGE | OROMUCOSAL | Status: DC | PRN

## 2011-05-27 MED ORDER — PHENOL 1.4 % MT LIQD: 1.0000 | OROMUCOSAL | Status: DC | PRN

## 2011-05-27 MED ORDER — AMLODIPINE BESYLATE 2.5 MG PO TABS
2.5000 mg | ORAL_TABLET | Freq: Once | ORAL | Status: AC
  Administered 2011-05-27: 2.5 mg via ORAL
  Filled 2011-05-27: qty 1

## 2011-05-27 MED ORDER — ASPIRIN EC 81 MG PO TBEC
81.0000 mg | DELAYED_RELEASE_TABLET | ORAL | Status: DC
  Administered 2011-05-27: 81 mg via ORAL
  Filled 2011-05-27: qty 1

## 2011-05-27 MED ORDER — FLEET ENEMA 7-19 GM/118ML RE ENEM: 1.0000 | ENEMA | Freq: Once | RECTAL | Status: AC | PRN

## 2011-05-27 MED ORDER — CARVEDILOL 25 MG PO TABS
25.0000 mg | ORAL_TABLET | Freq: Two times a day (BID) | ORAL | Status: DC
  Administered 2011-05-27 – 2011-05-28 (×2): 25 mg via ORAL
  Filled 2011-05-27 (×3): qty 1

## 2011-05-27 MED ORDER — ROCURONIUM BROMIDE 100 MG/10ML IV SOLN
INTRAVENOUS | Status: DC | PRN
  Administered 2011-05-27: 30 mg via INTRAVENOUS

## 2011-05-27 MED ORDER — NEOSTIGMINE METHYLSULFATE 1 MG/ML IJ SOLN
INTRAMUSCULAR | Status: DC | PRN
  Administered 2011-05-27: 4 mg via INTRAVENOUS

## 2011-05-27 MED ORDER — ALLOPURINOL 150 MG HALF TABLET
150.0000 mg | ORAL_TABLET | Freq: Every day | ORAL | Status: DC
  Administered 2011-05-27 – 2011-05-28 (×2): 150 mg via ORAL
  Filled 2011-05-27 (×2): qty 1

## 2011-05-27 MED ORDER — OXYCODONE-ACETAMINOPHEN 5-325 MG PO TABS: 1.0000 | ORAL_TABLET | ORAL | Status: DC | PRN

## 2011-05-27 MED ORDER — CEFAZOLIN SODIUM 1-5 GM-% IV SOLN
1.0000 g | Freq: Four times a day (QID) | INTRAVENOUS | Status: AC
  Administered 2011-05-27 – 2011-05-28 (×3): 1 g via INTRAVENOUS
  Filled 2011-05-27 (×3): qty 50

## 2011-05-27 MED ORDER — HYDROMORPHONE HCL PF 1 MG/ML IJ SOLN
0.2500 mg | INTRAMUSCULAR | Status: DC | PRN
  Administered 2011-05-27 (×4): 0.5 mg via INTRAVENOUS

## 2011-05-27 MED ORDER — ACETAMINOPHEN 10 MG/ML IV SOLN
INTRAVENOUS | Status: DC | PRN
  Administered 2011-05-27: 1000 mg via INTRAVENOUS

## 2011-05-27 MED ORDER — HYDROCODONE-ACETAMINOPHEN 5-325 MG PO TABS
1.0000 | ORAL_TABLET | ORAL | Status: DC | PRN
  Administered 2011-05-27: 2 via ORAL
  Administered 2011-05-28: 1 via ORAL
  Administered 2011-05-28 (×2): 2 via ORAL
  Filled 2011-05-27 (×4): qty 2

## 2011-05-27 MED ORDER — LACTATED RINGERS IV SOLN
INTRAVENOUS | Status: DC
  Administered 2011-05-27 – 2011-05-28 (×2): via INTRAVENOUS

## 2011-05-27 MED ORDER — ONDANSETRON HCL 4 MG PO TABS: 4.0000 mg | ORAL_TABLET | Freq: Four times a day (QID) | ORAL | Status: DC | PRN

## 2011-05-27 MED ORDER — LIDOCAINE HCL (CARDIAC) 20 MG/ML IV SOLN
INTRAVENOUS | Status: DC | PRN
  Administered 2011-05-27: 100 mg via INTRAVENOUS

## 2011-05-27 MED ORDER — FENTANYL CITRATE 0.05 MG/ML IJ SOLN
INTRAMUSCULAR | Status: DC | PRN
  Administered 2011-05-27 (×5): 50 ug via INTRAVENOUS

## 2011-05-27 MED ORDER — BACITRACIN ZINC 500 UNIT/GM EX OINT
TOPICAL_OINTMENT | CUTANEOUS | Status: AC
  Filled 2011-05-27: qty 15

## 2011-05-27 MED ORDER — ASPIRIN 81 MG PO TABS: 81.0000 mg | ORAL_TABLET | ORAL | Status: DC

## 2011-05-27 MED ORDER — BISACODYL 10 MG RE SUPP: 10.0000 mg | Freq: Every day | RECTAL | Status: DC | PRN

## 2011-05-27 MED ORDER — SUCCINYLCHOLINE CHLORIDE 20 MG/ML IJ SOLN
INTRAMUSCULAR | Status: DC | PRN
  Administered 2011-05-27: 100 mg via INTRAVENOUS

## 2011-05-27 MED ORDER — HYDROMORPHONE HCL PF 1 MG/ML IJ SOLN
0.5000 mg | INTRAMUSCULAR | Status: DC | PRN
  Administered 2011-05-27 – 2011-05-28 (×3): 1 mg via INTRAVENOUS
  Filled 2011-05-27 (×3): qty 1

## 2011-05-27 MED ORDER — SODIUM CHLORIDE 0.9 % IR SOLN
Status: DC | PRN
  Administered 2011-05-27: 13:00:00

## 2011-05-27 MED ORDER — AMLODIPINE BESYLATE 2.5 MG PO TABS
2.5000 mg | ORAL_TABLET | Freq: Every day | ORAL | Status: DC
  Administered 2011-05-28: 2.5 mg via ORAL
  Filled 2011-05-27 (×2): qty 1

## 2011-05-27 MED ORDER — LETROZOLE 2.5 MG PO TABS
2.5000 mg | ORAL_TABLET | Freq: Every day | ORAL | Status: DC
  Administered 2011-05-28: 2.5 mg via ORAL
  Filled 2011-05-27 (×2): qty 1

## 2011-05-27 MED ORDER — LOSARTAN POTASSIUM 50 MG PO TABS
100.0000 mg | ORAL_TABLET | Freq: Every day | ORAL | Status: DC
  Administered 2011-05-27: 100 mg via ORAL
  Filled 2011-05-27 (×2): qty 2

## 2011-05-27 MED ORDER — GLYCOPYRROLATE 0.2 MG/ML IJ SOLN
INTRAMUSCULAR | Status: DC | PRN
  Administered 2011-05-27: .5 mg via INTRAVENOUS

## 2011-05-27 MED ORDER — BACITRACIN-NEOMYCIN-POLYMYXIN 400-5-5000 EX OINT
TOPICAL_OINTMENT | CUTANEOUS | Status: DC | PRN
  Administered 2011-05-27: 1 via TOPICAL

## 2011-05-27 MED ORDER — PROPOFOL 10 MG/ML IV BOLUS
INTRAVENOUS | Status: DC | PRN
  Administered 2011-05-27: 200 mg via INTRAVENOUS

## 2011-05-27 MED ORDER — HYDROMORPHONE HCL PF 1 MG/ML IJ SOLN
INTRAMUSCULAR | Status: AC
  Filled 2011-05-27: qty 2

## 2011-05-27 MED ORDER — BUPIVACAINE LIPOSOME 1.3 % IJ SUSP
20.0000 mL | Freq: Once | INTRAMUSCULAR | Status: DC
  Filled 2011-05-27: qty 20

## 2011-05-27 SURGICAL SUPPLY — 50 items
ANCH SUT 2 5.5 BABSR ASCP (Orthopedic Implant) ×2 IMPLANT
ANCHOR PEEK ZIP 5.5 NDL NO2 (Orthopedic Implant) ×2 IMPLANT
BAG SPEC THK2 15X12 ZIP CLS (MISCELLANEOUS) ×1
BAG ZIPLOCK 12X15 (MISCELLANEOUS) ×2 IMPLANT
BLADE OSCILLATING/SAGITTAL (BLADE) ×2
BLADE SW THK.38XMED LNG THN (BLADE) ×1 IMPLANT
BNDG COHESIVE 6X5 TAN NS LF (GAUZE/BANDAGES/DRESSINGS) IMPLANT
BUR OVAL CARBIDE 4.0 (BURR) ×2 IMPLANT
CLEANER TIP ELECTROSURG 2X2 (MISCELLANEOUS) ×2 IMPLANT
CLOTH BEACON ORANGE TIMEOUT ST (SAFETY) ×2 IMPLANT
DRAPE POUCH INSTRU U-SHP 10X18 (DRAPES) ×2 IMPLANT
DRSG EMULSION OIL 3X3 NADH (GAUZE/BANDAGES/DRESSINGS) ×2 IMPLANT
DRSG PAD ABDOMINAL 8X10 ST (GAUZE/BANDAGES/DRESSINGS) ×3 IMPLANT
DURAPREP 26ML APPLICATOR (WOUND CARE) ×2 IMPLANT
ELECT REM PT RETURN 9FT ADLT (ELECTROSURGICAL) ×2
ELECTRODE REM PT RTRN 9FT ADLT (ELECTROSURGICAL) ×1 IMPLANT
FLOSEAL 10ML (HEMOSTASIS) IMPLANT
GAUZE SPONGE 4X4 12PLY STRL LF (GAUZE/BANDAGES/DRESSINGS) ×1 IMPLANT
GLOVE BIO SURGEON STRL SZ 6.5 (GLOVE) ×2 IMPLANT
GLOVE BIOGEL PI IND STRL 8 (GLOVE) ×1 IMPLANT
GLOVE BIOGEL PI IND STRL 8.5 (GLOVE) ×1 IMPLANT
GLOVE BIOGEL PI INDICATOR 8 (GLOVE) ×1
GLOVE BIOGEL PI INDICATOR 8.5 (GLOVE)
GLOVE ECLIPSE 8.0 STRL XLNG CF (GLOVE) ×4 IMPLANT
GOWN PREVENTION PLUS LG XLONG (DISPOSABLE) ×4 IMPLANT
GOWN STRL REIN XL XLG (GOWN DISPOSABLE) ×4 IMPLANT
KIT BASIN OR (CUSTOM PROCEDURE TRAY) ×2 IMPLANT
MANIFOLD NEPTUNE II (INSTRUMENTS) ×2 IMPLANT
NDL MA TROC 1/2 (NEEDLE) IMPLANT
NEEDLE MA TROC 1/2 (NEEDLE) IMPLANT
NS IRRIG 1000ML POUR BTL (IV SOLUTION) IMPLANT
PACK SHOULDER CUSTOM OPM052 (CUSTOM PROCEDURE TRAY) ×2 IMPLANT
PASSER SUT SWANSON 36MM LOOP (INSTRUMENTS) IMPLANT
POSITIONER SURGICAL ARM (MISCELLANEOUS) ×2 IMPLANT
SLING ARM IMMOBILIZER LRG (SOFTGOODS) ×2 IMPLANT
SPONGE SURGIFOAM ABS GEL 100 (HEMOSTASIS) IMPLANT
STAPLER VISISTAT 35W (STAPLE) ×2 IMPLANT
STRIP CLOSURE SKIN 1/2X4 (GAUZE/BANDAGES/DRESSINGS) ×1 IMPLANT
SUCTION FRAZIER 12FR DISP (SUCTIONS) ×2 IMPLANT
SUT BONE WAX W31G (SUTURE) ×2 IMPLANT
SUT ETHIBOND NAB CT1 #1 30IN (SUTURE) ×2 IMPLANT
SUT MNCRL AB 4-0 PS2 18 (SUTURE) ×2 IMPLANT
SUT VIC AB 0 CT1 27 (SUTURE) ×4
SUT VIC AB 0 CT1 27XBRD ANTBC (SUTURE) ×1 IMPLANT
SUT VIC AB 1 CT1 27 (SUTURE) ×4
SUT VIC AB 1 CT1 27XBRD ANTBC (SUTURE) ×2 IMPLANT
SUT VIC AB 2-0 CT1 27 (SUTURE)
SUT VIC AB 2-0 CT1 27XBRD (SUTURE) IMPLANT
TAPE HYPAFIX 4 X10 (GAUZE/BANDAGES/DRESSINGS) ×1 IMPLANT
TOWEL OR 17X26 10 PK STRL BLUE (TOWEL DISPOSABLE) ×4 IMPLANT

## 2011-05-27 NOTE — Anesthesia Preprocedure Evaluation (Addendum)
Anesthesia Evaluation  Patient identified by MRN, date of birth, ID band Patient awake    Reviewed: Allergy & Precautions, H&P , NPO status , Patient's Chart, lab work & pertinent test results, reviewed documented beta blocker date and time   Airway Mallampati: III TM Distance: >3 FB Neck ROM: Full    Dental  (+) Dental Advisory Given   Pulmonary sleep apnea and Continuous Positive Airway Pressure Ventilation ,  clear to auscultation        Cardiovascular hypertension, Pt. on medications Regular Normal Given CV clearance   Neuro/Psych Negative Neurological ROS  Negative Psych ROS   GI/Hepatic negative GI ROS, Neg liver ROS,   Endo/Other  Diabetes mellitus-Morbid obesityDiet controlled  Renal/GU Elevated Cr 1.47  Genitourinary negative   Musculoskeletal negative musculoskeletal ROS (+)   Abdominal   Peds negative pediatric ROS (+)  Hematology negative hematology ROS (+)   Anesthesia Other Findings Upper front cap  Reproductive/Obstetrics negative OB ROS                          Anesthesia Physical Anesthesia Plan  ASA: III  Anesthesia Plan: General   Post-op Pain Management:    Induction: Intravenous  Airway Management Planned: Oral ETT  Additional Equipment:   Intra-op Plan:   Post-operative Plan: Extubation in OR  Informed Consent:   Dental advisory given  Plan Discussed with: CRNA and Surgeon  Anesthesia Plan Comments:         Anesthesia Quick Evaluation

## 2011-05-27 NOTE — Preoperative (Signed)
Beta Blockers   Reason not to administer Beta Blockers:Pt took Coreg this am 05-27-11

## 2011-05-27 NOTE — Brief Op Note (Signed)
05/27/2011  1:48 PM  PATIENT:  Vicki [name]  77 y.o. female  PRE-OPERATIVE DIAGNOSIS:  Right Shoulder Rotator Cuff Tear  POST-OPERATIVE DIAGNOSIS:  Right Shoulder Rotator Cuff Tear  PROCEDURE:  Procedure(s) (LRB): ROTATOR CUFF REPAIR SHOULDER OPEN (Right)  SURGEON:  Surgeon(s) and Role:    * Tobi Bastos, MD - Primary  PHYSICIAN ASSISTANT:Amber Constable PA     ANESTHESIA:   general  EBL:  Total I/O In: 500 [I.V.:500] Out: -   BLOOD ADMINISTERED:none  DRAINS: none   LOCAL MEDICATIONS USED:  BUPIVICAINE 20cc mixed with 20cc Normal Saline and only 20cc of the mixture was used.  SPECIMEN:  No Specimen  DISPOSITION OF SPECIMEN:  N/A  COUNTS:  YES  TOURNIQUET:  * No tourniquets in log *  DICTATION: .Other Dictation: Dictation Number [id number]  PLAN OF CARE: Admit for overnight observation  PATIENT DISPOSITION:  PACU - hemodynamically stable.   Delay start of Pharmacological VTE agent (>24hrs) due to surgical blood loss or risk of bleeding: yes

## 2011-05-27 NOTE — Interval H&P Note (Signed)
History and Physical Interval Note:  05/27/2011 11:21 AM  Vicki [name]  has presented today for surgery, with the diagnosis of Right Shoulder Rotator Cuff Tear  The various methods of treatment have been discussed with the patient and family. After consideration of risks, benefits and other options for treatment, the patient has consented to  Procedure(s) (LRB): ROTATOR CUFF REPAIR SHOULDER OPEN (Right) as a surgical intervention .  The patients' history has been reviewed, patient examined, no change in status, stable for surgery.  I have reviewed the patients' chart and labs.  Questions were answered to the patient's satisfaction.     Aylla Huffine A

## 2011-05-27 NOTE — Transfer of Care (Signed)
Immediate Anesthesia Transfer of Care Note  Patient: Vicki [name]  Procedure(s) Performed: Procedure(s) (LRB): ROTATOR CUFF REPAIR SHOULDER OPEN (Right)  Patient Location: PACU  Anesthesia Type: General  Level of Consciousness: awake, alert  and patient cooperative  Airway & Oxygen Therapy: Patient Spontanous Breathing and Patient connected to face mask oxygen  Post-op Assessment: Report given to PACU RN and Post -op Vital signs reviewed and stable  Post vital signs: Reviewed and stable  Complications: No apparent anesthesia complications

## 2011-05-27 NOTE — Anesthesia Postprocedure Evaluation (Signed)
  Anesthesia Post-op Note  Patient: Vicki [name]  Procedure(s) Performed: Procedure(s) (LRB): ROTATOR CUFF REPAIR SHOULDER OPEN (Right)  Patient Location: PACU  Anesthesia Type: General  Level of Consciousness: oriented and sedated  Airway and Oxygen Therapy: Patient Spontanous Breathing and Patient connected to nasal cannula oxygen  Post-op Pain: mild  Post-op Assessment: Post-op Vital signs reviewed, Patient's Cardiovascular Status Stable, Respiratory Function Stable and Patent Airway  Post-op Vital Signs: stable  Complications: No apparent anesthesia complications

## 2011-05-28 MED ORDER — METHOCARBAMOL 500 MG PO TABS: 500.0000 mg | ORAL_TABLET | Freq: Four times a day (QID) | ORAL | Status: AC | PRN

## 2011-05-28 MED ORDER — OXYCODONE-ACETAMINOPHEN 5-325 MG PO TABS: 1.0000 | ORAL_TABLET | ORAL | Status: AC | PRN

## 2011-05-28 NOTE — Progress Notes (Signed)
OT Note Eval completed and filed in shadow chart. All education completed. Pt will have necessary level of A by caregiver upon d/c. Pt presents with no further OT needs in acute. Will sign off.  Bernell List, OTR/L  Pager [phone] 05/28/2011

## 2011-05-28 NOTE — Progress Notes (Signed)
Subjective: Significant pain in shoulder this morning. Her circulation is intact in her hand.    Objective: Vital signs in last 24 hours: Temp:  [97.7 F (36.5 C)-99.5 F (37.5 C)] 97.9 F (36.6 C) (02/21 0557) Pulse Rate:  [56-73] 57  (02/21 0557) Resp:  [14-20] 18  (02/21 0557) BP: (121-180)/(50-92) 121/71 mmHg (02/21 0557) SpO2:  [94 %-100 %] 94 % (02/21 0557) Weight:  [111.131 kg (245 lb)] 111.131 kg (245 lb) (02/20 1645)  Intake/Output from previous day: 02/20 0701 - 02/21 0700 In: 2616 [P.O.:240; I.V.:2326; IV Piggyback:50] Out: 500 [Urine:500] Intake/Output this shift:     Basename 05/25/11 1115  HGB 11.8*    Basename 05/25/11 1115  WBC 8.3  RBC 4.40  HCT 36.2  PLT 268    Basename 05/25/11 1115  NA 139  K 3.5  CL 102  CO2 26  BUN 20  CREATININE 1.47*  GLUCOSE 104*  CALCIUM 9.4    Basename 05/25/11 1115  LABPT --  INR 1.06    Neurologically intact  Assessment/Plan: Will DC after seen by OT.   Dekota Shenk A 05/28/2011, 7:32 AM

## 2011-05-28 NOTE — Discharge Summary (Signed)
Physician Discharge Summary   Patient ID: Vicki [name] MRN: [id number] DOB/AGE: [birth year] 77 y.o.  Admit date: 05/27/2011 Discharge date: 05/28/2011  Primary Diagnosis: Right rotator cuff tear  Admission Diagnoses: Past Medical History  Diagnosis Date  . Bradycardia   . Chest pain   . Obesity   . Asthma     none in years  . Sleep apnea     states setting on 11- followed by Dr Westley Hummer yearly- last study 7 years ago  . GERD (gastroesophageal reflux disease)   . Arthritis     osteo  . Gout   . Chronic kidney disease     states elevated creatnine- followed by Dr Joylene Draft  . Breast cancer 2010    Left  . Complication of anesthesia     following tympanoplasty in 1997- "HAD TIGHTENING AROUND CHEST FOLLOWING ANESTHESIA'- states has done well x 2 since  . Hypertension     LOV, clearance Dr Cathie Olden with note 2/13 EPIC, EKG and Chest  10/12 EPIC  . Diabetes mellitus     borderline/diet controlled    Discharge Diagnoses:  Active Problems:  Complete rotator cuff tear or rupture of right shoulder S/p open right rotator cuff repair  Procedure: Procedure(s) (LRB): ROTATOR CUFF REPAIR SHOULDER OPEN (Right)   Consults: None  HPI: [name] has been seen by Dr. Gladstone Lighter for her right shoulder pain. She has had pain at night and pain with overhead motion. She denies a specific injury. MRI revealed large retracted tear of the right rotator cuff as well as arthritic changes of the right shoulder. Surgery is scheduled to decrease pain and increased function in the right shoulder.    Laboratory Data: Hospital Outpatient Visit on 05/25/2011  Component Date Value Range Status  . aPTT (seconds) 05/25/2011 50* 24-37 Final   Comment:                                 IF BASELINE aPTT IS ELEVATED,                          SUGGEST PATIENT RISK ASSESSMENT                          BE USED TO DETERMINE APPROPRIATE                          ANTICOAGULANT THERAPY.  . WBC (K/uL) 05/25/2011 8.3   4.0-10.5 Final  . RBC (MIL/uL) 05/25/2011 4.40  3.87-5.11 Final  . Hemoglobin (g/dL) 05/25/2011 11.8* 12.0-15.0 Final  . HCT (%) 05/25/2011 36.2  36.0-46.0 Final  . MCV (fL) 05/25/2011 82.3  78.0-100.0 Final  . MCH (pg) 05/25/2011 26.8  26.0-34.0 Final  . MCHC (g/dL) 05/25/2011 32.6  30.0-36.0 Final  . RDW (%) 05/25/2011 15.8* 11.5-15.5 Final  . Platelets (K/uL) 05/25/2011 268  150-400 Final  . Sodium (mEq/L) 05/25/2011 139  135-145 Final  . Potassium (mEq/L) 05/25/2011 3.5  3.5-5.1 Final  . Chloride (mEq/L) 05/25/2011 102  96-112 Final  . CO2 (mEq/L) 05/25/2011 26  19-32 Final  . Glucose, Bld (mg/dL) 05/25/2011 104* 70-99 Final  . BUN (mg/dL) 05/25/2011 20  6-23 Final  . Creatinine, Ser (mg/dL) 05/25/2011 1.47* 0.50-1.10 Final  . Calcium (mg/dL) 05/25/2011 9.4  8.4-10.5 Final  . Total Protein (g/dL) 05/25/2011 7.7  6.0-8.3 Final  .  Albumin (g/dL) 05/25/2011 3.8  3.5-5.2 Final  . AST (U/L) 05/25/2011 16  0-37 Final  . ALT (U/L) 05/25/2011 15  0-35 Final  . Alkaline Phosphatase (U/L) 05/25/2011 107  39-117 Final  . Total Bilirubin (mg/dL) 05/25/2011 0.2* 0.3-1.2 Final  . GFR calc non Af Amer (mL/min) 05/25/2011 34* >90 Final  . GFR calc Af Amer (mL/min) 05/25/2011 39* >90 Final   Comment:                                 The eGFR has been calculated                          using the CKD EPI equation.                          This calculation has not been                          validated in all clinical                          situations.                          eGFR's persistently                          <90 mL/min signify                          possible Chronic Kidney Disease.  Marland Kitchen Neutrophils Relative (%) 05/25/2011 60  43-77 Final  . Neutro Abs (K/uL) 05/25/2011 5.0  1.7-7.7 Final  . Lymphocytes Relative (%) 05/25/2011 31  12-46 Final  . Lymphs Abs (K/uL) 05/25/2011 2.6  0.7-4.0 Final  . Monocytes Relative (%) 05/25/2011 8  3-12 Final  . Monocytes Absolute (K/uL)  05/25/2011 0.7  0.1-1.0 Final  . Eosinophils Relative (%) 05/25/2011 1  0-5 Final  . Eosinophils Absolute (K/uL) 05/25/2011 0.1  0.0-0.7 Final  . Basophils Relative (%) 05/25/2011 1  0-1 Final  . Basophils Absolute (K/uL) 05/25/2011 0.0  0.0-0.1 Final  . Prothrombin Time (seconds) 05/25/2011 14.0  11.6-15.2 Final  . INR  05/25/2011 1.06  0.00-1.49 Final  . Color, Urine  05/25/2011 YELLOW  YELLOW Final  . APPearance  05/25/2011 CLEAR  CLEAR Final  . Specific Gravity, Urine  05/25/2011 1.021  1.005-1.030 Final  . pH  05/25/2011 5.5  5.0-8.0 Final  . Glucose, UA (mg/dL) 05/25/2011 NEGATIVE  NEGATIVE Final  . Hgb urine dipstick  05/25/2011 NEGATIVE  NEGATIVE Final  . Bilirubin Urine  05/25/2011 NEGATIVE  NEGATIVE Final  . Ketones, ur (mg/dL) 05/25/2011 NEGATIVE  NEGATIVE Final  . Protein, ur (mg/dL) 05/25/2011 NEGATIVE  NEGATIVE Final  . Urobilinogen, UA (mg/dL) 05/25/2011 0.2  0.0-1.0 Final  . Nitrite  05/25/2011 NEGATIVE  NEGATIVE Final  . Leukocytes, UA  05/25/2011 SMALL* NEGATIVE Final  . MRSA, PCR  05/25/2011 NEGATIVE  NEGATIVE Final  . Staphylococcus aureus  05/25/2011 NEGATIVE  NEGATIVE Final   Comment:                                 The  Xpert SA Assay (FDA                          approved for NASAL specimens                          only), is one component of                          a comprehensive surveillance                          program.  It is not intended                          to diagnose infection nor to                          guide or monitor treatment.  . Squamous Epithelial / LPF  05/25/2011 RARE  RARE Final  . WBC, UA (WBC/hpf) 05/25/2011 7-10  <3 Final  . Bacteria, UA  05/25/2011 FEW* RARE Final  . Urine-Other  05/25/2011 MUCOUS PRESENT   Final    EKG: Orders placed during the hospital encounter of 01/16/11  . EKG     Hospital Course: Patient was admitted to [facility] and taken to the OR and underwent the above state procedure without  complications.  Patient tolerated the procedure well and was later transferred to the recovery room and then to the floor for postoperative care.  They were given PO and IV analgesics for pain control following their surgery. Discharge planning consulted to help with postop disposition and equipment needs.  Patient had a rough night on the evening of surgery due to significant right shoulder pain. Patient worked with PT/OT on day one after surgery. Patient was seen in rounds and was ready to go home.    Discharge Medications: Prior to Admission medications   Medication Sig Start Date End Date Taking? Authorizing Provider  allopurinol (ZYLOPRIM) 300 MG tablet Take 150 mg by mouth daily.   Yes Historical Provider, MD  amLODipine (NORVASC) 2.5 MG tablet Take 2.5 mg by mouth daily. States takes 1100 AM   Yes Historical Provider, MD  aspirin 81 MG tablet Take 81 mg by mouth every other day.    Yes Historical Provider, MD  carvedilol (COREG) 25 MG tablet Take 25 mg by mouth 2 (two) times daily with a meal.   Yes Historical Provider, MD  hydrochlorothiazide 25 MG tablet Take 25 mg by mouth daily at 12 noon.    Yes Historical Provider, MD  Letrozole (FEMARA PO) Take 2.5 mg by mouth daily.    Yes Historical Provider, MD  losartan (COZAAR) 100 MG tablet Take 100 mg by mouth at bedtime.   Yes Historical Provider, MD  rosuvastatin (CRESTOR) 20 MG tablet Take 5 mg by mouth every evening.    Yes Historical Provider, MD  Calcium Carbonate-Vitamin D (CALCIUM + D PO) Take 600 Units by mouth daily.     Historical Provider, MD  Cholecalciferol (VITAMIN D) 1000 UNITS capsule Take 1,000 Units by mouth daily.     Historical Provider, MD  Coenzyme Q10 (CO Q 10 PO) Take 1 tablet by mouth daily.     Historical Provider, MD  colchicine 0.6 MG tablet Take 0.6 mg by mouth  daily as needed. GOUT     Historical Provider, MD  methocarbamol (ROBAXIN) 500 MG tablet Take 1 tablet (500 mg total) by mouth every 6 (six) hours as  needed. 05/28/11 06/07/11  Brayant Dorr Renelda Loma, PA  Multiple Vitamin (MULTIVITAMIN) tablet Take 1 tablet by mouth daily.     Historical Provider, MD  oxyCODONE-acetaminophen (PERCOCET) 5-325 MG per tablet Take 1-2 tablets by mouth every 4 (four) hours as needed. 05/28/11 06/07/11  Julianne Chamberlin Renelda Loma, PA  promethazine-codeine (PHENERGAN WITH CODEINE) 6.25-10 MG/5ML syrup Take 5 mLs by mouth every 4 (four) hours as needed. COUGH     Historical Provider, MD  telmisartan (MICARDIS) 80 MG tablet Take 80 mg by mouth at bedtime. States Dr Letta Median changed therapy    Historical Provider, MD  traMADol (ULTRAM) 50 MG tablet Take 50 mg by mouth every 6 (six) hours as needed. PAIN     Historical Provider, MD    Diet: regular  Activity: Keep sling on right shoulder at all times  Follow-up:in 1 week  Disposition: Home  Discharged Condition: good   Discharge Orders    Future Appointments: Provider: Department: Dept Phone: Center:   07/09/2011 1:15 PM [facility] [phone] None   07/16/2011 11:15 AM Amy Gwenlyn Found, PA [facility] [phone] None     Future Orders Please Complete By Expires   Diet Carb Modified      Call MD / Call 911      Comments:   If you experience chest pain or shortness of breath, CALL 911 and be transported to the hospital emergency room.  If you develope a fever above 101 F, pus (white drainage) or increased drainage or redness at the wound, or calf pain, call your surgeon's office.   Constipation Prevention      Comments:   Drink plenty of fluids.  Prune juice may be helpful.  You may use a stool softener, such as Colace (over the counter) 100 mg twice a day.  Use MiraLax (over the counter) for constipation as needed.   Discharge instructions      Comments:   Keep your sling on at all times, including sleeping in your sling.  The only time you should remove your sling is to shower only but you need to keep your hand against your chest while you shower.    For the first few days, remove your dressing, tape a piece of saran wrap over your incision, take your shower, then remove the saran wrap and put a clean dressing on, then reapply your sling.  After two days you can shower without the saran wrap.   Call Dr. Gladstone Lighter if any wound complications or temperature of 101 degrees F or over.   Call the office for an appointment to see Dr. Gladstone Lighter in two weeks: [phone] and ask for Dr. Charlestine Night nurse, Brunilda Payor.   Driving restrictions      Comments:   No driving   Lifting restrictions      Comments:   No lifting     Medication List  As of 05/28/2011  4:14 PM   TAKE these medications         allopurinol 300 MG tablet   Commonly known as: ZYLOPRIM   Take 150 mg by mouth daily.      amLODipine 2.5 MG tablet   Commonly known as: NORVASC   Take 2.5 mg by mouth daily. States takes 1100 AM      aspirin 81 MG tablet  Take 81 mg by mouth every other day.      CALCIUM + D PO   Take 600 Units by mouth daily.      carvedilol 25 MG tablet   Commonly known as: COREG   Take 25 mg by mouth 2 (two) times daily with a meal.      CO Q 10 PO   Take 1 tablet by mouth daily.      colchicine 0.6 MG tablet   Take 0.6 mg by mouth daily as needed. GOUT        FEMARA PO   Take 2.5 mg by mouth daily.      hydrochlorothiazide 25 MG tablet   Commonly known as: HYDRODIURIL   Take 25 mg by mouth daily at 12 noon.      losartan 100 MG tablet   Commonly known as: COZAAR   Take 100 mg by mouth at bedtime.      methocarbamol 500 MG tablet   Commonly known as: ROBAXIN   Take 1 tablet (500 mg total) by mouth every 6 (six) hours as needed.      multivitamin tablet   Take 1 tablet by mouth daily.      oxyCODONE-acetaminophen 5-325 MG per tablet   Commonly known as: PERCOCET   Take 1-2 tablets by mouth every 4 (four) hours as needed.      promethazine-codeine 6.25-10 MG/5ML syrup   Commonly known as: PHENERGAN with CODEINE   Take 5 mLs  by mouth every 4 (four) hours as needed. COUGH        rosuvastatin 20 MG tablet   Commonly known as: CRESTOR   Take 5 mg by mouth every evening.      telmisartan 80 MG tablet   Commonly known as: MICARDIS   Take 80 mg by mouth at bedtime. States Dr Letta Median changed therapy      traMADol 50 MG tablet   Commonly known as: ULTRAM   Take 50 mg by mouth every 6 (six) hours as needed. PAIN        Vitamin D 1000 UNITS capsule   Take 1,000 Units by mouth daily.             SignedCecilio Asper, Jeremias Broyhill LAUREN 05/28/2011, 4:14 PM

## 2011-05-28 NOTE — Op Note (Signed)
Vicki [name], Vicki [name]               ACCOUNT NO.:  [id number]  MEDICAL RECORD NO.:  [id number]  LOCATION:  52                         FACILITY:  [facility]  PHYSICIAN:  Kipp Brood. Rue Valladares, M.D.DATE OF BIRTH:  [birth year]  DATE OF PROCEDURE:  05/27/2011 DATE OF DISCHARGE:                              OPERATIVE REPORT   SURGEON:  Kipp Brood. Islam Eichinger, M.D.  ASSISTANT:  Ardeen Jourdain, [place]  PREOPERATIVE DIAGNOSIS:  Complete rotator cuff tear with severe retraction and calcification.  POSTOPERATIVE DIAGNOSIS:  Complete rotator cuff tear with severe retraction and calcification.  OPERATION: 1. Open acromionectomy on a highlighted complex rotator cuff tear with     complete retraction and calcification. 2. Severe impingement syndrome, both right shoulder.  PROCEDURE:  Under general anesthesia, routine orthopedic prepping and draping the right upper extremity was carried out.  She had 1 g of IV Ancef.  Noted at this time, the appropriate time-out was carried out before any incisions were made and I also marked the appropriate right arm in the holding area.  Note, this was a complex highlighted case. She was extremely overweight as well.  Incision was made over the anterior aspect of the right shoulder.  Bleeders identified and cauterized.  Self-retaining retractors were inserted.  I then detached by sharp dissection, the rotator cuff from the acromion.  I then split a small proximal part of the deltoid tendon.  Immediately upon going down into the shoulder she had a highlighted severe marked overgrowth and thickened acromion with severe downsloping.  I then protected the underlying rotator cuff, and then utilized the oscillating saw and the burr to do an acromionectomy.  Note, she had marked spur formation of the humeral head as well.  The rotator cuff was completely ripped off the bone and it was retracted proximally and the end of the cup was ossified.  I then utilized the burr to even out  the undersurface of the acromion.  I thoroughly irrigated that area and I bone waxed the acromion.  Following that, I then went down and burred the lateral articular surface of the humerus.  I then went searching for the rotator cuff and it was severely retracted.  I first identified the long head of the biceps which was intact, identified the cup, brought it forward as much as I could.  I utilized the gloved finger to free it up and I then sutured the proximal part into the biceps tendon.  Once this was done, I was able to hold the tendon more firmly anteriorly and I then inserted 2 anchors in the bone and brought anchors up through complete the repair of the tendon.  Note this was an extremely difficult case and the very end of the tendon was ossified and I felt that perhaps that we left that ossific side portion alone and tacked it down to the bleeding bone that we may get a better repair.  I thoroughly irrigated out the area and reapproximated deltoid tendon muscle in usual fashion.  The subcutaneous was closed with 0 Vicryl and skin with metal staples.  Sterile dressing was applied.  She is going to be placed in a shoulder immobilizer.  I injected  20 cc of a mixture of 20 cc of Exparel with 20 cc of saline with a total 40 cc where we are only able to use 20.          ______________________________ Kipp Brood. Gladstone Lighter, M.D.     RAG/MEDQ  D:  05/27/2011  T:  05/28/2011  Job:  [id number]

## 2011-06-08 ENCOUNTER — Encounter (HOSPITAL_COMMUNITY): Payer: Self-pay | Admitting: Orthopedic Surgery

## 2011-07-09 ENCOUNTER — Telehealth: Payer: Self-pay | Admitting: Oncology

## 2011-07-09 ENCOUNTER — Other Ambulatory Visit: Payer: Medicare Other | Admitting: Lab

## 2011-07-09 NOTE — Telephone Encounter (Signed)
Pt called to r/s her lab appt.lmonvm adviisng the pt that her lab appt has been r/s to 07/10/2011@1 :15pm

## 2011-07-10 ENCOUNTER — Other Ambulatory Visit: Payer: Medicare Other | Admitting: Lab

## 2011-07-14 ENCOUNTER — Other Ambulatory Visit: Payer: Medicare Other | Admitting: Lab

## 2011-07-14 ENCOUNTER — Other Ambulatory Visit (HOSPITAL_BASED_OUTPATIENT_CLINIC_OR_DEPARTMENT_OTHER): Payer: Medicare Other

## 2011-07-14 DIAGNOSIS — C50219 Malignant neoplasm of upper-inner quadrant of unspecified female breast: Secondary | ICD-10-CM

## 2011-07-14 DIAGNOSIS — Z170 Estrogen receptor positive status [ER+]: Secondary | ICD-10-CM

## 2011-07-14 DIAGNOSIS — D649 Anemia, unspecified: Secondary | ICD-10-CM

## 2011-07-14 DIAGNOSIS — N189 Chronic kidney disease, unspecified: Secondary | ICD-10-CM

## 2011-07-14 LAB — COMPREHENSIVE METABOLIC PANEL
ALT: 14 U/L (ref 0–35)
Albumin: 3.9 g/dL (ref 3.5–5.2)
CO2: 24 mEq/L (ref 19–32)
Calcium: 9 mg/dL (ref 8.4–10.5)
Chloride: 104 mEq/L (ref 96–112)
Creatinine, Ser: 1.4 mg/dL — ABNORMAL HIGH (ref 0.50–1.10)
Potassium: 3.8 mEq/L (ref 3.5–5.3)
Total Protein: 6.5 g/dL (ref 6.0–8.3)

## 2011-07-14 LAB — CBC WITH DIFFERENTIAL/PLATELET
Eosinophils Absolute: 0.1 10*3/uL (ref 0.0–0.5)
HCT: 31.9 % — ABNORMAL LOW (ref 34.8–46.6)
LYMPH%: 25.8 % (ref 14.0–49.7)
MONO#: 0.5 10*3/uL (ref 0.1–0.9)
NEUT#: 5.6 10*3/uL (ref 1.5–6.5)
Platelets: 242 10*3/uL (ref 145–400)
RBC: 3.83 10*6/uL (ref 3.70–5.45)
WBC: 8.6 10*3/uL (ref 3.9–10.3)
lymph#: 2.2 10*3/uL (ref 0.9–3.3)
nRBC: 0 % (ref 0–0)

## 2011-07-16 ENCOUNTER — Encounter: Payer: Self-pay | Admitting: Physician Assistant

## 2011-07-16 ENCOUNTER — Other Ambulatory Visit: Payer: Medicare Other | Admitting: Lab

## 2011-07-16 ENCOUNTER — Telehealth: Payer: Self-pay | Admitting: *Deleted

## 2011-07-16 ENCOUNTER — Ambulatory Visit (HOSPITAL_BASED_OUTPATIENT_CLINIC_OR_DEPARTMENT_OTHER): Payer: Medicare Other | Admitting: Physician Assistant

## 2011-07-16 VITALS — BP 164/67 | HR 59 | Temp 98.2°F | Ht 65.0 in | Wt 248.7 lb

## 2011-07-16 DIAGNOSIS — Z170 Estrogen receptor positive status [ER+]: Secondary | ICD-10-CM

## 2011-07-16 DIAGNOSIS — Z79811 Long term (current) use of aromatase inhibitors: Secondary | ICD-10-CM

## 2011-07-16 DIAGNOSIS — Z853 Personal history of malignant neoplasm of breast: Secondary | ICD-10-CM

## 2011-07-16 DIAGNOSIS — C50919 Malignant neoplasm of unspecified site of unspecified female breast: Secondary | ICD-10-CM

## 2011-07-16 MED ORDER — LETROZOLE 2.5 MG PO TABS: 2.5000 mg | ORAL_TABLET | Freq: Every day | ORAL | Status: AC

## 2011-07-16 NOTE — Telephone Encounter (Signed)
gave patient appointment for 07-05-2012 with the lab one week before the md appointment

## 2011-07-16 NOTE — Progress Notes (Signed)
ID: Vicki [name]   DOB: [birth year]  MR#: [id number]  [id number]  HISTORY OF PRESENT ILLNESS: The patient had routine screening mammography 06/12/2008 showing a possible mass in the left breast.  On 06/19/2008 she had diagnostic mammograms with left ultrasound.  Compression views confirmed a small mass with spiculated margins in the left breast, which Dr. Alyson Ingles was unable to palpate.  Ultrasound measured this at 6 mm.  It was hypoechoic and irregular.  Biopsy was performed the same day, and showed ([id number] and [id number]) an invasive ductal carcinoma which appeared to be low to intermediate grade, 100% ER positive, 99% PR positive, 13% on the MIB-1 with HER-2 positive by CISH with a ratio of 2.13.  The patient was referred for MRI, but was unable to obtain it because of renal insufficiency, so Dr. Margot Chimes proceeded directly to left lumpectomy and sentinel lymph node biopsy on 07/10/2008.  That report ([id number]) confirmed a 1.2 cm invasive ductal carcinoma, grade 2, with negative margins, no evidence of lymphovascular invasion, and 0 of 1 sentinel lymph node involved.  The patient was then referred to Dr. Sondra Come.  He felt she would be a good candidate for MammoSite radiation, and after insertion of the balloon, she proceeded to receive brachytherapy completed 04/15, for a cumulative dose to the operative cavity of 34 Gy.    Following completion of radiation, the patient began on letrozole in May of 2010 and continues to 2.5 mg daily with good tolerance.  INTERVAL HISTORY: Vicki [name] returns today for routine six-month followup of her left breast carcinoma. Interval history is remarkable for 2 shoulder surgeries since her visit here in September. In October, she had her left rotator cuff repaired. She had her right shoulder repaired in February, and is still recovering from the most recent surgery. She tolerated surgery well, and has had no complications. I will note that she missed both her bone  density in October and her mammogram in March due to the surgeries, and those are in the process of being rescheduled.  She has continued on the letrozole which she is tolerating very well.  REVIEW OF SYSTEMS: Vicki [name] denies any significant hot flashes. No vaginal dryness. She has some joint pain associated with arthritis, but no increased associated with the letrozole. She's had no recent illnesses and denies fevers, chills, or night sweats. No new nausea or change in bowel habits. No chest pain or shortness of breath. No abnormal headaches, dizziness, change in vision.  A detailed review of systems is otherwise noncontributory.   PAST MEDICAL HISTORY: Past Medical History  Diagnosis Date  . Bradycardia   . Chest pain   . Obesity   . Asthma     none in years  . Sleep apnea     states setting on 11- followed by Dr Westley Hummer yearly- last study 7 years ago  . GERD (gastroesophageal reflux disease)   . Arthritis     osteo  . Gout   . Chronic kidney disease     states elevated creatnine- followed by Dr Joylene Draft  . Breast cancer 2010    Left  . Complication of anesthesia     following tympanoplasty in 1997- "HAD TIGHTENING AROUND CHEST FOLLOWING ANESTHESIA'- states has done well x 2 since  . Hypertension     LOV, clearance Dr Cathie Olden with note 2/13 EPIC, EKG and Chest  10/12 EPIC  . Diabetes mellitus     borderline/diet controlled    PAST SURGICAL HISTORY: Past Surgical History  Procedure Date  . Breast lumpectomy 07/2008    Left - Dr Margot Chimes  . Tonsillectomy   . Tympanoplasty 1997    right  . Carpal tunnel release     right  . Shoulder open rotator cuff repair 05/27/2011    Procedure: ROTATOR CUFF REPAIR SHOULDER OPEN;  Surgeon: Tobi Bastos, MD;  Location: [facility];  Service: Orthopedics;  Laterality: Right;  Right Shoulder Rotator Cuff Repair complex      FAMILY HISTORY No family history on file. The patient's maternal grandmother died from postmenopausal breast  cancer.  One of the patient's mothers four sisters also had breast cancer, also postmenopausal.  The patient's father, who had an alcohol problem and was a smoker, died at the age of 73.  The patient's mother died in the setting of dementia with renal failure and diabetes at the age of 43.  The patient had 6 sisters and 3 brothers.  One sister had breast cancer diagnosed in her late 29s.  Another sister may have had cancer of the uterus.  It is not clear to the patient.  There is no other cancer in the family that she is aware, although the patient's mother did have a "malignant colon polyp" removed.  GYNECOLOGIC HISTORY:  The patient is GX, P6.  First pregnancy to term, age 77.  She received hormones for 2-3 years after menopause in her late 0000000 with no complications.  SOCIAL HISTORY:    ADVANCED DIRECTIVES:  HEALTH MAINTENANCE: History  Substance Use Topics  . Smoking status: Former Smoker    Quit date: 04/06/1961  . Smokeless tobacco: Not on file  . Alcohol Use: No     Colonoscopy:  PAP:  Bone density:2010  Lipid panel:  Allergies  Allergen Reactions  . Protonix Palpitations  . Sulfur Swelling and Rash    Current Outpatient Prescriptions  Medication Sig Dispense Refill  . allopurinol (ZYLOPRIM) 300 MG tablet Take 150 mg by mouth daily.      Marland Kitchen amLODipine (NORVASC) 2.5 MG tablet Take 2.5 mg by mouth daily. States takes 1100 AM      . aspirin 81 MG tablet Take 81 mg by mouth every other day.       . Calcium Carbonate-Vitamin D (CALCIUM + D PO) Take 600 Units by mouth daily.       . carvedilol (COREG) 25 MG tablet Take 25 mg by mouth 2 (two) times daily with a meal.      . Cholecalciferol (VITAMIN D) 1000 UNITS capsule Take 1,000 Units by mouth daily.       . Coenzyme Q10 (CO Q 10 PO) Take 1 tablet by mouth daily.       . hydrochlorothiazide 25 MG tablet Take 25 mg by mouth daily at 12 noon.       Marland Kitchen HYDROcodone-acetaminophen (NORCO) 5-325 MG per tablet Take 1 tablet by mouth  every 8 (eight) hours as needed.      Marland Kitchen losartan (COZAAR) 100 MG tablet Take 100 mg by mouth at bedtime.      . Multiple Vitamin (MULTIVITAMIN) tablet Take 1 tablet by mouth daily.       . promethazine-codeine (PHENERGAN WITH CODEINE) 6.25-10 MG/5ML syrup Take 5 mLs by mouth every 4 (four) hours as needed. COUGH       . rosuvastatin (CRESTOR) 20 MG tablet Take 5 mg by mouth every evening.       . traMADol (ULTRAM) 50 MG tablet Take 50 mg by mouth every 6 (  six) hours as needed. PAIN       . colchicine 0.6 MG tablet Take 0.6 mg by mouth daily as needed. GOUT       . letrozole (FEMARA) 2.5 MG tablet Take 1 tablet (2.5 mg total) by mouth daily.  90 tablet  3    OBJECTIVE: Filed Vitals:   07/16/11 1130  BP: 164/67  Pulse: 59  Temp: 98.2 F (36.8 C)     Body mass index is 41.39 kg/(m^2).    ECOG FS: 1  Physical Exam: HEENT:  Sclerae anicteric, conjunctivae pink.  Oropharynx clear.  No mucositis or candidiasis.   Nodes:  No cervical, supraclavicular, or axillary lymphadenopathy palpated.  Breast Exam:  Left breast is status post lumpectomy with no specific nodularity or skin changes. No evidence of local recurrence. Right breast is benign, no masses, discharge, nipple inversion, or skin changes. Lungs:  Clear to auscultation bilaterally.  No crackles, rhonchi, or wheezes.   Heart:  Regular rate and rhythm.   Abdomen:  Soft, obese, nontender.  Positive bowel sounds.  No organomegaly or masses palpated.   Musculoskeletal:  No focal spinal tenderness to palpation. Limited range of motion in her shoulders, or limited in the right than the left secondary to recent surgery. Extremities:  Benign.  No peripheral edema or cyanosis.   Skin:  Benign.   Neuro:  Nonfocal.    LAB RESULTS: Lab Results  Component Value Date   WBC 8.6 07/14/2011   NEUTROABS 5.6 07/14/2011   HGB 10.4* 07/14/2011   HCT 31.9* 07/14/2011   MCV 83.3 07/14/2011   PLT 242 07/14/2011      Chemistry      Component Value  Date/Time   NA 140 07/14/2011 0928   K 3.8 07/14/2011 0928   CL 104 07/14/2011 0928   CO2 24 07/14/2011 0928   BUN 27* 07/14/2011 0928   CREATININE 1.40* 07/14/2011 0928      Component Value Date/Time   CALCIUM 9.0 07/14/2011 0928   ALKPHOS 87 07/14/2011 0928   AST 15 07/14/2011 0928   ALT 14 07/14/2011 0928   BILITOT 0.3 07/14/2011 0928       Lab Results  Component Value Date   LABCA2 30 12/26/2010   LABCA2 30 12/26/2010   LABCA2 30 12/26/2010    STUDIES: No results found. Patient is in the process of rescheduling her bone density and her mammogram. These were missed as noted above due to her recent surgeries.  ASSESSMENT: A 77 year old [place] woman   (1)  status post left lumpectomy and sentinel lymph node sampling in April 2010 for a T1c N0, grade 2 invasive ductal carcinoma, estrogen and progesterone receptor-positive, HER-2 also amplified,   (2)  status post MammoSite radiation, completed in April 2010   (3)  on letrozole since May 2010 with good tolerance.   PLAN: With regards to her breast cancer, Vicki [name] appears to be doing well, and there is no clinical evidence of disease recurrence at this time. She'll continue on letrozole which I have refilled today, the goal being to complete a total of 5 years. Dr. Jana Hakim will see her again in one year for routine followup, but she will call prior that time with any changes or problems.  Yarnell Kozloski    07/16/2011

## 2011-11-19 ENCOUNTER — Other Ambulatory Visit: Payer: Self-pay | Admitting: Oncology

## 2011-11-19 DIAGNOSIS — Z853 Personal history of malignant neoplasm of breast: Secondary | ICD-10-CM

## 2011-11-19 DIAGNOSIS — Z9889 Other specified postprocedural states: Secondary | ICD-10-CM

## 2011-12-01 ENCOUNTER — Ambulatory Visit
Admission: RE | Admit: 2011-12-01 | Discharge: 2011-12-01 | Disposition: A | Discharge status: Home / Self Care | Payer: Medicare Other | Source: Ambulatory Visit | Attending: Oncology | Admitting: Oncology

## 2011-12-01 DIAGNOSIS — Z853 Personal history of malignant neoplasm of breast: Secondary | ICD-10-CM

## 2011-12-01 DIAGNOSIS — Z9889 Other specified postprocedural states: Secondary | ICD-10-CM

## 2012-03-24 ENCOUNTER — Other Ambulatory Visit: Payer: Self-pay | Admitting: Cardiovascular Disease

## 2012-03-24 MED ORDER — CARVEDILOL 25 MG PO TABS: 25.0000 mg | ORAL_TABLET | Freq: Two times a day (BID) | ORAL | Status: DC

## 2012-03-25 ENCOUNTER — Telehealth: Payer: Self-pay | Admitting: Cardiovascular Disease

## 2012-03-25 MED ORDER — CARVEDILOL 12.5 MG PO TABS: 12.5000 mg | ORAL_TABLET | Freq: Two times a day (BID) | ORAL | Status: DC

## 2012-03-25 NOTE — Telephone Encounter (Signed)
MAR reviewed, hospital made a med change 05/2011  but pt has continued to take carvedilol 12.5 bid, pt states she is unaware of a med increase, MAR adjusted and app was made to see Dr Acie Fredrickson, pt agreed to plan. Pharmacy was called to update them.

## 2012-03-25 NOTE — Telephone Encounter (Signed)
Pt needed refill on coreg 12.5mg  BID but yesterday the order came across as 25mg  BID and they want to confirm because she has been on the 12.5mg  for years. Pt needs meds today she is out.

## 2012-05-05 ENCOUNTER — Ambulatory Visit: Payer: Medicare Other | Admitting: Cardiovascular Disease

## 2012-05-30 ENCOUNTER — Ambulatory Visit (INDEPENDENT_AMBULATORY_CARE_PROVIDER_SITE_OTHER): Payer: Medicare Other | Admitting: Cardiovascular Disease

## 2012-05-30 ENCOUNTER — Encounter: Payer: Self-pay | Admitting: Cardiovascular Disease

## 2012-05-30 VITALS — BP 148/69 | HR 70 | Wt 261.8 lb

## 2012-05-30 DIAGNOSIS — R001 Bradycardia, unspecified: Secondary | ICD-10-CM

## 2012-05-30 DIAGNOSIS — I498 Other specified cardiac arrhythmias: Secondary | ICD-10-CM

## 2012-05-30 DIAGNOSIS — I10 Essential (primary) hypertension: Secondary | ICD-10-CM

## 2012-05-30 NOTE — Patient Instructions (Addendum)
Your physician wants you to follow-up in: [place] will receive a reminder letter in the mail two months in advance. If you don't receive a letter, please call our office to schedule the follow-up appointment.   Your physician recommends that you continue on your current medications as directed. Please refer to the Current Medication list given to you today.  REDUCE HIGH SODIUM FOODS LIKE CANNED SOUP, GRAVY, SAUCES, READY PREPARED FOODS LIKE FROZEN FOODS; LEAN CUISINE, LASAGNA. BACON, SAUSAGE, LUNCH MEAT, FAST FOODS.Marland Kitchen  DASH Diet The DASH diet stands for "Dietary Approaches to Stop Hypertension." It is a healthy eating plan that has been shown to reduce high blood pressure (hypertension) in as little as 14 days, while also possibly providing other significant health benefits. These other health benefits include reducing the risk of breast cancer after menopause and reducing the risk of type 2 diabetes, heart disease, colon cancer, and stroke. Health benefits also include weight loss and slowing kidney failure in patients with chronic kidney disease.  DIET GUIDELINES  Limit salt (sodium). Your diet should contain less than 1500 mg of sodium daily.  Limit refined or processed carbohydrates. Your diet should include mostly whole grains. Desserts and added sugars should be used sparingly.  Include small amounts of heart-healthy fats. These types of fats include nuts, oils, and tub margarine. Limit saturated and trans fats. These fats have been shown to be harmful in the body. CHOOSING FOODS  The following food groups are based on a 2000 calorie diet. See your Registered Dietitian for individual calorie needs. Grains and Grain Products (6 to 8 servings daily)  Eat More Often: Whole-wheat bread, brown rice, whole-grain or wheat pasta, quinoa, popcorn without added fat or salt (air popped).  Eat Less Often: White bread, white pasta, white rice, cornbread. Vegetables (4 to 5 servings daily)  Eat  More Often: Fresh, frozen, and canned vegetables. Vegetables may be raw, steamed, roasted, or grilled with a minimal amount of fat.  Eat Less Often/Avoid: Creamed or fried vegetables. Vegetables in a cheese sauce. Fruit (4 to 5 servings daily)  Eat More Often: All fresh, canned (in natural juice), or frozen fruits. Dried fruits without added sugar. One hundred percent fruit juice ( cup [237 mL] daily).  Eat Less Often: Dried fruits with added sugar. Canned fruit in light or heavy syrup. [employer], Fish, and Poultry (2 servings or less daily. One serving is 3 to 4 oz [85-114 g]).  Eat More Often: Ninety percent or leaner ground beef, tenderloin, sirloin. Round cuts of beef, chicken breast, [place] breast. All fish. Grill, bake, or broil your meat. Nothing should be fried.  Eat Less Often/Avoid: Fatty cuts of meat, [place], or chicken leg, thigh, or wing. Fried cuts of meat or fish. Dairy (2 to 3 servings)  Eat More Often: Low-fat or fat-free milk, low-fat plain or light yogurt, reduced-fat or part-skim cheese.  Eat Less Often/Avoid: Milk (whole, 2%).Whole milk yogurt. Full-fat cheeses. Nuts, Seeds, and Legumes (4 to 5 servings per week)  Eat More Often: All without added salt.  Eat Less Often/Avoid: Salted nuts and seeds, canned beans with added salt. Fats and Sweets (limited)  Eat More Often: Vegetable oils, tub margarines without trans fats, sugar-free gelatin. Mayonnaise and salad dressings.  Eat Less Often/Avoid: Coconut oils, palm oils, butter, stick margarine, cream, half and half, cookies, candy, pie. FOR MORE INFORMATION The Dash Diet Eating Plan: www.dashdiet.org Document Released: 03/12/2011 Document Revised: 06/15/2011 Document Reviewed: 03/12/2011 [facility] Patient Information 2013 [employer].  

## 2012-05-30 NOTE — Progress Notes (Signed)
Vicki [name] Date of Birth  October 14, 1934 Vicki [name] [address]    [place], [name], [name] [phone]  Fax  [phone]  [phone]  Fax [phone]  Problem list: 1. Hypertension 2. History of breast cancer 3. Shortness of breath 4. Leg edema   History of Present Illness:  [name] is a 78 yo with a hx of HTN, breast cancer.  She presents earlier than expected to today for pre-op evaluation prior to having right shoulder surgery.  She injured her right shoulder years ago.  She fell recently and injured her left shoulder ( which has already been reapired.)  Denies any chest pain or dyspnea.  She exercises on an intermittant basis.  Feb. 24, 2014:  She has been having some more leg edema.  She is still eating salt.   Current Outpatient Prescriptions on File Prior to Visit  Medication Sig Dispense Refill  . allopurinol (ZYLOPRIM) 300 MG tablet Take 150 mg by mouth daily.      Marland Kitchen amLODipine (NORVASC) 2.5 MG tablet Take 2.5 mg by mouth daily. States takes 1100 AM      . aspirin 81 MG tablet Take 81 mg by mouth every other day.       . Calcium Carbonate-Vitamin D (CALCIUM + D PO) Take 600 Units by mouth daily.       . carvedilol (COREG) 12.5 MG tablet Take 1 tablet (12.5 mg total) by mouth 2 (two) times daily with a meal.  60 tablet  3  . Cholecalciferol (VITAMIN D) 1000 UNITS capsule Take 1,000 Units by mouth daily.       . Coenzyme Q10 (CO Q 10 PO) Take 1 tablet by mouth daily.       . colchicine 0.6 MG tablet Take 0.6 mg by mouth daily as needed. GOUT       . HYDROcodone-acetaminophen (NORCO) 5-325 MG per tablet Take 1 tablet by mouth every 8 (eight) hours as needed.      Marland Kitchen losartan (COZAAR) 100 MG tablet Take 100 mg by mouth at bedtime.      . Multiple Vitamin (MULTIVITAMIN) tablet Take 1 tablet by mouth daily.       . promethazine-codeine (PHENERGAN WITH CODEINE)  6.25-10 MG/5ML syrup Take 5 mLs by mouth every 4 (four) hours as needed. COUGH       . rosuvastatin (CRESTOR) 20 MG tablet Take 5 mg by mouth every evening.        No current facility-administered medications on file prior to visit.    Allergies  Allergen Reactions  . Pantoprazole Sodium Palpitations  . Sulfur Swelling and Rash    Past Medical History  Diagnosis Date  . Bradycardia   . Chest pain   . Obesity   . Asthma     none in years  . Sleep apnea     states setting on 11- followed by Dr Westley Hummer yearly- last study 7 years ago  . GERD (gastroesophageal reflux disease)   . Arthritis     osteo  . Gout   . Chronic kidney disease     states elevated creatnine- followed by Dr Joylene Draft  . Breast cancer 2010    Left  . Complication of anesthesia     following tympanoplasty in 1997- "HAD TIGHTENING AROUND CHEST FOLLOWING ANESTHESIA'- states has done well x 2 since  .  Hypertension     LOV, clearance Dr Cathie Olden with note 2/13 EPIC, EKG and Chest  10/12 EPIC  . Diabetes mellitus     borderline/diet controlled    Past Surgical History  Procedure Laterality Date  . Breast lumpectomy  07/2008    Left - Dr Margot Chimes  . Tonsillectomy    . Tympanoplasty  1997    right  . Carpal tunnel release      right  . Shoulder open rotator cuff repair  05/27/2011    Procedure: ROTATOR CUFF REPAIR SHOULDER OPEN;  Surgeon: Tobi Bastos, MD;  Location: [facility];  Service: Orthopedics;  Laterality: Right;  Right Shoulder Rotator Cuff Repair complex      History  Smoking status  . Former Smoker  . Quit date: 04/06/1961  Smokeless tobacco  . Not on file    History  Alcohol Use No    No family history on file.  Reviw of Systems:  Reviewed in the HPI.  All other systems are negative.  Physical Exam: Blood pressure 148/69, pulse 70, weight 261 lb 12.8 oz (118.752 kg). General: Well developed, well nourished, in no acute distress.  Head: Normocephalic, atraumatic, sclera non-icteric,  mucus membranes are moist,   Neck: Supple. Negative for carotid bruits. JVD not elevated.  Lungs: Clear bilaterally to auscultation without wheezes, rales, or rhonchi. Breathing is unlabored.  Heart: RRR with S1 S2. No murmurs, rubs, or gallops appreciated.  Abdomen: Soft, non-tender, non-distended with normoactive bowel sounds. No hepatomegaly. No rebound/guarding. No obvious abdominal masses.  Msk:  Strength and tone appear normal for age.  Extremities: No clubbing or cyanosis. 1+  edema.  Distal pedal pulses are 2+ and equal bilaterally.  Neuro: Alert and oriented X 3. Moves all extremities spontaneously.  Psych:  Responds to questions appropriately with a normal affect.  ECG: Feb. 24, 2014: NSR at 67.  no ST or T wave changes. Assessment / Plan:

## 2012-05-30 NOTE — Assessment & Plan Note (Addendum)
Ms. Branon is doing fairly well. We'll continue with her same medications. She does have a little bit of mild ankle edema occasion. I suspect is due to a combination of her obesity and salt intake. She also is on amlodipine which may be contributing to it.  I've advised her to elevate her legs on occasion. I've asked her to try to lose weight. I will see her again in one year.

## 2012-05-31 ENCOUNTER — Telehealth: Payer: Self-pay | Admitting: Cardiovascular Disease

## 2012-05-31 NOTE — Telephone Encounter (Signed)
Told pt to continue to monitor bp/p, call with questions or concerns, pt agreed to plan.

## 2012-05-31 NOTE — Telephone Encounter (Signed)
New problem    Pt was concern if it's ok to take Hydrocodon b/c of her heart condition.She is taking 1 a day.

## 2012-06-07 ENCOUNTER — Encounter: Payer: Self-pay | Admitting: Cardiovascular Disease

## 2012-06-07 ENCOUNTER — Encounter: Payer: Self-pay | Admitting: Cardiology

## 2012-07-05 ENCOUNTER — Other Ambulatory Visit (HOSPITAL_BASED_OUTPATIENT_CLINIC_OR_DEPARTMENT_OTHER): Payer: Medicare Other | Admitting: Lab

## 2012-07-05 DIAGNOSIS — C50219 Malignant neoplasm of upper-inner quadrant of unspecified female breast: Secondary | ICD-10-CM

## 2012-07-05 DIAGNOSIS — Z853 Personal history of malignant neoplasm of breast: Secondary | ICD-10-CM

## 2012-07-05 LAB — CBC WITH DIFFERENTIAL/PLATELET
BASO%: 0.4 % (ref 0.0–2.0)
EOS%: 1.5 % (ref 0.0–7.0)
MCHC: 32.1 g/dL (ref 31.5–36.0)
MONO#: 0.6 10*3/uL (ref 0.1–0.9)
RBC: 4.43 10*6/uL (ref 3.70–5.45)
RDW: 16.4 % — ABNORMAL HIGH (ref 11.2–14.5)
WBC: 9.1 10*3/uL (ref 3.9–10.3)
lymph#: 1.8 10*3/uL (ref 0.9–3.3)

## 2012-07-05 LAB — COMPREHENSIVE METABOLIC PANEL (CC13)
ALT: 16 U/L (ref 0–55)
AST: 17 U/L (ref 5–34)
CO2: 23 mEq/L (ref 22–29)
Calcium: 9.4 mg/dL (ref 8.4–10.4)
Chloride: 106 mEq/L (ref 98–107)
Sodium: 142 mEq/L (ref 136–145)
Total Protein: 7.2 g/dL (ref 6.4–8.3)

## 2012-07-12 ENCOUNTER — Telehealth: Payer: Self-pay | Admitting: *Deleted

## 2012-07-12 ENCOUNTER — Ambulatory Visit (HOSPITAL_BASED_OUTPATIENT_CLINIC_OR_DEPARTMENT_OTHER): Payer: Medicare Other | Admitting: Oncology

## 2012-07-12 VITALS — BP 153/71 | HR 66 | Temp 98.2°F | Resp 20 | Ht 65.0 in | Wt 257.9 lb

## 2012-07-12 DIAGNOSIS — Z853 Personal history of malignant neoplasm of breast: Secondary | ICD-10-CM

## 2012-07-12 MED ORDER — LETROZOLE 2.5 MG PO TABS: 2.5000 mg | ORAL_TABLET | Freq: Every day | ORAL | Status: DC

## 2012-07-12 NOTE — Progress Notes (Signed)
ID: Vicki [name]   DOB: [birth year]  MR#: [id number]  [id number]  HISTORY OF PRESENT ILLNESS: The patient had routine screening mammography 06/12/2008 showing a possible mass in the left breast.  On 06/19/2008 she had diagnostic mammograms with left ultrasound.  Compression views confirmed a small mass with spiculated margins in the left breast, which Dr. Alyson Ingles was unable to palpate.  Ultrasound measured this at 6 mm.  It was hypoechoic and irregular.  Biopsy was performed the same day, and showed ([id number] and [phone]) an invasive ductal carcinoma which appeared to be low to intermediate grade, 100% ER positive, 99% PR positive, 13% on the MIB-1 with HER-2 positive by [facility] with a ratio of 2.13.  The patient was referred for MRI, but was unable to obtain it because of renal insufficiency, so Dr. Margot Chimes proceeded directly to left lumpectomy and sentinel lymph node biopsy on 07/10/2008.  That report ([id number]) confirmed a 1.2 cm invasive ductal carcinoma, grade 2, with negative margins, no evidence of lymphovascular invasion, and 0 of 1 sentinel lymph node involved.  The patient was then referred to Dr. Sondra Come.  He felt she would be a good candidate for MammoSite radiation, and after insertion of the balloon, she proceeded to receive brachytherapy completed 04/15, for a cumulative dose to the operative cavity of 34 Gy.    Following completion of radiation, the patient began on letrozole in May of 2010 and continues to 2.5 mg daily with good tolerance.  INTERVAL HISTORY: [name] returns today for followup of her breast cancer. 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 That lasted 2 or 3 months and she enjoyed it. She is having significant osteoarthritic pain, and she got headaches from tramadol. She is now using hydrocodone, which she does not like. It does not constipate her however. Otherwise a detailed review of systems is stable.  REVIEW  OF SYSTEMS: She is tolerating the letrozole with mild hot flashes as her only side effect. She sleeps poorly, and sometimes feels moderately fatigued. She had a significant sinus infection recently and received antibiotics for that, with improvement. She does get horse in the afternoons. She can be short of breath when walking up stairs but not with normal activity. She has a history of hematuria which has been worked up. Otherwise a detailed review of systems today was noncontributory    PAST MEDICAL HISTORY: Past Medical History  Diagnosis Date  . Bradycardia   . Chest pain   . Obesity   . Asthma     none in years  . Sleep apnea     states setting on 11- followed by Dr Westley Hummer yearly- last study 7 years ago  . GERD (gastroesophageal reflux disease)   . Arthritis     osteo  . Gout   . Chronic kidney disease     states elevated creatnine- followed by Dr Joylene Draft  . Breast cancer 2010    Left  . Complication of anesthesia     following tympanoplasty in 1997- "HAD TIGHTENING AROUND CHEST FOLLOWING ANESTHESIA'- states has done well x 2 since  . Hypertension     LOV, clearance Dr Cathie Olden with note 2/13 EPIC, EKG and Chest  10/12 EPIC  . Diabetes mellitus     borderline/diet controlled    PAST SURGICAL HISTORY: Past Surgical History  Procedure Laterality Date  . Breast lumpectomy  07/2008    Left - Dr Margot Chimes  . Tonsillectomy    .  Tympanoplasty  1997    right  . Carpal tunnel release      right  . Shoulder open rotator cuff repair  05/27/2011    Procedure: ROTATOR CUFF REPAIR SHOULDER OPEN;  Surgeon: Tobi Bastos, MD;  Location: [facility];  Service: Orthopedics;  Laterality: Right;  Right Shoulder Rotator Cuff Repair complex      FAMILY HISTORY No family history on file. The patient's maternal grandmother died from postmenopausal breast cancer.  One of the patient's mothers four sisters also had breast cancer, also postmenopausal.  The patient's father, who had an alcohol  problem and was a smoker, died at the age of 60.  The patient's mother died in the setting of dementia with renal failure and diabetes at the age of 44.  The patient had 6 sisters and 3 brothers.  One sister had breast cancer diagnosed in her late 4s.  Another sister may have had cancer of the uterus.  It is not clear to the patient.  There is no other cancer in the family that she is aware, although the patient's mother did have a "malignant colon polyp" removed.  GYNECOLOGIC HISTORY:  The patient is GX, P6.  First pregnancy to term, age 74.  She received hormones for 2-3 years after menopause in her late 0000000 with no complications.  SOCIAL HISTORY:    ADVANCED DIRECTIVES: Not in place  HEALTH MAINTENANCE: History  Substance Use Topics  . Smoking status: Former Smoker    Quit date: 04/06/1961  . Smokeless tobacco: Not on file  . Alcohol Use: No     Colonoscopy:  PAP:  Bone density:2010  Lipid panel:  Allergies  Allergen Reactions  . Pantoprazole Sodium Palpitations  . Sulfur Swelling and Rash    Current Outpatient Prescriptions  Medication Sig Dispense Refill  . allopurinol (ZYLOPRIM) 300 MG tablet Take 150 mg by mouth daily.      Marland Kitchen amLODipine (NORVASC) 2.5 MG tablet Take 2.5 mg by mouth daily. States takes 1100 AM      . aspirin 81 MG tablet Take 81 mg by mouth every other day.       . Calcium Carbonate-Vitamin D (CALCIUM + D PO) Take 600 Units by mouth daily.       . carvedilol (COREG) 12.5 MG tablet Take 1 tablet (12.5 mg total) by mouth 2 (two) times daily with a meal.  60 tablet  3  . Cholecalciferol (VITAMIN D) 1000 UNITS capsule Take 1,000 Units by mouth daily.       . Coenzyme Q10 (CO Q 10 PO) Take 1 tablet by mouth daily.       . colchicine 0.6 MG tablet Take 0.6 mg by mouth daily as needed. GOUT       . EPIPEN 2-PAK 0.3 MG/0.3ML DEVI       . furosemide (LASIX) 20 MG tablet Take 20 mg by mouth as needed.      Marland Kitchen HYDROcodone-acetaminophen (NORCO) 5-325 MG per tablet  Take 1 tablet by mouth every 8 (eight) hours as needed.      Marland Kitchen losartan (COZAAR) 100 MG tablet Take 100 mg by mouth at bedtime.      . Multiple Vitamin (MULTIVITAMIN) tablet Take 1 tablet by mouth daily.       . promethazine-codeine (PHENERGAN WITH CODEINE) 6.25-10 MG/5ML syrup Take 5 mLs by mouth every 4 (four) hours as needed. COUGH       . rosuvastatin (CRESTOR) 20 MG tablet Take 5 mg by mouth  every evening.        No current facility-administered medications for this visit.    OBJECTIVE: Middle-aged [place] American woman who appears well Filed Vitals:   07/12/12 0911  BP: 153/71  Pulse: 66  Temp: 98.2 F (36.8 C)  Resp: 20     Body mass index is 42.92 kg/(m^2).    ECOG FS: 1  Sclerae unicteric Oropharynx clear No cervical or supraclavicular adenopathy Lungs no rales or rhonchi Heart regular rate and rhythm Abd obese, benign MSK no focal spinal tenderness, no peripheral edema Neuro: nonfocal, well oriented, pleasant affect Breasts: The right breast is unremarkable. The left breast is status post lumpectomy and MammoSite radiation. There is no evidence of local recurrence. The left axilla is benign.  LAB RESULTS: Lab Results  Component Value Date   WBC 9.1 07/05/2012   NEUTROABS 6.5 07/05/2012   HGB 11.7 07/05/2012   HCT 36.4 07/05/2012   MCV 82.2 07/05/2012   PLT 221 07/05/2012      Chemistry      Component Value Date/Time   NA 142 07/05/2012 1101   NA 140 07/14/2011 0928   K 4.1 07/05/2012 1101   K 3.8 07/14/2011 0928   CL 106 07/05/2012 1101   CL 104 07/14/2011 0928   CO2 23 07/05/2012 1101   CO2 24 07/14/2011 0928   BUN 17.4 07/05/2012 1101   BUN 27* 07/14/2011 0928   CREATININE 1.5* 07/05/2012 1101   CREATININE 1.40* 07/14/2011 0928      Component Value Date/Time   CALCIUM 9.4 07/05/2012 1101   CALCIUM 9.0 07/14/2011 0928   ALKPHOS 93 07/05/2012 1101   ALKPHOS 87 07/14/2011 0928   AST 17 07/05/2012 1101   AST 15 07/14/2011 0928   ALT 16 07/05/2012 1101   ALT 14 07/14/2011 0928   BILITOT 0.32  07/05/2012 1101   BILITOT 0.3 07/14/2011 0928       Lab Results  Component Value Date   LABCA2 30 12/26/2010    STUDIES: No results found.  ASSESSMENT: 78 y.o.  [place] woman   (1)  status post left lumpectomy and sentinel lymph node sampling in April 2010 for a T1c N0, grade 2 invasive ductal carcinoma, estrogen and progesterone receptor-positive, HER-2 also amplified,   (2)  status post MammoSite radiation, completed in April 2010   (3)  on letrozole since May 2010 with good tolerance. Bone density 11/24/2011 normal  PLAN: She is doing terrific from a breast cancer point of view, with no evidence of recurrence now 4 years out from her definitive surgery. The plan is to continue letrozole an additional year, and then release her to Dr. Silvestre Mesi care. She knows to call for any problems that may develop before the next visit.   Semya Klinke C    07/12/2012

## 2012-07-12 NOTE — Telephone Encounter (Signed)
appts made and printed 

## 2012-07-18 ENCOUNTER — Other Ambulatory Visit: Payer: Self-pay | Admitting: *Deleted

## 2012-07-18 MED ORDER — CARVEDILOL 12.5 MG PO TABS: 12.5000 mg | ORAL_TABLET | Freq: Two times a day (BID) | ORAL | Status: DC

## 2012-07-18 NOTE — Telephone Encounter (Signed)
Fax Received. Refill Completed. Vicki [name] (R.M.A)   

## 2012-08-12 ENCOUNTER — Ambulatory Visit (INDEPENDENT_AMBULATORY_CARE_PROVIDER_SITE_OTHER): Payer: Medicare Other | Admitting: *Deleted

## 2012-08-12 DIAGNOSIS — M79609 Pain in unspecified limb: Secondary | ICD-10-CM

## 2012-08-16 ENCOUNTER — Other Ambulatory Visit: Payer: Self-pay | Admitting: *Deleted

## 2012-08-16 DIAGNOSIS — M7989 Other specified soft tissue disorders: Secondary | ICD-10-CM

## 2012-08-16 DIAGNOSIS — M79609 Pain in unspecified limb: Secondary | ICD-10-CM

## 2012-10-04 ENCOUNTER — Encounter: Payer: Self-pay | Admitting: Gynecology

## 2012-10-04 ENCOUNTER — Ambulatory Visit (INDEPENDENT_AMBULATORY_CARE_PROVIDER_SITE_OTHER): Payer: Medicare Other | Admitting: Gynecology

## 2012-10-04 VITALS — BP 130/80 | Ht 65.0 in | Wt 238.0 lb

## 2012-10-04 DIAGNOSIS — N952 Postmenopausal atrophic vaginitis: Secondary | ICD-10-CM

## 2012-10-04 DIAGNOSIS — C50919 Malignant neoplasm of unspecified site of unspecified female breast: Secondary | ICD-10-CM

## 2012-10-04 DIAGNOSIS — C50912 Malignant neoplasm of unspecified site of left female breast: Secondary | ICD-10-CM

## 2012-10-04 NOTE — Patient Instructions (Signed)
Follow up in one year, sooner as needed. 

## 2012-10-04 NOTE — Progress Notes (Signed)
Vicki [name] [birth year] [id number]        78 y.o.  [id number] for followup exam.  Former patient of Dr. Ubaldo Glassing.  Past medical history,surgical history, medications, allergies, family history and social history were all reviewed and documented in the EPIC chart.  ROS:  Performed and pertinent positives and negatives are included in the history, assessment and plan .  Exam: Kim assistant Filed Vitals:   10/04/12 1055  BP: 130/80  Height: 5\' 5"  (1.651 m)  Weight: 238 lb (107.956 kg)   General appearance  Normal Skin grossly normal Head/Neck normal with no cervical or supraclavicular adenopathy thyroid normal Lungs  clear Cardiac RR, without RMG Abdominal  soft, nontender, without masses, organomegaly or hernia Breasts  examined lying and sitting without masses, retractions, discharge or axillary adenopathy. Well-healed left lumpectomy scar. Pelvic  Ext/BUS/vagina  normal with atrophic changes  Cervix  normal with atrophic changes  Uterus  grossly normal size, exam difficult due to abdominal girth   Adnexa  Without gross masses or tenderness    Anus and perineum  normal   Rectovaginal  normal sphincter tone without palpated masses or tenderness.    Assessment/Plan:  78 y.o. [id number] female for followup exam.   1. History of left breast cancer status post lumpectomy. On Femara doing well. Continue followup with her oncologist. Mammography 11/2011. Continue with annual mammography. SBE monthly reviewed. 2. Atrophic vaginal changes. Patient not currently sexual active. Not having significant symptoms. We'll continue to monitor. 3. Pap smear 2013. No Pap smear done today. No history of abnormal Pap smears previously. Review current screening guidelines and plan to stop screening and she is comfortable with this. 4. DEXA 2013 normal. Continue to followup with her primary physician in reference to this. Increase calcium vitamin D reviewed. 5. Colonoscopy 2 years ago. Repeated their  recommended interval. 6. Health maintenance. No lab work done as it is all done through her primary physician's office who she actively sees. Followup one year, sooner as needed.    [name], 11:47 AM 10/04/2012

## 2012-10-10 ENCOUNTER — Encounter: Payer: Self-pay | Admitting: Neurology

## 2012-10-13 ENCOUNTER — Ambulatory Visit: Payer: Self-pay | Admitting: Neurology

## 2012-10-24 ENCOUNTER — Other Ambulatory Visit: Payer: Self-pay | Admitting: Oncology

## 2012-10-24 DIAGNOSIS — Z853 Personal history of malignant neoplasm of breast: Secondary | ICD-10-CM

## 2012-12-01 ENCOUNTER — Ambulatory Visit
Admission: RE | Admit: 2012-12-01 | Discharge: 2012-12-01 | Disposition: A | Discharge status: Home / Self Care | Payer: Medicare Other | Source: Ambulatory Visit | Attending: Oncology | Admitting: Oncology

## 2012-12-01 DIAGNOSIS — Z853 Personal history of malignant neoplasm of breast: Secondary | ICD-10-CM

## 2013-01-17 ENCOUNTER — Other Ambulatory Visit: Payer: Self-pay | Admitting: Cardiovascular Disease

## 2013-01-24 ENCOUNTER — Encounter: Payer: Self-pay | Admitting: Neurology

## 2013-01-24 ENCOUNTER — Ambulatory Visit (INDEPENDENT_AMBULATORY_CARE_PROVIDER_SITE_OTHER): Payer: Medicare Other | Admitting: Neurology

## 2013-01-24 VITALS — BP 151/60 | HR 56 | Resp 16 | Ht 66.0 in | Wt 262.0 lb

## 2013-01-24 DIAGNOSIS — G4733 Obstructive sleep apnea (adult) (pediatric): Secondary | ICD-10-CM

## 2013-01-24 DIAGNOSIS — G4701 Insomnia due to medical condition: Secondary | ICD-10-CM

## 2013-01-24 DIAGNOSIS — G4730 Sleep apnea, unspecified: Secondary | ICD-10-CM

## 2013-01-24 HISTORY — DX: Sleep apnea, unspecified: G47.30

## 2013-01-24 MED ORDER — ZOLPIDEM TARTRATE 5 MG PO TABS: 5.0000 mg | ORAL_TABLET | Freq: Every evening | ORAL | Status: DC | PRN

## 2013-01-24 NOTE — Progress Notes (Signed)
[facility]  Provider:  Larey Seat, M D  Referring Provider: Jerlyn Ly, MD Primary Care Physician:  Jerlyn Ly, MD  Chief Complaint  Patient presents with  . F/U Visit    HPI:  Vicki [name] is a 78 y.o. female  Is seen here as a  revisit  from Dr. Joylene Draft for follow up on Sleep Apnea .  2 diagnosed with obstructive sleep apnea diagnosis took place in 2005. The patient has been using CPAP at 12 cm water setting, she is using it nightly he has a 30 day download available here with 100% compliance. Residual AHI of 0.5. The 12 cm water setting is unchanged in previous years. Nightly time and therapy is 8 hours and 24 minutes. Epworth sleepiness score is endorsed at 5 points, or fatigue severity score however which is 60 points and is highly elevated.  Review of her medicine shows that she is still taking Vicodin for shoulder pain as needed. She is still on Allopurinol for the treatment of gout and of Norvasc for blood pressure control she's also on Coreg, Femara , and Crestor.  Dr. Joylene Draft reduced to Crestor from 20-10 mg after she developed myalgia.    Review of Systems: Out of a complete 14 system review, the patient complains of only the following symptoms, and all other reviewed systems are negative. The patient had recently surgery does a rotator cuff right and left him a interradial systems she and her was joint pain ongoing sometimes feeling hot alternating with feeling cold and having leg swelling she also has insomnia, and her skin has developed some moles. She is retired , and she is followed by Dr. Jana Hakim for her breast cancer.    History   Social History  . Marital Status: Married    Spouse Name: N/A    Number of Children: N/A  . Years of Education: N/A   Occupational History  . Not on file.   Social History Main Topics  . Smoking status: Former Smoker    Quit date: 04/06/1961  . Smokeless tobacco: Not on file   . Alcohol Use: No  . Drug Use: No  . Sexual Activity: No   Other Topics Concern  . Not on file   Social History Narrative  . No narrative on file    Family History  Problem Relation Age of Onset  . Diabetes Mother   . Breast cancer Sister     Age unknown  . Diabetes Sister   . Diabetes Brother   . Breast cancer Maternal Grandmother     Age unknown    Past Medical History  Diagnosis Date  . Bradycardia   . Chest pain   . Obesity   . Asthma     none in years  . Sleep apnea     states setting on 11- followed by Dr Westley Hummer yearly- last study 7 years ago  . GERD (gastroesophageal reflux disease)   . Arthritis     osteo  . Gout   . Chronic kidney disease     states elevated creatnine- followed by Dr Joylene Draft  . Breast cancer 2010    Left  . Complication of anesthesia     following tympanoplasty in 1997- "HAD TIGHTENING AROUND CHEST FOLLOWING ANESTHESIA'- states has done well x 2 since  . Hypertension     LOV, clearance Dr Cathie Olden with note 2/13 EPIC, EKG and Chest  10/12 EPIC  . Diabetes mellitus  borderline/diet controlled    Past Surgical History  Procedure Laterality Date  . Breast lumpectomy  07/2008    Left - Dr Margot Chimes  . Tonsillectomy    . Tympanoplasty  1997    right  . Carpal tunnel release      right  . Shoulder open rotator cuff repair  05/27/2011    Procedure: ROTATOR CUFF REPAIR SHOULDER OPEN;  Surgeon: Tobi Bastos, MD;  Location: [facility];  Service: Orthopedics;  Laterality: Right;  Right Shoulder Rotator Cuff Repair complex    . Dilation and curettage of uterus      Current Outpatient Prescriptions  Medication Sig Dispense Refill  . allopurinol (ZYLOPRIM) 300 MG tablet Take 150 mg by mouth daily.      Marland Kitchen amLODipine (NORVASC) 2.5 MG tablet Take 2.5 mg by mouth daily. States takes 1100 AM      . aspirin 81 MG tablet Take 81 mg by mouth every other day.       . Calcium Carbonate-Vitamin D (CALCIUM + D PO) Take 600 Units by mouth daily.        . carvedilol (COREG) 12.5 MG tablet TAKE 1 TABLET TWICE DAILY WITH A MEAL.  60 tablet  3  . Cholecalciferol (VITAMIN D) 1000 UNITS capsule Take 1,000 Units by mouth daily.       . Coenzyme Q10 (CO Q 10 PO) Take 1 tablet by mouth daily.       Marland Kitchen letrozole (FEMARA) 2.5 MG tablet Take 1 tablet (2.5 mg total) by mouth daily.  90 tablet  12  . losartan (COZAAR) 100 MG tablet Take 100 mg by mouth at bedtime.      . Multiple Vitamin (MULTIVITAMIN) tablet Take 1 tablet by mouth daily.       . rosuvastatin (CRESTOR) 20 MG tablet Take 5 mg by mouth every evening.       Marland Kitchen EPIPEN 2-PAK 0.3 MG/0.3ML DEVI       . HYDROcodone-acetaminophen (NORCO) 5-325 MG per tablet Take 1 tablet by mouth every 8 (eight) hours as needed.      . piroxicam (FELDENE) 20 MG capsule       . promethazine-codeine (PHENERGAN WITH CODEINE) 6.25-10 MG/5ML syrup Take 5 mLs by mouth every 4 (four) hours as needed. COUGH       . traZODone (DESYREL) 50 MG tablet        No current facility-administered medications for this visit.    Allergies as of 01/24/2013 - Review Complete 01/24/2013  Allergen Reaction Noted  . Shellfish allergy Swelling 10/04/2012  . Pantoprazole sodium Palpitations 07/23/2010  . Sulfur Swelling and Rash 07/23/2010    Vitals: BP 151/60  Pulse 56  Resp 16  Ht 5\' 6"  (1.676 m)  Wt 262 lb (118.842 kg)  BMI 42.31 kg/m2 Last Weight:  Wt Readings from Last 1 Encounters:  01/24/13 262 lb (118.842 kg)   Last Height:   Ht Readings from Last 1 Encounters:  01/24/13 5\' 6"  (1.676 m)    Physical exam:  General: The patient is awake, alert and appears not in acute distress. The patient is well groomed. Head: Normocephalic, atraumatic. Neck is supple. Mallampati 3  neck circumference: 15.5. Cardiovascular:  Regular rate and rhythm , without  murmurs or carotid bruit, and without distended neck veins. Respiratory: Lungs are clear to auscultation. Skin:  Without evidence of edema, or rash Trunk: BMI is   elevated , she  has normal posture.  Neurologic exam : The patient is awake  and alert, oriented to place and time.  Memory subjective described as intact. There is a normal attention span & concentration ability. Speech is fluent without dysarthria, dysphonia or aphasia.  Mood and affect are appropriate.  Cranial nerves: Pupils are equal and briskly reactive to light. Funduscopic exam without  evidence of pallor or edema. Extraocular movements  in vertical and horizontal planes intact and without nystagmus. Visual fields by finger perimetry are intact. Hearing to finger rub intact.  Facial sensation intact to fine touch. Facial motor strength is symmetric and tongue and uvula move midline.  Motor exam:   Normal tone and normal muscle bulk and symmetric normal strength in all extremities.  Sensory:  Fine touch, pinprick and vibration were tested in all extremities. Proprioception is  normal.  Coordination: Rapid alternating movements in the fingers/hands is tested and normal. Finger-to-nose maneuver tested and normal without evidence of ataxia, dysmetria or tremor.  Gait and station: Patient walks without assistive device. Strength within normal limits. Stance is stable and normal. Tandem gait is fragmented.  She has pain in both legs , fatigued quickly . Romberg testing is normal. Deep tendon reflexes: in the  upper and lower extremities are  Attenuated, symmetric . Babinski maneuver response is  downgoing.   Assessment:  After physical and neurologic examination, review of laboratory studies, imaging, neurophysiology testing and pre-existing records, assessment is   that of OSA, 1) CPAP - OSA well controlled on current settings, and with reduced EDS. 2)High BMI , still gaining weight.  Blood sugar control is poor.  3)Menopausal hot flushes from being on Femara, the therapy will be concluded  next year.   Plan:  Treatment plan and additional workup :

## 2013-01-24 NOTE — Patient Instructions (Signed)
Exercise to Lose Weight Exercise and a healthy diet may help you lose weight. Your doctor may suggest specific exercises. EXERCISE IDEAS AND TIPS  Choose low-cost things you enjoy doing, such as walking, bicycling, or exercising to workout videos.  Take stairs instead of the elevator.  Walk during your lunch break.  Park your car further away from work or school.  Go to a gym or an exercise class.  Start with 5 to 10 minutes of exercise each day. Build up to 30 minutes of exercise 4 to 6 days a week.  Wear shoes with good support and comfortable clothes.  Stretch before and after working out.  Work out until you breathe harder and your heart beats faster.  Drink extra water when you exercise.  Do not do so much that you hurt yourself, feel dizzy, or get very short of breath. Exercises that burn about 150 calories:  Running 1  miles in 15 minutes.  Playing volleyball for 45 to 60 minutes.  Washing and waxing a car for 45 to 60 minutes.  Playing touch football for 45 minutes.  Walking 1  miles in 35 minutes.  Pushing a stroller 1  miles in 30 minutes.  Playing basketball for 30 minutes.  Raking leaves for 30 minutes.  Bicycling 5 miles in 30 minutes.  Walking 2 miles in 30 minutes.  Dancing for 30 minutes.  Shoveling snow for 15 minutes.  Swimming laps for 20 minutes.  Walking up stairs for 15 minutes.  Bicycling 4 miles in 15 minutes.  Gardening for 30 to 45 minutes.  Jumping rope for 15 minutes.  Washing windows or floors for 45 to 60 minutes. Document Released: 04/25/2010 Document Revised: 06/15/2011 Document Reviewed: 04/25/2010 [facility] Patient Information 2014 [place], [place]. Sleep Apnea  Sleep apnea is a sleep disorder characterized by abnormal pauses in breathing while you sleep. When your breathing pauses, the level of oxygen in your blood decreases. This causes you to move out of deep sleep and into light sleep. As a result, your quality of  sleep is poor, and the system that carries your blood throughout your body (cardiovascular system) experiences stress. If sleep apnea remains untreated, the following conditions can develop:  High blood pressure (hypertension).  Coronary artery disease.  Inability to achieve or maintain an erection (impotence).  Impairment of your thought process (cognitive dysfunction). There are three types of sleep apnea: 1. Obstructive sleep apnea Pauses in breathing during sleep because of a blocked airway. 2. Central sleep apnea Pauses in breathing during sleep because the area of the brain that controls your breathing does not send the correct signals to the muscles that control breathing. 3. Mixed sleep apnea A combination of both obstructive and central sleep apnea. RISK FACTORS The following risk factors can increase your risk of developing sleep apnea:  Being overweight.  Smoking.  Having narrow passages in your nose and throat.  Being of older age.  Being female.  Alcohol use.  Sedative and tranquilizer use.  Ethnicity. Among individuals younger than 35 years, African Americans are at increased risk of sleep apnea. SYMPTOMS   Difficulty staying asleep.  Daytime sleepiness and fatigue.  Loss of energy.  Irritability.  Loud, heavy snoring.  Morning headaches.  Trouble concentrating.  Forgetfulness.  Decreased interest in sex. DIAGNOSIS  In order to diagnose sleep apnea, your caregiver will perform a physical examination. Your caregiver may suggest that you take a home sleep test. Your caregiver may also recommend that you spend the  night in a sleep lab. In the sleep lab, several monitors record information about your heart, lungs, and brain while you sleep. Your leg and arm movements and blood oxygen level are also recorded. TREATMENT The following actions may help to resolve mild sleep apnea:  Sleeping on your side.   Using a decongestant if you have nasal  congestion.   Avoiding the use of depressants, including alcohol, sedatives, and narcotics.   Losing weight and modifying your diet if you are overweight. There also are devices and treatments to help open your airway:  Oral appliances. These are custom-made mouthpieces that shift your lower jaw forward and slightly open your bite. This opens your airway.  Devices that create positive airway pressure. This positive pressure "splints" your airway open to help you breathe better during sleep. The following devices create positive airway pressure:  Continuous positive airway pressure (CPAP) device. The CPAP device creates a continuous level of air pressure with an air pump. The air is delivered to your airway through a mask while you sleep. This continuous pressure keeps your airway open.  Nasal expiratory positive airway pressure (EPAP) device. The EPAP device creates positive air pressure as you exhale. The device consists of single-use valves, which are inserted into each nostril and held in place by adhesive. The valves create very little resistance when you inhale but create much more resistance when you exhale. That increased resistance creates the positive airway pressure. This positive pressure while you exhale keeps your airway open, making it easier to breath when you inhale again.  Bilevel positive airway pressure (BPAP) device. The BPAP device is used mainly in patients with central sleep apnea. This device is similar to the CPAP device because it also uses an air pump to deliver continuous air pressure through a mask. However, with the BPAP machine, the pressure is set at two different levels. The pressure when you exhale is lower than the pressure when you inhale.  Surgery. Typically, surgery is only done if you cannot comply with less invasive treatments or if the less invasive treatments do not improve your condition. Surgery involves removing excess tissue in your airway to create a  wider passage way. Document Released: 03/13/2002 Document Revised: 09/22/2011 Document Reviewed: 07/30/2011 [facility] Patient Information 2014 [place].

## 2013-01-24 NOTE — Addendum Note (Signed)
Addended by: Larey Seat on: 01/24/2013 04:09 PM   Modules accepted: Orders

## 2013-01-26 ENCOUNTER — Encounter: Payer: Self-pay | Admitting: Neurology

## 2013-01-31 ENCOUNTER — Encounter: Payer: Self-pay | Admitting: Neurology

## 2013-05-18 ENCOUNTER — Encounter: Payer: Self-pay | Admitting: Cardiovascular Disease

## 2013-05-23 ENCOUNTER — Other Ambulatory Visit: Payer: Self-pay | Admitting: Cardiovascular Disease

## 2013-06-02 ENCOUNTER — Ambulatory Visit: Payer: Medicare Other | Admitting: Cardiovascular Disease

## 2013-06-21 ENCOUNTER — Other Ambulatory Visit: Payer: Self-pay | Admitting: Physician Assistant

## 2013-06-30 ENCOUNTER — Ambulatory Visit: Payer: Medicare Other | Admitting: Cardiovascular Disease

## 2013-07-02 ENCOUNTER — Other Ambulatory Visit: Payer: Self-pay | Admitting: Cardiovascular Disease

## 2013-07-06 ENCOUNTER — Other Ambulatory Visit (HOSPITAL_BASED_OUTPATIENT_CLINIC_OR_DEPARTMENT_OTHER): Payer: Medicare Other

## 2013-07-06 DIAGNOSIS — Z853 Personal history of malignant neoplasm of breast: Secondary | ICD-10-CM

## 2013-07-06 LAB — COMPREHENSIVE METABOLIC PANEL (CC13)
ALK PHOS: 102 U/L (ref 40–150)
ALT: 13 U/L (ref 0–55)
AST: 14 U/L (ref 5–34)
Albumin: 3.6 g/dL (ref 3.5–5.0)
Anion Gap: 13 mEq/L — ABNORMAL HIGH (ref 3–11)
BILIRUBIN TOTAL: 0.37 mg/dL (ref 0.20–1.20)
BUN: 21.2 mg/dL (ref 7.0–26.0)
CO2: 20 mEq/L — ABNORMAL LOW (ref 22–29)
CREATININE: 1.4 mg/dL — AB (ref 0.6–1.1)
Calcium: 9.4 mg/dL (ref 8.4–10.4)
Chloride: 107 mEq/L (ref 98–109)
Glucose: 170 mg/dl — ABNORMAL HIGH (ref 70–140)
Potassium: 3.9 mEq/L (ref 3.5–5.1)
Sodium: 140 mEq/L (ref 136–145)
Total Protein: 7.3 g/dL (ref 6.4–8.3)

## 2013-07-06 LAB — CBC WITH DIFFERENTIAL/PLATELET
BASO%: 0.2 % (ref 0.0–2.0)
BASOS ABS: 0 10*3/uL (ref 0.0–0.1)
EOS%: 0.9 % (ref 0.0–7.0)
Eosinophils Absolute: 0.1 10*3/uL (ref 0.0–0.5)
HEMATOCRIT: 36.8 % (ref 34.8–46.6)
HGB: 11.9 g/dL (ref 11.6–15.9)
LYMPH%: 19.2 % (ref 14.0–49.7)
MCH: 26.9 pg (ref 25.1–34.0)
MCHC: 32.3 g/dL (ref 31.5–36.0)
MCV: 83.1 fL (ref 79.5–101.0)
MONO#: 0.9 10*3/uL (ref 0.1–0.9)
MONO%: 8.3 % (ref 0.0–14.0)
NEUT#: 7.7 10*3/uL — ABNORMAL HIGH (ref 1.5–6.5)
NEUT%: 71.4 % (ref 38.4–76.8)
Platelets: 241 10*3/uL (ref 145–400)
RBC: 4.43 10*6/uL (ref 3.70–5.45)
RDW: 15.8 % — ABNORMAL HIGH (ref 11.2–14.5)
WBC: 10.8 10*3/uL — ABNORMAL HIGH (ref 3.9–10.3)
lymph#: 2.1 10*3/uL (ref 0.9–3.3)

## 2013-07-13 ENCOUNTER — Ambulatory Visit (HOSPITAL_BASED_OUTPATIENT_CLINIC_OR_DEPARTMENT_OTHER): Payer: Medicare Other | Admitting: Physician Assistant

## 2013-07-13 ENCOUNTER — Ambulatory Visit: Payer: Medicare Other | Admitting: Oncology

## 2013-07-13 ENCOUNTER — Telehealth: Payer: Self-pay | Admitting: *Deleted

## 2013-07-13 ENCOUNTER — Encounter: Payer: Self-pay | Admitting: Physician Assistant

## 2013-07-13 ENCOUNTER — Ambulatory Visit (HOSPITAL_BASED_OUTPATIENT_CLINIC_OR_DEPARTMENT_OTHER): Payer: Medicare Other

## 2013-07-13 VITALS — BP 164/84 | HR 67 | Temp 98.5°F | Resp 18 | Ht 66.0 in | Wt 257.2 lb

## 2013-07-13 DIAGNOSIS — M2550 Pain in unspecified joint: Secondary | ICD-10-CM

## 2013-07-13 DIAGNOSIS — Z853 Personal history of malignant neoplasm of breast: Secondary | ICD-10-CM

## 2013-07-13 DIAGNOSIS — D72829 Elevated white blood cell count, unspecified: Secondary | ICD-10-CM

## 2013-07-13 DIAGNOSIS — R0609 Other forms of dyspnea: Secondary | ICD-10-CM

## 2013-07-13 DIAGNOSIS — R0989 Other specified symptoms and signs involving the circulatory and respiratory systems: Secondary | ICD-10-CM

## 2013-07-13 DIAGNOSIS — C50219 Malignant neoplasm of upper-inner quadrant of unspecified female breast: Secondary | ICD-10-CM

## 2013-07-13 DIAGNOSIS — Z170 Estrogen receptor positive status [ER+]: Secondary | ICD-10-CM

## 2013-07-13 LAB — CBC WITH DIFFERENTIAL/PLATELET
BASO%: 0.4 % (ref 0.0–2.0)
Basophils Absolute: 0 10*3/uL (ref 0.0–0.1)
EOS ABS: 0.1 10*3/uL (ref 0.0–0.5)
EOS%: 1.4 % (ref 0.0–7.0)
HEMATOCRIT: 36.1 % (ref 34.8–46.6)
HEMOGLOBIN: 11.8 g/dL (ref 11.6–15.9)
LYMPH%: 24 % (ref 14.0–49.7)
MCH: 26.8 pg (ref 25.1–34.0)
MCHC: 32.7 g/dL (ref 31.5–36.0)
MCV: 81.9 fL (ref 79.5–101.0)
MONO#: 0.8 10*3/uL (ref 0.1–0.9)
MONO%: 9.3 % (ref 0.0–14.0)
NEUT%: 64.9 % (ref 38.4–76.8)
NEUTROS ABS: 5.2 10*3/uL (ref 1.5–6.5)
Platelets: 254 10*3/uL (ref 145–400)
RBC: 4.41 10*6/uL (ref 3.70–5.45)
RDW: 15.6 % — AB (ref 11.2–14.5)
WBC: 8.1 10*3/uL (ref 3.9–10.3)
lymph#: 1.9 10*3/uL (ref 0.9–3.3)
nRBC: 0 % (ref 0–0)

## 2013-07-13 NOTE — Progress Notes (Signed)
ID: Vicki [name]   DOB: 01-11-1935  MR#: [id number]  CSN#:[id number]   PCP:  Vicki Ly, MD GYN: SU:   OTHER:  Vicki Pray, MD;  Vicki Pound, MD;  Vicki Seat, MD  Vicki Furlong, MD;  Vicki Moores, MD  CHIEF COMPLAINT:  Hx of Left Breast Cancer   HISTORY OF PRESENT ILLNESS: The patient had routine screening mammography 06/12/2008 showing a possible mass in the left breast.  On 06/19/2008 she had diagnostic mammograms with left ultrasound.  Compression views confirmed a small mass with spiculated margins in the left breast, which Dr. Alyson [name] was unable to palpate.  Ultrasound measured this at 6 mm.  It was hypoechoic and irregular.  Biopsy was performed the same day, and showed ([id number] and [phone]) an invasive ductal carcinoma which appeared to be low to intermediate grade, 100% ER positive, 99% PR positive, 13% on the MIB-1 with HER-2 positive by CISH with a ratio of 2.13.  The patient was referred for MRI, but was unable to obtain it because of renal insufficiency, so Dr. Margot [name] proceeded directly to left lumpectomy and sentinel lymph node biopsy on 07/10/2008.  That report ([id number]) confirmed a 1.2 cm invasive ductal carcinoma, grade 2, with negative margins, no evidence of lymphovascular invasion, and 0 of 1 sentinel lymph node involved.  The patient was then referred to Dr. Sondra [name].  He felt she would be a good candidate for MammoSite radiation, and after insertion of the balloon, she proceeded to receive brachytherapy completed 04/15, for a cumulative dose to the operative cavity of 34 Gy.    Following completion of radiation, the patient began on letrozole in May of 2010 and continues to 2.5 mg daily with good tolerance.  Subsequent history is as detailed below  INTERVAL HISTORY: [name] returns alone today for followup of her left breast cancer. She continues on letrozole, and is now completing her fifth year of therapy. She's tolerated the medication well with the  exception of hot flashes which she "tolerates". She does have some joint pain which is worsened recently, that she is actually been worked up for arthritis, and possibly rheumatoid arthritis or even Lupus. She is being followed by her primary care physician, Dr.  Joylene [name], and also by her rheumatologist, Dr.  Trudie [name].  REVIEW OF SYSTEMS: Otherwise, Vicki [name] denies any recent fevers and has had no known illnesses. Her energy level is fair. She does have some difficulty sleeping at times. She's eating and drinking well with no nausea, emesis, or change in bowel or bladder habits. She does have a history of an elevated serum creatinine which is being followed closely and appears to be stable. She denies any cough or phlegm production but does complain of shortness of breath with exertion. The shortness of breath is stable. She admits that she is deconditioned and is not exercising much due to the pain in her knees. She's had no chest pain or palpitations. Her feet swell at times, and this is stable. She's had no abnormal headaches, dizziness, or change in vision. She's not feel anxious or depressed, and she does not feel forgetful. As noted above she is having some chronic pain, primarily affecting her joints and also affecting her back.  A detailed review of systems is otherwise stable and noncontributory.   PAST MEDICAL HISTORY: Past Medical History  Diagnosis Date  . Bradycardia   . Chest pain   . Obesity   . Asthma     none in years  . Sleep apnea  states setting on 11- followed by Dr Vicki [name] yearly- last study 7 years ago  . GERD (gastroesophageal reflux disease)   . Arthritis     osteo  . Gout   . Chronic kidney disease     states elevated creatnine- followed by Dr Vicki [name]  . Breast cancer 2010    Left  . Complication of anesthesia     following tympanoplasty in 1997- "HAD TIGHTENING AROUND CHEST FOLLOWING ANESTHESIA'- states has done well x 2 since  . Hypertension     LOV, clearance Dr  Vicki [name] with note 2/13 EPIC, EKG and Chest  10/12 EPIC  . Diabetes mellitus     borderline/diet controlled  . Sleep apnea with use of continuous positive airway pressure (CPAP) 01/24/2013    PAST SURGICAL HISTORY: Past Surgical History  Procedure Laterality Date  . Breast lumpectomy  07/2008    Left - Dr Vicki [name]  . Tonsillectomy    . Tympanoplasty  1997    right  . Carpal tunnel release      right  . Shoulder open rotator cuff repair  05/27/2011    Procedure: ROTATOR CUFF REPAIR SHOULDER OPEN;  Surgeon: Vicki Bastos, MD;  Location: [facility];  Service: Orthopedics;  Laterality: Right;  Right Shoulder Rotator Cuff Repair complex    . Dilation and curettage of uterus      FAMILY HISTORY Family History  Problem Relation Age of Onset  . Diabetes Mother   . Breast cancer Sister     Age unknown  . Diabetes Sister   . Diabetes Brother   . Breast cancer Maternal Grandmother     Age unknown   The patient's maternal grandmother died from postmenopausal breast cancer.  One of the patient's mothers four sisters also had breast cancer, also postmenopausal.  The patient's father, who had an alcohol problem and was a smoker, died at the age of 68.  The patient's mother died in the setting of dementia with renal failure and diabetes at the age of 45.  The patient had 6 sisters and 3 brothers.  One sister had breast cancer diagnosed in her late 20s.  Another sister may have had cancer of the uterus.  It is not clear to the patient.  There is no other cancer in the family that she is aware, although the patient's mother did have a "malignant colon polyp" removed.  GYNECOLOGIC HISTORY:  The patient is GX, P6.  First pregnancy to term, age 55.  She received hormones for 2-3 years after menopause in her late 89W with no complications.  SOCIAL HISTORY:     ADVANCED DIRECTIVES: Not in place  HEALTH MAINTENANCE: (Updated 07/13/2013) History  Substance Use Topics  . Smoking status: Former Smoker     Quit date: 04/06/1961  . Smokeless tobacco: Never Used  . Alcohol Use: No     Colonoscopy:  Not on file  PAP: January 2013  Bone density: August 2013, Normal  Lipid panel: Not on file    Allergies  Allergen Reactions  . Shellfish Allergy Swelling  . Pantoprazole Sodium Palpitations  . Sulfur Swelling and Rash    Current Outpatient Prescriptions  Medication Sig Dispense Refill  . allopurinol (ZYLOPRIM) 300 MG tablet Take 150 mg by mouth daily.      Marland Kitchen amLODipine (NORVASC) 2.5 MG tablet Take 2.5 mg by mouth daily. States takes 1100 AM      . aspirin 81 MG tablet Take 81 mg by mouth every other day.       Marland Kitchen  Calcium Carbonate-Vitamin D (CALCIUM + D PO) Take 600 Units by mouth daily.       . carvedilol (COREG) 12.5 MG tablet TAKE 1 TABLET TWICE DAILY WITH A MEAL.  60 tablet  0  . Cholecalciferol (VITAMIN D) 1000 UNITS capsule Take 1,000 Units by mouth daily.       . Coenzyme Q10 (CO Q 10 PO) Take 1 tablet by mouth daily.       Marland Kitchen HYDROcodone-acetaminophen (NORCO) 5-325 MG per tablet Take 1 tablet by mouth every 8 (eight) hours as needed.      Marland Kitchen letrozole (FEMARA) 2.5 MG tablet Take 1 tablet (2.5 mg total) by mouth daily.  90 tablet  12  . losartan (COZAAR) 100 MG tablet Take 100 mg by mouth at bedtime.      . Multiple Vitamin (MULTIVITAMIN) tablet Take 1 tablet by mouth daily.       . rosuvastatin (CRESTOR) 20 MG tablet Take 10 mg by mouth every evening.       Marland Kitchen EPIPEN 2-PAK 0.3 MG/0.3ML DEVI       . promethazine-codeine (PHENERGAN WITH CODEINE) 6.25-10 MG/5ML syrup Take 5 mLs by mouth every 4 (four) hours as needed. COUGH        No current facility-administered medications for this visit.    OBJECTIVE: Middle-aged [place] American woman who appears well and in no acute distress Filed Vitals:   07/13/13 0911  BP: 164/84  Pulse: 67  Temp: 98.5 F (36.9 C)  Resp: 18     Body mass index is 41.53 kg/(m^2).    ECOG FS: 1 Filed Weights   07/13/13 0911  Weight: 257 lb 3.2 oz  (116.665 kg)   Physical Exam: HEENT:  Sclerae anicteric.  Oropharynx clear and moist. Neck supple, trachea midline. No thyromegaly.  NODES:  No cervical or supraclavicular lymphadenopathy palpated.  BREAST EXAM: Right breast is unremarkable. Left breast is status post lumpectomy and radiation therapy via MammoSite. No suspicious nodularities or skin changes. No evidence of local recurrence. Axillae are benign bilaterally palpable lymphadenopathy. LUNGS:  Clear to auscultation bilaterally.  No crackles, wheezes, or rhonchi HEART:  Regular rate and rhythm.  ABDOMEN:  Soft, obese, nontender.  Positive bowel sounds.  MSK:  No focal spinal tenderness to palpation. Limited range of motion bilaterally in the upper extremities secondary to shoulder pain EXTREMITIES: Nonpitting pedal edema bilaterally, equal bilaterally. No upper extremity edema noted. No lymphedema in the left upper extremity. SKIN:  Benign with no visible rashes or skin lesions. No excessive ecchymoses. No petechiae. NEURO:  Nonfocal. Well oriented.  Appropriate affect.   LAB RESULTS: Lab Results  Component Value Date   WBC 10.8* 07/06/2013   NEUTROABS 7.7* 07/06/2013   HGB 11.9 07/06/2013   HCT 36.8 07/06/2013   MCV 83.1 07/06/2013   PLT 241 07/06/2013      Chemistry      Component Value Date/Time   NA 140 07/06/2013 0900   NA 140 07/14/2011 0928   K 3.9 07/06/2013 0900   K 3.8 07/14/2011 0928   CL 106 07/05/2012 1101   CL 104 07/14/2011 0928   CO2 20* 07/06/2013 0900   CO2 24 07/14/2011 0928   BUN 21.2 07/06/2013 0900   BUN 27* 07/14/2011 0928   CREATININE 1.4* 07/06/2013 0900   CREATININE 1.40* 07/14/2011 0928      Component Value Date/Time   CALCIUM 9.4 07/06/2013 0900   CALCIUM 9.0 07/14/2011 0928   ALKPHOS 102 07/06/2013 0900   ALKPHOS 87  07/14/2011 0928   AST 14 07/06/2013 0900   AST 15 07/14/2011 0928   ALT 13 07/06/2013 0900   ALT 14 07/14/2011 0928   BILITOT 0.37 07/06/2013 0900   BILITOT 0.3 07/14/2011 0928       Lab Results  Component Value  Date   LABCA2 30 12/26/2010    STUDIES: Most recent bilateral mammogram on 12/01/2012 was unremarkable.  ASSESSMENT: 79 y.o.  [place] woman   (1)  status post left lumpectomy and sentinel lymph node sampling in April 2010 for a T1c N0, grade 2 invasive ductal carcinoma, estrogen and progesterone receptor-positive, HER-2 also amplified,   (2)  status post MammoSite radiation, completed in April 2010   (3)  on letrozole since May 2010 with good tolerance, plan being to continue for total of 5 years. Bone density 11/24/2011 normal.  PLAN:  Vicki is ready to "graduate" from followup at this time. She will complete out the bottle of letrozole she has at home, then discontinue that medication. She has done extremely well with her breast cancer treatment, and we expect a good prognosis in the future. She does understand that we will keep all of her records for the next 10 years should she have any questions or problems in the future. In the meanwhile, we will refer her back to her primary care physician, Dr. Joylene [name], for any necessary medical care.  I will mention that her white blood cells and neutrophils were slightly elevated one week ago on 07/06/2013. A repeat a CBC today which showed that both of these had normalized, with a WBC of 8.1 and ANC of 5.2 today.  All of the above was reviewed with Tamarra who voices her understanding and agreement with this plan. She'll call with any changes or problems prior to her next appointment here. She is doing terrific from a breast cancer point of view, with no evidence of recurrence now 4 years out from her definitive surgery. The plan is to continue letrozole an additional year, and then release her to Dr. Silvestre Mesi care. She knows to call for any problems that may develop before the next visit.   Zadkiel Dragan Milda Smart    07/13/2013

## 2013-07-13 NOTE — Telephone Encounter (Signed)
Called pt to inform her that labs were normal. Pt was very pleased with lab numbers. Pt expressed her thanks to the staff here. Message to be forwarded to [employer], PA-C.

## 2013-07-17 ENCOUNTER — Encounter: Payer: Self-pay | Admitting: Oncology

## 2013-07-17 NOTE — Progress Notes (Signed)
Patient left a message about her bills. I called her and gave her billing ph#. She has no balance with us,but mentioned the discnt she had before. I advised her all had changed but to call and they can set her up on pmts if need be

## 2013-07-24 ENCOUNTER — Encounter: Payer: Self-pay | Admitting: Cardiovascular Disease

## 2013-07-24 ENCOUNTER — Ambulatory Visit (INDEPENDENT_AMBULATORY_CARE_PROVIDER_SITE_OTHER): Payer: Medicare Other | Admitting: Cardiovascular Disease

## 2013-07-24 VITALS — BP 130/60 | HR 64 | Ht 66.0 in | Wt 261.4 lb

## 2013-07-24 DIAGNOSIS — I10 Essential (primary) hypertension: Secondary | ICD-10-CM

## 2013-07-24 NOTE — Progress Notes (Signed)
Vicki [name] Date of Birth  1934-06-22 [place] [address]    [place]   [place], [place]  [address]    [place], [place]  [address] [phone]  Fax  [phone]  [phone]  Fax [phone]  Problem list: 1. Hypertension 2. History of breast cancer 3. Shortness of breath 4. Leg edema   History of Present Illness:  [name] is a 79 yo with a hx of HTN, breast cancer.  She presents earlier than expected to today for pre-op evaluation prior to having right shoulder surgery.  She injured her right shoulder years ago.  She fell recently and injured her left shoulder ( which has already been reapired.)  Denies any chest pain or dyspnea.  She exercises on an intermittant basis.  Feb. 24, 2014:  She has been having some more leg edema.  She is still eating salt.  July 24, 2013:  She has been doing ok.  Lots of noncardiac problems.  Has had some knee problems.      Current Outpatient Prescriptions on File Prior to Visit  Medication Sig Dispense Refill  . allopurinol (ZYLOPRIM) 300 MG tablet Take 150 mg by mouth daily.      Marland Kitchen amLODipine (NORVASC) 2.5 MG tablet Take 2.5 mg by mouth daily. States takes 1100 AM      . aspirin 81 MG tablet Take 81 mg by mouth every other day.       . Calcium Carbonate-Vitamin D (CALCIUM + D PO) Take 600 Units by mouth daily.       . carvedilol (COREG) 12.5 MG tablet TAKE 1 TABLET TWICE DAILY WITH A MEAL.  60 tablet  0  . Cholecalciferol (VITAMIN D) 1000 UNITS capsule Take 1,000 Units by mouth daily.       . Coenzyme Q10 (CO Q 10 PO) Take 1 tablet by mouth daily.       Marland Kitchen EPIPEN 2-PAK 0.3 MG/0.3ML DEVI       . HYDROcodone-acetaminophen (NORCO) 5-325 MG per tablet Take 1 tablet by mouth every 8 (eight) hours as needed.      Marland Kitchen letrozole (FEMARA) 2.5 MG tablet Take 1 tablet (2.5 mg total) by mouth daily.  90 tablet  12  . losartan (COZAAR) 100 MG tablet Take 100 mg by mouth at bedtime.       . Multiple Vitamin (MULTIVITAMIN) tablet Take 1 tablet by mouth daily.       . promethazine-codeine (PHENERGAN WITH CODEINE) 6.25-10 MG/5ML syrup Take 5 mLs by mouth every 4 (four) hours as needed. COUGH       . rosuvastatin (CRESTOR) 20 MG tablet Take 10 mg by mouth every evening.        No current facility-administered medications on file prior to visit.    Allergies  Allergen Reactions  . Shellfish Allergy Swelling  . Pantoprazole Sodium Palpitations  . Sulfur Swelling and Rash    Past Medical History  Diagnosis Date  . Bradycardia   . Chest pain   . Obesity   . Asthma     none in years  . Sleep apnea     states setting on 11- followed by Dr Westley Hummer yearly- last study 7 years ago  . GERD (gastroesophageal reflux disease)   . Arthritis     osteo  . Gout   . Chronic kidney disease     states elevated creatnine- followed by Dr Joylene Draft  .  Breast cancer 2010    Left  . Complication of anesthesia     following tympanoplasty in 1997- "HAD TIGHTENING AROUND CHEST FOLLOWING ANESTHESIA'- states has done well x 2 since  . Hypertension     LOV, clearance Dr Cathie Olden with note 2/13 EPIC, EKG and Chest  10/12 EPIC  . Diabetes mellitus     borderline/diet controlled  . Sleep apnea with use of continuous positive airway pressure (CPAP) 01/24/2013    Past Surgical History  Procedure Laterality Date  . Breast lumpectomy  07/2008    Left - Dr Margot Chimes  . Tonsillectomy    . Tympanoplasty  1997    right  . Carpal tunnel release      right  . Shoulder open rotator cuff repair  05/27/2011    Procedure: ROTATOR CUFF REPAIR SHOULDER OPEN;  Surgeon: Tobi Bastos, MD;  Location: [facility];  Service: Orthopedics;  Laterality: Right;  Right Shoulder Rotator Cuff Repair complex    . Dilation and curettage of uterus      History  Smoking status  . Former Smoker  . Quit date: 04/06/1961  Smokeless tobacco  . Never Used    History  Alcohol Use No    Family History  Problem  Relation Age of Onset  . Diabetes Mother   . Breast cancer Sister     Age unknown  . Diabetes Sister   . Diabetes Brother   . Breast cancer Maternal Grandmother     Age unknown    Reviw of Systems:  Reviewed in the HPI.  All other systems are negative.  Physical Exam: Blood pressure 130/60, pulse 64, height 5\' 6"  (1.676 m), weight 261 lb 6.4 oz (118.57 kg). General: Well developed, well nourished, in no acute distress.  Head: Normocephalic, atraumatic, sclera non-icteric, mucus membranes are moist,   Neck: Supple. Negative for carotid bruits. JVD not elevated.  Lungs: Clear bilaterally to auscultation without wheezes, rales, or rhonchi. Breathing is unlabored.  Heart: RRR with S1 S2. No murmurs, rubs, or gallops appreciated.  Abdomen: Soft, non-tender, non-distended with normoactive bowel sounds. No hepatomegaly. No rebound/guarding. No obvious abdominal masses.  Msk:  Strength and tone appear normal for age.  Extremities: No clubbing or cyanosis. 1+  edema.  Distal pedal pulses are 2+ and equal bilaterally.  Neuro: Alert and oriented X 3. Moves all extremities spontaneously.  Psych:  Responds to questions appropriately with a normal affect.  ECG: July 24, 2013:  NSR at 71.  Normal ECG  Assessment / Plan:

## 2013-07-24 NOTE — Assessment & Plan Note (Signed)
The patient's blood pressure remains well controlled. Continue with her current medications. I think curvature to work on a good diet, exercise, and weight loss program. She is limited by her knee pain and arthritis.  She has her lab work through Dr. Joylene Draft.  Will see her in 1 year for OV and labs.

## 2013-07-24 NOTE — Patient Instructions (Addendum)
Your physician recommends that you continue on your current medications as directed. Please refer to the Current Medication list given to you today.  Your physician wants you to follow-up in: 1 year with Dr. Acie Fredrickson.  You will receive a reminder letter in the mail two months in advance. If you don't receive a letter, please call our office to schedule the follow-up appointment.  Your physician recommends that you return for a FASTING lipid profile: in 1 year when you see Dr. Acie Fredrickson

## 2013-07-27 ENCOUNTER — Other Ambulatory Visit: Payer: Self-pay | Admitting: Cardiovascular Disease

## 2013-09-26 ENCOUNTER — Telehealth: Payer: Self-pay | Admitting: *Deleted

## 2013-09-26 NOTE — Telephone Encounter (Signed)
This RN returned call to pt per her message " I was told to not take any more of the femara when I finish this bottle because I was released from having cancer in April and will not need it anymore"  This RN reviewed documentation per visit with AB/PA in April 2015 and call patient to verify with her above is correct.  This RN validated and congratulated pt on her 5 year achievement.  No other needs at this time.

## 2013-10-10 ENCOUNTER — Encounter: Payer: Medicare Other | Admitting: Gynecology

## 2013-10-30 ENCOUNTER — Other Ambulatory Visit: Payer: Self-pay | Admitting: Gynecology

## 2013-10-30 ENCOUNTER — Other Ambulatory Visit: Payer: Self-pay | Admitting: Oncology

## 2013-10-30 DIAGNOSIS — Z853 Personal history of malignant neoplasm of breast: Secondary | ICD-10-CM

## 2013-11-20 ENCOUNTER — Ambulatory Visit (INDEPENDENT_AMBULATORY_CARE_PROVIDER_SITE_OTHER): Payer: Medicare Other | Admitting: Gynecology

## 2013-11-20 ENCOUNTER — Encounter: Payer: Self-pay | Admitting: Gynecology

## 2013-11-20 VITALS — BP 124/84 | Ht 65.0 in | Wt 263.0 lb

## 2013-11-20 DIAGNOSIS — C50919 Malignant neoplasm of unspecified site of unspecified female breast: Secondary | ICD-10-CM

## 2013-11-20 DIAGNOSIS — N952 Postmenopausal atrophic vaginitis: Secondary | ICD-10-CM

## 2013-11-20 NOTE — Patient Instructions (Signed)
Followup in one year for annual exam, sooner if any issues.  You may obtain a copy of any labs that were done today by logging onto MyChart as outlined in the instructions provided with your AVS (after visit summary). The office will not call with normal lab results but certainly if there are any significant abnormalities then we will contact you.   Health Maintenance, Female A healthy lifestyle and preventative care can promote health and wellness.  Maintain regular health, dental, and eye exams.  Eat a healthy diet. Foods like vegetables, fruits, whole grains, low-fat dairy products, and lean protein foods contain the nutrients you need without too many calories. Decrease your intake of foods high in solid fats, added sugars, and salt. Get information about a proper diet from your caregiver, if necessary.  Regular physical exercise is one of the most important things you can do for your health. Most adults should get at least 150 minutes of moderate-intensity exercise (any activity that increases your heart rate and causes you to sweat) each week. In addition, most adults need muscle-strengthening exercises on 2 or more days a week.   Maintain a healthy weight. The body mass index (BMI) is a screening tool to identify possible weight problems. It provides an estimate of body fat based on height and weight. Your caregiver can help determine your BMI, and can help you achieve or maintain a healthy weight. For adults 20 years and older:  A BMI below 18.5 is considered underweight.  A BMI of 18.5 to 24.9 is normal.  A BMI of 25 to 29.9 is considered overweight.  A BMI of 30 and above is considered obese.  Maintain normal blood lipids and cholesterol by exercising and minimizing your intake of saturated fat. Eat a balanced diet with plenty of fruits and vegetables. Blood tests for lipids and cholesterol should begin at age 20 and be repeated every 5 years. If your lipid or cholesterol levels are  high, you are over 50, or you are a high risk for heart disease, you may need your cholesterol levels checked more frequently.Ongoing high lipid and cholesterol levels should be treated with medicines if diet and exercise are not effective.  If you smoke, find out from your caregiver how to quit. If you do not use tobacco, do not start.  Lung cancer screening is recommended for adults aged 55 80 years who are at high risk for developing lung cancer because of a history of smoking. Yearly low-dose computed tomography (CT) is recommended for people who have at least a 30-pack-year history of smoking and are a current smoker or have quit within the past 15 years. A pack year of smoking is smoking an average of 1 pack of cigarettes a day for 1 year (for example: 1 pack a day for 30 years or 2 packs a day for 15 years). Yearly screening should continue until the smoker has stopped smoking for at least 15 years. Yearly screening should also be stopped for people who develop a health problem that would prevent them from having lung cancer treatment.  If you are pregnant, do not drink alcohol. If you are breastfeeding, be very cautious about drinking alcohol. If you are not pregnant and choose to drink alcohol, do not exceed 1 drink per day. One drink is considered to be 12 ounces (355 mL) of beer, 5 ounces (148 mL) of wine, or 1.5 ounces (44 mL) of liquor.  Avoid use of street drugs. Do not share needles with   anyone. Ask for help if you need support or instructions about stopping the use of drugs.  High blood pressure causes heart disease and increases the risk of stroke. Blood pressure should be checked at least every 1 to 2 years. Ongoing high blood pressure should be treated with medicines, if weight loss and exercise are not effective.  If you are 55 to 77 years old, ask your caregiver if you should take aspirin to prevent strokes.  Diabetes screening involves taking a blood sample to check your fasting  blood sugar level. This should be done once every 3 years, after age 45, if you are within normal weight and without risk factors for diabetes. Testing should be considered at a younger age or be carried out more frequently if you are overweight and have at least 1 risk factor for diabetes.  Breast cancer screening is essential preventative care for women. You should practice "breast self-awareness." This means understanding the normal appearance and feel of your breasts and may include breast self-examination. Any changes detected, no matter how small, should be reported to a caregiver. Women in their 20s and 30s should have a clinical breast exam (CBE) by a caregiver as part of a regular health exam every 1 to 3 years. After age 40, women should have a CBE every year. Starting at age 40, women should consider having a mammogram (breast X-ray) every year. Women who have a family history of breast cancer should talk to their caregiver about genetic screening. Women at a high risk of breast cancer should talk to their caregiver about having an MRI and a mammogram every year.  Breast cancer gene (BRCA)-related cancer risk assessment is recommended for women who have family members with BRCA-related cancers. BRCA-related cancers include breast, ovarian, tubal, and peritoneal cancers. Having family members with these cancers may be associated with an increased risk for harmful changes (mutations) in the breast cancer genes BRCA1 and BRCA2. Results of the assessment will determine the need for genetic counseling and BRCA1 and BRCA2 testing.  The Pap test is a screening test for cervical cancer. Women should have a Pap test starting at age 21. Between ages 21 and 29, Pap tests should be repeated every 2 years. Beginning at age 30, you should have a Pap test every 3 years as long as the past 3 Pap tests have been normal. If you had a hysterectomy for a problem that was not cancer or a condition that could lead to  cancer, then you no longer need Pap tests. If you are between ages 65 and 70, and you have had normal Pap tests going back 10 years, you no longer need Pap tests. If you have had past treatment for cervical cancer or a condition that could lead to cancer, you need Pap tests and screening for cancer for at least 20 years after your treatment. If Pap tests have been discontinued, risk factors (such as a new sexual partner) need to be reassessed to determine if screening should be resumed. Some women have medical problems that increase the chance of getting cervical cancer. In these cases, your caregiver may recommend more frequent screening and Pap tests.  The human papillomavirus (HPV) test is an additional test that may be used for cervical cancer screening. The HPV test looks for the virus that can cause the cell changes on the cervix. The cells collected during the Pap test can be tested for HPV. The HPV test could be used to screen women aged 30   years and older, and should be used in women of any age who have unclear Pap test results. After the age of 30, women should have HPV testing at the same frequency as a Pap test.  Colorectal cancer can be detected and often prevented. Most routine colorectal cancer screening begins at the age of 50 and continues through age 75. However, your caregiver may recommend screening at an earlier age if you have risk factors for colon cancer. On a yearly basis, your caregiver may provide home test kits to check for hidden blood in the stool. Use of a small camera at the end of a tube, to directly examine the colon (sigmoidoscopy or colonoscopy), can detect the earliest forms of colorectal cancer. Talk to your caregiver about this at age 50, when routine screening begins. Direct examination of the colon should be repeated every 5 to 10 years through age 75, unless early forms of pre-cancerous polyps or small growths are found.  Hepatitis C blood testing is recommended for  all people born from 1945 through 1965 and any individual with known risks for hepatitis C.  Practice safe sex. Use condoms and avoid high-risk sexual practices to reduce the spread of sexually transmitted infections (STIs). Sexually active women aged 25 and younger should be checked for Chlamydia, which is a common sexually transmitted infection. Older women with new or multiple partners should also be tested for Chlamydia. Testing for other STIs is recommended if you are sexually active and at increased risk.  Osteoporosis is a disease in which the bones lose minerals and strength with aging. This can result in serious bone fractures. The risk of osteoporosis can be identified using a bone density scan. Women ages 65 and over and women at risk for fractures or osteoporosis should discuss screening with their caregivers. Ask your caregiver whether you should be taking a calcium supplement or vitamin D to reduce the rate of osteoporosis.  Menopause can be associated with physical symptoms and risks. Hormone replacement therapy is available to decrease symptoms and risks. You should talk to your caregiver about whether hormone replacement therapy is right for you.  Use sunscreen. Apply sunscreen liberally and repeatedly throughout the day. You should seek shade when your shadow is shorter than you. Protect yourself by wearing long sleeves, pants, a wide-brimmed hat, and sunglasses year round, whenever you are outdoors.  Notify your caregiver of new moles or changes in moles, especially if there is a change in shape or color. Also notify your caregiver if a mole is larger than the size of a pencil eraser.  Stay current with your immunizations. Document Released: 10/06/2010 Document Revised: 07/18/2012 Document Reviewed: 10/06/2010 [employer] Patient Information 2014 [employer].   

## 2013-11-20 NOTE — Progress Notes (Signed)
Vicki [name] 1935/02/21 [id number]        79 y.o.  [id number] for followup exam.  Past medical history,surgical history, problem list, medications, allergies, family history and social history were all reviewed and documented as reviewed in the EPIC chart.  ROS:  12 system ROS performed with pertinent positives and negatives included in the history, assessment and plan.   Additional significant findings :  None   Exam: [name] Vitals:   11/20/13 1425  BP: 124/84  Height: 5\' 5"  (1.651 m)  Weight: 263 lb (119.296 kg)   General appearance:  Normal affect, orientation and appearance. Skin: Grossly normal HEENT: Without gross lesions.  No cervical or supraclavicular adenopathy. Thyroid normal.  Lungs:  Clear without wheezing, rales or rhonchi Cardiac: RR, without RMG Abdominal:  Soft, nontender, without masses, guarding, rebound, organomegaly or hernia Breasts:  Examined lying and sitting without masses, retractions, discharge or axillary adenopathy. Well-healed left lumpectomy scar Pelvic:  Ext/BUS/vagina with generalized atrophic changes  Cervix with atrophic changes  Uterus grossly normal size, midline mobile, nontender  Adnexa  Without gross masses or tenderness    Anus and perineum  Normal   Rectovaginal  Normal sphincter tone without palpated masses or tenderness.    Assessment/Plan:  79 y.o. [id number] female for followup exam.   1. Postmenopausal. Atrophic genital changes. Patient without significant symptoms of hot flushes, night sweats, vaginal dryness. Is not sexually active. No bleeding. Will continue to monitor. Report any vaginal bleeding. 2. History of left breast cancer status post lumpectomy. Has finished her Femara and is being followed expectantly. Mammogram scheduled for next week. SBE monthly reviewed. 3. Pap smear 2013. No Pap smear done today. No history of significant abnormal Pap smears. Again reviewed current screening guidelines and patients  comfortable to stop screening as she is over the age of 98. 4. Colonoscopy 4 years ago. Has appointment to see Dr. Sarina Ser next week due to some fleeting abdominal discomfort and constipation. We'll followup with him for this. 5. Bone density 2013 normal at her primary physician's office. We'll continue to follow up with them in reference to this. Increased calcium/vitamin D recommendations reviewed. 6. Health maintenance. No routine blood work done as she has this done at her primary physician's office. Followup one year, sooner as needed.   Note: This document was prepared with digital dictation and possible smart phrase technology. Any transcriptional errors that result from this process are unintentional.   [name], 2:53 PM 11/20/2013

## 2013-12-04 ENCOUNTER — Ambulatory Visit
Admission: RE | Admit: 2013-12-04 | Discharge: 2013-12-04 | Disposition: A | Discharge status: Home / Self Care | Payer: Medicare Other | Source: Ambulatory Visit | Attending: Gynecology | Admitting: Gynecology

## 2013-12-04 DIAGNOSIS — Z853 Personal history of malignant neoplasm of breast: Secondary | ICD-10-CM

## 2013-12-18 ENCOUNTER — Telehealth: Payer: Self-pay | Admitting: *Deleted

## 2013-12-18 ENCOUNTER — Encounter: Payer: Self-pay | Admitting: *Deleted

## 2013-12-18 NOTE — Telephone Encounter (Signed)
Order for bras faxed to [employer] [facility]. Sent to be scanned.

## 2014-01-22 ENCOUNTER — Encounter: Payer: Self-pay | Admitting: Neurology

## 2014-01-22 ENCOUNTER — Ambulatory Visit (INDEPENDENT_AMBULATORY_CARE_PROVIDER_SITE_OTHER): Payer: Medicare Other | Admitting: Neurology

## 2014-01-22 VITALS — BP 159/54 | HR 67 | Temp 97.6°F | Resp 12 | Ht 65.0 in | Wt 269.8 lb

## 2014-01-22 DIAGNOSIS — G4733 Obstructive sleep apnea (adult) (pediatric): Secondary | ICD-10-CM | POA: Insufficient documentation

## 2014-01-22 DIAGNOSIS — E662 Morbid (severe) obesity with alveolar hypoventilation: Secondary | ICD-10-CM | POA: Insufficient documentation

## 2014-01-22 DIAGNOSIS — Z9989 Dependence on other enabling machines and devices: Principal | ICD-10-CM

## 2014-01-22 NOTE — Patient Instructions (Signed)
Obesity Obesity is having too much body fat and a body mass index (BMI) of 30 or more. BMI is a number based on your height and weight. The number is an estimate of how much body fat you have. Obesity can happen if you eat more calories than you can burn by exercising or other activity. It can cause major health problems or emergencies.  HOME CARE  Exercise and be active as told by your doctor. Try:  Using stairs when you can.  Parking farther away from store doors.  Gardening, biking, or walking.  Eat healthy foods and drinks that are low in calories. Eat more fruits and vegetables.  Limit fast food, sweets, and snack foods that are made with ingredients that are not natural (processed food).  Eat smaller amounts of food.  Keep a journal and write down what you eat every day. Websites can help with this.  Avoid drinking alcohol. Drink more water and drinks without calories.   Take vitamins and dietary pills (supplements) only as told by your doctor.  Try going to weight-loss support groups or classes to help lessen stress. Dietitians and counselors may also help. GET HELP RIGHT AWAY IF:  You have chest pain or tightness.  You have trouble breathing or feel short of breath.  You feel weak or have loss of feeling (numbness) in your legs.  You feel confused or have trouble talking.  You have sudden changes in your vision. MAKE SURE YOU:  Understand these instructions.  Will watch your condition.  Will get help right away if you are not doing well or get worse. Document Released: 06/15/2011 Document Revised: 08/07/2013 Document Reviewed: 06/15/2011 [facility] Patient Information 2015 [place], [place]. This information is not intended to replace advice given to you by your health care provider. Make sure you discuss any questions you have with your health care provider.

## 2014-01-22 NOTE — Progress Notes (Signed)
[facility]  Provider:  Larey Seat, M D  Referring Provider: Jerlyn Ly, MD Primary Care Physician:  Jerlyn Ly, MD  Chief Complaint  Patient presents with  . RV Sleep    RM 13, Alone    HPI:  Vicki [name] is a 79 y.o. female  Is seen here as a revisit from Dr. Joylene Draft for follow up on Sleep Apnea.  [name] was  diagnosed with obstructive sleep apnea , the sleep study  took place in 2005.  The patient has been using CPAP at 12 cm water setting, she is using it nightly he has a 30 day download available here with 100% compliance.  Residual AHI of 0.7. The 12 cm water setting is unchanged in previous years.  Nightly time and therapy is 8 hours and 32 minutes.  Epworth sleepiness score is endorsed at 4 points, or fatigue severity score however which is 44 points and elevated.  Review of her medicine shows that she is still taking Vicodin for shoulder pain as needed.  She has sciatica and takes prednisone .  She is still on Allopurinol for the treatment of gout and of Norvasc for blood pressure control she's also on Coreg, Femara , and Crestor.  Dr. Joylene Draft reduced her Crestor from 20 to 10 mg after she developed myalgia. Now she has further reduced the dose to every other day.     Review of Systems: Out of a complete 14 system review, the patient complains of only the following symptoms, and all other reviewed systems are negative. The patient had recently surgery does a rotator cuff right and left him a interradial systems she and her was joint pain ongoing sometimes feeling hot ,  having leg swelling she also has insomnia, and her skin has developed some moles.  She continues to gain weight.  She is retired, and she is followed by Dr. Jana Hakim for her breast cancer.  Dr Trudie Reed , Rheumatology. Dr. Oswald Hillock. and  Dr. Joylene Draft , PCP     History   Social History  . Marital Status: Married    Spouse Name: N/A    Number of  Children: N/A  . Years of Education: N/A   Occupational History  . Not on file.   Social History Main Topics  . Smoking status: Former Smoker    Quit date: 04/06/1961  . Smokeless tobacco: Never Used  . Alcohol Use: No  . Drug Use: No  . Sexual Activity: No   Other Topics Concern  . Not on file   Social History Narrative   Right handed, Married, 6 kids, Caffeine 1 cup daily, Retired,  [employer] grad,     Family History  Problem Relation Age of Onset  . Diabetes Mother   . Breast cancer Sister     Age unknown  . Diabetes Sister   . Diabetes Brother   . Breast cancer Maternal Grandmother     Age unknown    Past Medical History  Diagnosis Date  . Bradycardia   . Chest pain   . Obesity   . Asthma     none in years  . Sleep apnea     states setting on 11- followed by Dr Westley Hummer yearly- last study 7 years ago  . GERD (gastroesophageal reflux disease)   . Arthritis     osteo  . Gout   . Chronic kidney disease     states elevated creatnine- followed by Dr  Perini  . Breast cancer 2010    Left  . Complication of anesthesia     following tympanoplasty in 1997- "HAD TIGHTENING AROUND CHEST FOLLOWING ANESTHESIA'- states has done well x 2 since  . Hypertension   . Diabetes mellitus     borderline/diet controlled  . Sleep apnea with use of continuous positive airway pressure (CPAP) 01/24/2013    Past Surgical History  Procedure Laterality Date  . Breast lumpectomy  07/2008    Left - Dr Margot Chimes  . Tonsillectomy    . Tympanoplasty  1997    right  . Carpal tunnel release      right  . Shoulder open rotator cuff repair  05/27/2011    Procedure: ROTATOR CUFF REPAIR SHOULDER OPEN;  Surgeon: Tobi Bastos, MD;  Location: [facility];  Service: Orthopedics;  Laterality: Right;  Right Shoulder Rotator Cuff Repair complex    . Dilation and curettage of uterus      Current Outpatient Prescriptions  Medication Sig Dispense Refill  . allopurinol (ZYLOPRIM) 300 MG tablet Take 150  mg by mouth daily.      Marland Kitchen amLODipine (NORVASC) 2.5 MG tablet Take 2.5 mg by mouth daily. States takes 1100 AM      . aspirin 81 MG tablet Take 81 mg by mouth every other day.       . Calcium Carbonate-Vitamin D (CALCIUM + D PO) Take 600 Units by mouth daily.       . carvedilol (COREG) 12.5 MG tablet TAKE 1 TABLET TWICE DAILY WITH A MEAL.  60 tablet  5  . Cholecalciferol (VITAMIN D) 1000 UNITS capsule Take 1,000 Units by mouth daily.       . Coenzyme Q10 (CO Q 10 PO) Take 1 tablet by mouth daily.       Marland Kitchen EPIPEN 2-PAK 0.3 MG/0.3ML DEVI       . HYDROcodone-acetaminophen (NORCO) 5-325 MG per tablet Take 1 tablet by mouth every 8 (eight) hours as needed.      Marland Kitchen losartan (COZAAR) 100 MG tablet Take 100 mg by mouth at bedtime.      . Multiple Vitamin (MULTIVITAMIN) tablet Take 1 tablet by mouth daily.       . rosuvastatin (CRESTOR) 20 MG tablet Take 10 mg by mouth every evening.        No current facility-administered medications for this visit.    Allergies as of 01/22/2014 - Review Complete 01/22/2014  Allergen Reaction Noted  . Shellfish allergy Swelling 10/04/2012  . Pantoprazole sodium Palpitations 07/23/2010  . Sulfur Swelling and Rash 07/23/2010    Vitals: BP 159/54  Pulse 67  Temp(Src) 97.6 F (36.4 C) (Oral)  Resp 12  Ht 5\' 5"  (1.651 m)  Wt 269 lb 12 oz (122.358 kg)  BMI 44.89 kg/m2 Last Weight:  Wt Readings from Last 1 Encounters:  01/22/14 269 lb 12 oz (122.358 kg)   Last Height:   Ht Readings from Last 1 Encounters:  01/22/14 5\' 5"  (1.651 m)    Physical exam:  General: The patient is awake, alert and appears not in acute distress. The patient is well groomed. Head: Normocephalic, atraumatic. Neck is supple. Mallampati 3  neck circumference: 17.00. Inches  Cardiovascular:  Regular rate and rhythm, without  murmurs or carotid bruit, and without distended neck veins. Respiratory: Lungs are clear to auscultation. Skin: Evidence of edema,  Puffy feet, ankles and  hands  , no rash Trunk: BMI is elevated , she has normal posture.  Neurologic  exam : The patient is awake and alert, oriented to place and time.   Memory subjective described as intact. There is a normal attention span & concentration ability. Speech is fluent without dysarthria, dysphonia or aphasia. Mood and affect are appropriate. Cranial nerves: Pupils are equal and briskly reactive to light. Funduscopic exam without  evidence of pallor or edema. Extraocular movements  in vertical and horizontal planes intact and without nystagmus. Visual fields by finger perimetry are intact. Hearing to finger rub intact.  Facial sensation intact to fine touch. Facial motor strength is symmetric and tongue and uvula move midline. Motor : Unable to raise either arm above shoulder level. Restricted shoulder range of motion. Decreased grip strength. Attenuated reflexes of the ankles, patella.  Coordination: Rapid alternating movements in the fingers/hands is tested and normal. Finger-to-nose maneuver without evidence of ataxia, dysmetria or tremor. Gait and station: Patient walks without assistive device. Stance is wide based.Tandem gait is impossible . She has pain in both legs , a deep achy and sore feeling that only gradually improved after Crestor reduction. Romberg testing is normal. Deep tendon reflexes: in the  upper and lower extremities are attenuated, symmetric. Babinski maneuver response is downgoing.  Assessment:  After physical and neurologic examination, review of laboratory studies, imaging, neurophysiology testing and pre-existing records, assessment is   that of OSA, 1) OSA on CPAP - OSA well controlled on current settings, and with reduced EDS. Highly compliant patient 01-22-14 , 100 % on 12 cm water.  2) High BMI , still gaining weight.  HTN.  3) Blood sugar control is poor. Hematuria reported, labs from Dr Joylene Draft, Gout.  4) Menopausal hot flushes much less frequent after Femara ( therapy over 5  years, concluded 2014)  Plan:  Treatment plan and additional workup :  Weight loss is needed. Patient is doing well with CPAP in a reclined position.  Not longer snoring. Sleeping well. She is unable to tolerate supine sleep - due to BMI  Continue CPAP. Is this patient a candidate for weight loss surgery?   30 minute sleep and Wellness, questions answered.

## 2014-01-24 ENCOUNTER — Ambulatory Visit: Payer: Medicare Other | Admitting: Neurology

## 2014-01-28 ENCOUNTER — Other Ambulatory Visit: Payer: Self-pay | Admitting: Cardiovascular Disease

## 2014-02-05 ENCOUNTER — Encounter: Payer: Self-pay | Admitting: Neurology

## 2014-05-24 ENCOUNTER — Ambulatory Visit (INDEPENDENT_AMBULATORY_CARE_PROVIDER_SITE_OTHER): Payer: Medicare Other | Admitting: Cardiovascular Disease

## 2014-05-24 ENCOUNTER — Encounter: Payer: Self-pay | Admitting: Cardiovascular Disease

## 2014-05-24 VITALS — BP 122/70 | HR 64 | Ht 65.0 in | Wt 266.4 lb

## 2014-05-24 DIAGNOSIS — I10 Essential (primary) hypertension: Secondary | ICD-10-CM

## 2014-05-24 DIAGNOSIS — Z9989 Dependence on other enabling machines and devices: Secondary | ICD-10-CM

## 2014-05-24 DIAGNOSIS — G4733 Obstructive sleep apnea (adult) (pediatric): Secondary | ICD-10-CM

## 2014-05-24 DIAGNOSIS — E669 Obesity, unspecified: Secondary | ICD-10-CM

## 2014-05-24 NOTE — Patient Instructions (Signed)
Your physician recommends that you continue on your current medications as directed. Please refer to the Current Medication list given to you today.  Your physician wants you to follow-up in: 1 year with Dr. Nahser.  You will receive a reminder letter in the mail two months in advance. If you don't receive a letter, please call our office to schedule the follow-up appointment.  

## 2014-05-24 NOTE — Progress Notes (Signed)
Cardiology Office Note   Date:  05/24/2014   ID:  Vicki [name], DOB 18-Dec-1934, MRN [id number]  PCP:  Jerlyn Ly, MD  Cardiologist:   Thayer Headings, MD   Chief Complaint  Patient presents with  . Hypertension    Problem list: 1. Hypertension 2. History of breast cancer 3. Shortness of breath 4. Leg edema  5. Chronic back pain  6. Obstructive sleep apnea.   History of Present Illness: Vicki [name] is a 80 y.o. female who presents for follow up of her HTN.  She has had some back pain for the past several months.  Had a back injection ( steroid injection)  by Dr. Nelva Bush - which has helped   Has lots of leg swelling with the steroid injection.  Also has found that she has lots of upper abdominal pain if she eats too late at night.  BP has been well controlled.     Past Medical History  Diagnosis Date  . Bradycardia   . Chest pain   . Obesity   . Asthma     none in years  . Sleep apnea     states setting on 11- followed by Dr Westley Hummer yearly- last study 7 years ago  . GERD (gastroesophageal reflux disease)   . Arthritis     osteo  . Gout   . Chronic kidney disease     states elevated creatnine- followed by Dr Joylene Draft  . Breast cancer 2010    Left  . Complication of anesthesia     following tympanoplasty in 1997- "HAD TIGHTENING AROUND CHEST FOLLOWING ANESTHESIA'- states has done well x 2 since  . Hypertension   . Diabetes mellitus     borderline/diet controlled  . Sleep apnea with use of continuous positive airway pressure (CPAP) 01/24/2013    Past Surgical History  Procedure Laterality Date  . Breast lumpectomy  07/2008    Left - Dr Margot Chimes  . Tonsillectomy    . Tympanoplasty  1997    right  . Carpal tunnel release      right  . Shoulder open rotator cuff repair  05/27/2011    Procedure: ROTATOR CUFF REPAIR SHOULDER OPEN;  Surgeon: Tobi Bastos, MD;  Location: [facility];  Service: Orthopedics;  Laterality: Right;  Right Shoulder Rotator Cuff  Repair complex    . Dilation and curettage of uterus       Current Outpatient Prescriptions  Medication Sig Dispense Refill  . allopurinol (ZYLOPRIM) 300 MG tablet Take 150 mg by mouth daily.    Marland Kitchen amLODipine (NORVASC) 2.5 MG tablet Take 2.5 mg by mouth daily. States takes 1100 AM    . aspirin 81 MG tablet Take 81 mg by mouth every other day.     . Calcium Carbonate-Vitamin D (CALCIUM + D PO) Take 600 Units by mouth daily.     . carvedilol (COREG) 12.5 MG tablet TAKE 1 TABLET TWICE DAILY WITH A MEAL. 60 tablet 11  . Cholecalciferol (VITAMIN D) 1000 UNITS capsule Take 1,000 Units by mouth daily.     . Coenzyme Q10 (CO Q 10 PO) Take 1 tablet by mouth daily.     Marland Kitchen EPIPEN 2-PAK 0.3 MG/0.3ML DEVI     . HYDROcodone-acetaminophen (NORCO) 5-325 MG per tablet Take 1 tablet by mouth every 8 (eight) hours as needed.    Marland Kitchen losartan (COZAAR) 100 MG tablet Take 100 mg by mouth at bedtime.    . Multiple Vitamin (MULTIVITAMIN) tablet Take  1 tablet by mouth daily.     . rosuvastatin (CRESTOR) 20 MG tablet Take 10 mg by mouth every evening.      No current facility-administered medications for this visit.    Allergies:   Shellfish allergy; Pantoprazole sodium; and Sulfur    Social History:  The patient  reports that she quit smoking about 53 years ago. She has never used smokeless tobacco. She reports that she does not drink alcohol or use illicit drugs.   Family History:  The patient's family history includes Breast cancer in her maternal grandmother and sister; Diabetes in her brother, mother, and sister.    ROS:  Please see the history of present illness.    Review of Systems: Constitutional:  denies fever, chills, diaphoresis, appetite change and fatigue.  HEENT: denies photophobia, eye pain, redness, hearing loss, ear pain, congestion, sore throat, rhinorrhea, sneezing, neck pain, neck stiffness and tinnitus.  Respiratory: denies SOB, DOE, cough, chest tightness, and wheezing.    Cardiovascular: denies chest pain, palpitations and leg swelling.  Gastrointestinal: denies nausea, vomiting, abdominal pain, diarrhea, constipation, blood in stool.  Genitourinary: denies dysuria, urgency, frequency, hematuria, flank pain and difficulty urinating.  Musculoskeletal: denies  myalgias, back pain, joint swelling, arthralgias and gait problem.   Skin: denies pallor, rash and wound.  Neurological: denies dizziness, seizures, syncope, weakness, light-headedness, numbness and headaches.   Hematological: denies adenopathy, easy bruising, personal or family bleeding history.  Psychiatric/ Behavioral: denies suicidal ideation, mood changes, confusion, nervousness, sleep disturbance and agitation.       All other systems are reviewed and negative.    PHYSICAL EXAM: VS:  BP 122/70 mmHg  Pulse 64  Ht 5\' 5"  (1.651 m)  Wt 266 lb 6.4 oz (120.838 kg)  BMI 44.33 kg/m2 , BMI Body mass index is 44.33 kg/(m^2). GEN: Well nourished, well developed, in no acute distress HEENT: normal Neck: no JVD, carotid bruits, or masses Cardiac: RRR; no murmurs, rubs, or gallops,no edema  Respiratory:  clear to auscultation bilaterally, normal work of breathing GI: soft, nontender, nondistended, + BS MS: no deformity or atrophy Skin: warm and dry, no rash Neuro:  Strength and sensation are intact Psych: normal   EKG:  EKG is ordered today. The ekg ordered today demonstrates NSR at 64. No ST or T wave changes.    Recent Labs: 07/06/2013: ALT 13; BUN 21.2; Creatinine 1.4*; Potassium 3.9; Sodium 140 07/13/2013: Hemoglobin 11.8; Platelets 254    Lipid Panel No results found for: CHOL, TRIG, HDL, CHOLHDL, VLDL, LDLCALC, LDLDIRECT    Wt Readings from Last 3 Encounters:  05/24/14 266 lb 6.4 oz (120.838 kg)  01/22/14 269 lb 12 oz (122.358 kg)  11/20/13 263 lb (119.296 kg)      Other studies Reviewed: Additional studies/ records that were reviewed today include: . Review of the above records  demonstrates:    ASSESSMENT AND PLAN:  1. Hypertension - BP is well controlled   2. History of breast cancer  3. Shortness of breath - likely due to her obesity.  4. Leg edema   5. Chronic back pain  6. Obstructive sleep apnea.   Current medicines are reviewed at length with the patient today.  The patient does not have concerns regarding medicines.  The following changes have been made:  no change   Disposition:   FU with me in 1 year.     Signed, Nahser, Wonda Cheng, MD  05/24/2014 4:32 PM    [facility] [id number]  [address] [place], [place]  [address] Phone: [phone]; Fax: [phone]

## 2014-11-09 ENCOUNTER — Other Ambulatory Visit: Payer: Self-pay

## 2014-11-09 ENCOUNTER — Other Ambulatory Visit: Payer: Self-pay | Admitting: Gynecology

## 2014-11-09 DIAGNOSIS — C50912 Malignant neoplasm of unspecified site of left female breast: Secondary | ICD-10-CM

## 2014-12-06 ENCOUNTER — Ambulatory Visit (INDEPENDENT_AMBULATORY_CARE_PROVIDER_SITE_OTHER): Payer: Medicare Other | Admitting: Gynecology

## 2014-12-06 ENCOUNTER — Encounter: Payer: Self-pay | Admitting: Gynecology

## 2014-12-06 VITALS — BP 130/82 | Ht 63.0 in | Wt 257.0 lb

## 2014-12-06 DIAGNOSIS — Z01419 Encounter for gynecological examination (general) (routine) without abnormal findings: Secondary | ICD-10-CM | POA: Diagnosis not present

## 2014-12-06 DIAGNOSIS — C50912 Malignant neoplasm of unspecified site of left female breast: Secondary | ICD-10-CM

## 2014-12-06 DIAGNOSIS — N952 Postmenopausal atrophic vaginitis: Secondary | ICD-10-CM

## 2014-12-06 NOTE — Progress Notes (Signed)
Vicki [name] 1934-04-15 [id number]        80 y.o.  [id number] for breast and pelvic exam.  Past medical history,surgical history, problem list, medications, allergies, family history and social history were all reviewed and documented as reviewed in the EPIC chart.  ROS:  Performed with pertinent positives and negatives included in the history, assessment and plan.   Additional significant findings :  none   Exam: [name] Vitals:   12/06/14 1115  BP: 130/82  Height: 5\' 3"  (1.6 m)  Weight: 257 lb (116.574 kg)   General appearance:  Normal affect, orientation and appearance. Skin: Grossly normal HEENT: Without gross lesions.  No cervical or supraclavicular adenopathy. Thyroid normal.  Lungs:  Clear without wheezing, rales or rhonchi Cardiac: RR, without RMG Abdominal:  Soft, nontender, without masses, guarding, rebound, organomegaly or hernia Breasts:  Examined lying and sitting without masses, retractions, discharge or axillary adenopathy. Pelvic:  Ext/BUS/vagina with atrophic changes  Cervix with atrophic changes  Uterus difficult to palpate the grossly normal size, nontender   Adnexa  Without gross masses or tenderness    Anus and perineum  Normal   Rectovaginal  Normal sphincter tone without palpated masses or tenderness.    Assessment/Plan:  80 y.o. [id number] female for breast and pelvic exam.   1. Postmenopausal/atrophic genital changes. Without significant hot flushes, night sweats, vaginal dryness. No vaginal bleeding. Continue to monitor report any bleeding. 2. History of left breast cancer status post lumpectomy. Exam NED. Mammography due now and patient has scheduled. SBE monthly reviewed. 3. Pap smear 2013. No Pap smear done today. No history of abnormal Pap smears. We discussed current screening guidelines and she is comfortable stop screening based on age. 4. Colonoscopy scheduled end of this month. 5. DEXA reported 2015 normal. I do not have a copy of  this result but she does this to Dr. Silvestre Mesi office. Will continue to follow up with him in reference to this 6. Health maintenance. No routine blood work done as this is done at Dr. Silvestre Mesi office. Follow up 1 year, sooner as needed.   [name], 11:51 AM 12/06/2014

## 2014-12-06 NOTE — Patient Instructions (Signed)

## 2014-12-11 ENCOUNTER — Ambulatory Visit
Admission: RE | Admit: 2014-12-11 | Discharge: 2014-12-11 | Disposition: A | Discharge status: Home / Self Care | Payer: Medicare Other | Source: Ambulatory Visit | Attending: Gynecology | Admitting: Gynecology

## 2014-12-11 DIAGNOSIS — C50912 Malignant neoplasm of unspecified site of left female breast: Secondary | ICD-10-CM

## 2015-01-23 ENCOUNTER — Encounter: Payer: Self-pay | Admitting: Neurology

## 2015-01-23 ENCOUNTER — Ambulatory Visit (INDEPENDENT_AMBULATORY_CARE_PROVIDER_SITE_OTHER): Payer: Medicare Other | Admitting: Neurology

## 2015-01-23 VITALS — BP 150/62 | HR 86 | Resp 20 | Ht 65.0 in | Wt 254.0 lb

## 2015-01-23 DIAGNOSIS — G4733 Obstructive sleep apnea (adult) (pediatric): Secondary | ICD-10-CM | POA: Diagnosis not present

## 2015-01-23 DIAGNOSIS — M170 Bilateral primary osteoarthritis of knee: Secondary | ICD-10-CM

## 2015-01-23 DIAGNOSIS — Z9989 Dependence on other enabling machines and devices: Principal | ICD-10-CM

## 2015-01-23 NOTE — Progress Notes (Signed)
[facility]  Provider:  Larey Seat, M D  Referring Provider: Crist Infante, MD Primary Care Physician:  Jerlyn Ly, MD  Chief Complaint  Patient presents with  . Follow-up    cpap, PT DID NOT BRING CPAP MACHINE EVEN THOUGH SHE WAS REMINDED, will check with [facility] and find it, rm 10, alone    HPI:  Vicki [name] is a 80 y.o. female  Is seen here as a revisit from Dr. Joylene Draft for follow up on Sleep Apnea.  Vicki [name] , a right handed , married lady from [place] is retired. She was  diagnosed with obstructive sleep apnea , the sleep study took place in 2005.  She is seen today on 01-23-15 and recently had a download of her CPAP machine through her DME, [facility]. Dated 12-10-14 she was 100% compliance for 90 days of use and 100% of those days for over 4 hours of continued use. Her average usage was 7 hours and 42 minutes. Set pressure remains at 12 cm water pressure and her EPR is set at 2 cm water the residual AHI is 0.6. She does have moderate to severe air leaks at times. Vicki [name] reports that her sleep has been impaired not by apnea but by knee pain and other arthritis and degenerative joint disease related discomfort. She endorsed today the Epworth sleepiness score at 9 points and the fatigue severity score at 45 points. She does not feel depressed her geriatric depression score is endorsed at 2 out of 15 points her sleep habits has not overall changed but the sleep quality has changed with the pain. She usually goes to bed around 11 PM she falls asleep rather promptly she states but wakes up frequently through the night she also has trouble finding a comfortable sleep position. She wakes up about 3 times at night goes to the bathroom twice on average. She does fall asleep again she rises in the morning at about 6 AM -she has never used an alarm and her inner clock is well trained ! She rearely naps in daytime, just sitting will hurt  her back,  She drinks decaf the native coffee in the morning, and she may have to iced teas a week and disorder a week. Her caffeine intake is truly low. She is a nonsmoker lifelong and a nondrinker. She has no shift work history.   2015 note :  The patient has been using CPAP at 12 cm water setting, she is using it nightly he has a 30 day download available here with 100% compliance. Residual AHI of 0.7. The 12 cm water setting is unchanged in previous years. Nightly time and therapy is 8 hours and 32 minutes.  Epworth sleepiness score is endorsed at 4 points, or fatigue severity score however which is 44 points and elevated. Review of her medicine shows that she is still taking Vicodin for shoulder pain as needed.  She has sciatica and takes prednisone .  She is still on Allopurinol for the treatment of gout and of Norvasc for blood pressure control she's also on Coreg, Femara , and Crestor. Dr. Joylene Draft reduced her Crestor from 20 to 10 mg after she developed myalgia. Now she has further reduced the dose to every other day.     Review of Systems: Out of a complete 14 system review, the patient complains of only the following symptoms, and all other reviewed systems are negative. The patient had recently surgery does a  rotator cuff right and left him a interradial systems she and her was joint pain ongoing sometimes feeling hot ,  having leg swelling she also has insomnia, and her skin has developed some moles.  She continues to gain weight.  She is retired, and she is followed by Dr. Jana Hakim for her breast cancer.  Dr Trudie Reed , Rheumatology. Dr. Oswald Hillock. and  Dr. Joylene Draft , PCP      Social History   Social History  . Marital Status: Married    Spouse Name: N/A  . Number of Children: N/A  . Years of Education: N/A   Occupational History  . Not on file.   Social History Main Topics  . Smoking status: Former Smoker    Quit date: 04/06/1961  . Smokeless tobacco: Never Used  .  Alcohol Use: No  . Drug Use: No  . Sexual Activity: No     Comment: 1st intercourse 30 yo-5 partners   Other Topics Concern  . Not on file   Social History Narrative   Right handed, Married, 6 kids, Caffeine 1 cup daily, Retired,  HS grad,     Family History  Problem Relation Age of Onset  . Diabetes Mother   . Breast cancer Sister     Age unknown  . Diabetes Sister   . Diabetes Brother   . Breast cancer Maternal Grandmother     Age unknown    Past Medical History  Diagnosis Date  . Bradycardia   . Chest pain   . Obesity   . Asthma     none in years  . Sleep apnea     states setting on 11- followed by Dr Westley Hummer yearly- last study 7 years ago  . GERD (gastroesophageal reflux disease)   . Arthritis     osteo  . Gout   . Chronic kidney disease     states elevated creatnine- followed by Dr Joylene Draft  . Breast cancer ([place]) 2010    Left  . Complication of anesthesia     following tympanoplasty in 1997- "HAD TIGHTENING AROUND CHEST FOLLOWING ANESTHESIA'- states has done well x 2 since  . Hypertension   . Diabetes mellitus     borderline/diet controlled  . Sleep apnea with use of continuous positive airway pressure (CPAP) 01/24/2013  . Scoliosis     Past Surgical History  Procedure Laterality Date  . Breast lumpectomy  07/2008    Left - Dr Margot Chimes  . Tonsillectomy    . Tympanoplasty  1997    right  . Carpal tunnel release      right  . Shoulder open rotator cuff repair  05/27/2011    Procedure: ROTATOR CUFF REPAIR SHOULDER OPEN;  Surgeon: Tobi Bastos, MD;  Location: [facility];  Service: Orthopedics;  Laterality: Right;  Right Shoulder Rotator Cuff Repair complex    . Dilation and curettage of uterus      Current Outpatient Prescriptions  Medication Sig Dispense Refill  . allopurinol (ZYLOPRIM) 300 MG tablet Take 150 mg by mouth daily.    Marland Kitchen amLODipine (NORVASC) 2.5 MG tablet Take 2.5 mg by mouth daily. States takes 1100 AM    . aspirin 81 MG tablet Take 81 mg  by mouth every other day.     . Calcium Carbonate-Vitamin D (CALCIUM + D PO) Take 600 Units by mouth daily.     . carvedilol (COREG) 12.5 MG tablet TAKE 1 TABLET TWICE DAILY WITH A MEAL. 60 tablet 11  .  Cholecalciferol (VITAMIN D) 1000 UNITS capsule Take 1,000 Units by mouth daily.     . Coenzyme Q10 (CO Q 10 PO) Take 1 tablet by mouth daily.     Marland Kitchen EPIPEN 2-PAK 0.3 MG/0.3ML DEVI     . HYDROcodone-acetaminophen (NORCO) 5-325 MG per tablet Take 1 tablet by mouth every 8 (eight) hours as needed.    Marland Kitchen losartan (COZAAR) 100 MG tablet Take 100 mg by mouth at bedtime.    . Multiple Vitamin (MULTIVITAMIN) tablet Take 1 tablet by mouth daily.     . rosuvastatin (CRESTOR) 20 MG tablet Take 10 mg by mouth every evening.      No current facility-administered medications for this visit.    Allergies as of 01/23/2015 - Review Complete 01/23/2015  Allergen Reaction Noted  . Shellfish allergy Swelling 10/04/2012  . Pantoprazole sodium Palpitations 07/23/2010  . Sulfur Swelling and Rash 07/23/2010    Vitals: BP 150/62 mmHg  Pulse 86  Resp 20  Ht 5\' 5"  (1.651 m)  Wt 254 lb (115.214 kg)  BMI 42.27 kg/m2 Last Weight:  Wt Readings from Last 1 Encounters:  01/23/15 254 lb (115.214 kg)   Last Height:   Ht Readings from Last 1 Encounters:  01/23/15 5\' 5"  (1.651 m)    Physical exam:  General: The patient is awake, alert and appears not in acute distress. The patient is well groomed. Head: Normocephalic, atraumatic. Neck is supple. Mallampati 3  neck circumference: 17.00. Inches  Cardiovascular:  Regular rate and rhythm, without  murmurs or carotid bruit, and without distended neck veins. Respiratory: Lungs are clear to auscultation. Skin: Evidence of edema,  Puffy feet, ankles and  hands , no rash Trunk: BMI is elevated , she has normal posture.  Neurologic exam : The patient is awake and alert, oriented to place and time.   Memory subjective described as intact. There is a normal attention  span & concentration ability. Speech is fluent without dysarthria, dysphonia or aphasia. Mood and affect are appropriate. Cranial nerves: Pupils are equal and briskly reactive to light. Extraocular movements  in vertical and horizontal planes intact and without nystagmus.  Hearing to finger rub intact. Facial sensation intact to fine touch. Facial motor strength is symmetric and tongue and uvula move midline. Motor : Unable to raise either arm above shoulder level. Restricted shoulder range of motion. Decreased grip strength. Attenuated reflexes of the ankles, patella.  Coordination: Rapid alternating movements in the fingers/hands is tested and normal.  Gait and station: Patient walks without assistive device. Stance is wide based.Tandem gait is impossible . She has pain in both legs , a deep achy and sore feeling that only gradually improved after Crestor reduction. Romberg testing deferred ,  Deep tendon reflexes: in the  upper and lower extremities are attenuated, symmetric. Babinski maneuver response is downgoing.  Assessment:  After physical and neurologic examination, review of laboratory studies, imaging, neurophysiology testing and pre-existing records, assessment is   that of OSA, 1) OSA on CPAP - OSA well controlled on current settings, and with reduced EDS. Highly compliant patient , 100 % on CPAP 12 cm water.  2) Morbidly obese High BMI , still gaining weight.  Fostering DM,  HTN and CKD .  3) Blood sugar control is poor. Hematuria reported, labs from Dr Joylene Draft, Gout. Also decreased renal clearance. Cannot take NSAIDS.   4) Menopausal hot flushes much less frequent after Femara ( therapy over 5 years, concluded 2014) . 5) NEW significant join pain, interruption  of sleep due to pain and nocturia.    Plan: Treatment plan and additional workup :  Weight loss is urgently  needed. Patient is doing well with CPAP in a reclined position.  Not longer snoring. Sleeping not as well due to  joint pain . She is unable to tolerate supine sleep - due to BMI  Continue CPAP. Continue pain management with Dr. Nelva Bush.  Is this patient a candidate for weight loss surgery?    Dr. Ubaldo Glassing had suggested this to her. She will discuss this with Dr. Joylene Draft.   20 minute RV with over 50% of the face to face time dedicated to improving sleep hygiene, and weight loss , all questions answered.     Cc Dr. Joylene Draft

## 2015-02-03 ENCOUNTER — Other Ambulatory Visit: Payer: Self-pay | Admitting: Cardiovascular Disease

## 2015-05-29 ENCOUNTER — Emergency Department (HOSPITAL_BASED_OUTPATIENT_CLINIC_OR_DEPARTMENT_OTHER): Payer: Medicare Other

## 2015-05-29 ENCOUNTER — Encounter (HOSPITAL_BASED_OUTPATIENT_CLINIC_OR_DEPARTMENT_OTHER): Payer: Self-pay

## 2015-05-29 ENCOUNTER — Emergency Department (HOSPITAL_BASED_OUTPATIENT_CLINIC_OR_DEPARTMENT_OTHER)
Admission: EM | Admit: 2015-05-29 | Discharge: 2015-05-30 | Disposition: A | Discharge status: Home / Self Care | Payer: Medicare Other | Attending: Emergency Medicine | Admitting: Emergency Medicine

## 2015-05-29 DIAGNOSIS — Z7982 Long term (current) use of aspirin: Secondary | ICD-10-CM | POA: Insufficient documentation

## 2015-05-29 DIAGNOSIS — I129 Hypertensive chronic kidney disease with stage 1 through stage 4 chronic kidney disease, or unspecified chronic kidney disease: Secondary | ICD-10-CM | POA: Diagnosis not present

## 2015-05-29 DIAGNOSIS — Z87891 Personal history of nicotine dependence: Secondary | ICD-10-CM | POA: Diagnosis not present

## 2015-05-29 DIAGNOSIS — S8002XA Contusion of left knee, initial encounter: Secondary | ICD-10-CM

## 2015-05-29 DIAGNOSIS — N189 Chronic kidney disease, unspecified: Secondary | ICD-10-CM | POA: Insufficient documentation

## 2015-05-29 DIAGNOSIS — S0990XA Unspecified injury of head, initial encounter: Secondary | ICD-10-CM | POA: Diagnosis not present

## 2015-05-29 DIAGNOSIS — S3991XA Unspecified injury of abdomen, initial encounter: Secondary | ICD-10-CM | POA: Diagnosis not present

## 2015-05-29 DIAGNOSIS — K219 Gastro-esophageal reflux disease without esophagitis: Secondary | ICD-10-CM | POA: Diagnosis not present

## 2015-05-29 DIAGNOSIS — Z9981 Dependence on supplemental oxygen: Secondary | ICD-10-CM | POA: Diagnosis not present

## 2015-05-29 DIAGNOSIS — Y998 Other external cause status: Secondary | ICD-10-CM | POA: Insufficient documentation

## 2015-05-29 DIAGNOSIS — S3992XA Unspecified injury of lower back, initial encounter: Secondary | ICD-10-CM | POA: Insufficient documentation

## 2015-05-29 DIAGNOSIS — M1990 Unspecified osteoarthritis, unspecified site: Secondary | ICD-10-CM | POA: Insufficient documentation

## 2015-05-29 DIAGNOSIS — Y92410 Unspecified street and highway as the place of occurrence of the external cause: Secondary | ICD-10-CM | POA: Insufficient documentation

## 2015-05-29 DIAGNOSIS — G4733 Obstructive sleep apnea (adult) (pediatric): Secondary | ICD-10-CM | POA: Diagnosis not present

## 2015-05-29 DIAGNOSIS — S8991XA Unspecified injury of right lower leg, initial encounter: Secondary | ICD-10-CM | POA: Diagnosis present

## 2015-05-29 DIAGNOSIS — J45909 Unspecified asthma, uncomplicated: Secondary | ICD-10-CM | POA: Insufficient documentation

## 2015-05-29 DIAGNOSIS — M109 Gout, unspecified: Secondary | ICD-10-CM | POA: Diagnosis not present

## 2015-05-29 DIAGNOSIS — Z79899 Other long term (current) drug therapy: Secondary | ICD-10-CM | POA: Diagnosis not present

## 2015-05-29 DIAGNOSIS — Y9389 Activity, other specified: Secondary | ICD-10-CM | POA: Diagnosis not present

## 2015-05-29 DIAGNOSIS — S2001XA Contusion of right breast, initial encounter: Secondary | ICD-10-CM

## 2015-05-29 DIAGNOSIS — E119 Type 2 diabetes mellitus without complications: Secondary | ICD-10-CM | POA: Insufficient documentation

## 2015-05-29 DIAGNOSIS — Z853 Personal history of malignant neoplasm of breast: Secondary | ICD-10-CM | POA: Diagnosis not present

## 2015-05-29 DIAGNOSIS — S8001XA Contusion of right knee, initial encounter: Secondary | ICD-10-CM | POA: Diagnosis not present

## 2015-05-29 DIAGNOSIS — E669 Obesity, unspecified: Secondary | ICD-10-CM | POA: Diagnosis not present

## 2015-05-29 DIAGNOSIS — R1013 Epigastric pain: Secondary | ICD-10-CM

## 2015-05-29 MED ORDER — SODIUM CHLORIDE 0.9 % IV SOLN: INTRAVENOUS | Status: DC

## 2015-05-29 MED ORDER — SODIUM CHLORIDE 0.9 % IV BOLUS (SEPSIS): 250.0000 mL | Freq: Once | INTRAVENOUS | Status: DC

## 2015-05-29 MED ORDER — HYDROCODONE-ACETAMINOPHEN 5-325 MG PO TABS: 1.0000 | ORAL_TABLET | Freq: Four times a day (QID) | ORAL | Status: AC | PRN

## 2015-05-29 MED ORDER — HYDROCODONE-ACETAMINOPHEN 5-325 MG PO TABS
1.0000 | ORAL_TABLET | Freq: Once | ORAL | Status: AC
  Administered 2015-05-29: 1 via ORAL
  Filled 2015-05-29: qty 1

## 2015-05-29 MED ORDER — ONDANSETRON HCL 4 MG/2ML IJ SOLN: 4.0000 mg | Freq: Once | INTRAMUSCULAR | Status: DC

## 2015-05-29 NOTE — Discharge Instructions (Signed)
Follow-up with your doctor in the next few days for reevaluation of the right breast hematoma. Expect to be sore and stiff over the next several days. Take the hydrocodone as needed for pain. Return for any new or worse symptoms. CT scan of your chest and abdomen and pelvis without any significant findings.
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
## 2015-05-30 ENCOUNTER — Emergency Department (HOSPITAL_BASED_OUTPATIENT_CLINIC_OR_DEPARTMENT_OTHER): Payer: Medicare Other

## 2015-05-30 LAB — BASIC METABOLIC PANEL
Anion gap: 9 (ref 5–15)
BUN: 22 mg/dL — AB (ref 6–20)
CHLORIDE: 106 mmol/L (ref 101–111)
CO2: 24 mmol/L (ref 22–32)
Calcium: 9 mg/dL (ref 8.9–10.3)
Creatinine, Ser: 1.37 mg/dL — ABNORMAL HIGH (ref 0.44–1.00)
GFR calc Af Amer: 42 mL/min — ABNORMAL LOW (ref >60)
GFR calc non Af Amer: 36 mL/min — ABNORMAL LOW (ref >60)
Glucose, Bld: 116 mg/dL — ABNORMAL HIGH (ref 65–99)
POTASSIUM: 4 mmol/L (ref 3.5–5.1)
Sodium: 139 mmol/L (ref 135–145)

## 2015-05-30 LAB — CBC
HEMATOCRIT: 37.2 % (ref 36.0–46.0)
HEMOGLOBIN: 12.2 g/dL (ref 12.0–15.0)
MCH: 27.5 pg (ref 26.0–34.0)
MCHC: 32.8 g/dL (ref 30.0–36.0)
MCV: 83.8 fL (ref 78.0–100.0)
Platelets: 225 10*3/uL (ref 150–400)
RBC: 4.44 MIL/uL (ref 3.87–5.11)
RDW: 15.7 % — AB (ref 11.5–15.5)
WBC: 11.6 10*3/uL — ABNORMAL HIGH (ref 4.0–10.5)

## 2015-05-30 MED ORDER — IOHEXOL 300 MG/ML  SOLN
100.0000 mL | Freq: Once | INTRAMUSCULAR | Status: AC | PRN
  Administered 2015-05-30: 100 mL via INTRAVENOUS

## 2015-05-30 MED ORDER — HYDROCODONE-ACETAMINOPHEN 5-325 MG PO TABS
1.0000 | ORAL_TABLET | Freq: Once | ORAL | Status: AC
  Administered 2015-05-30: 1 via ORAL
  Filled 2015-05-30: qty 1

## 2015-05-30 MED ORDER — CYCLOBENZAPRINE HCL 10 MG PO TABS
10.0000 mg | ORAL_TABLET | Freq: Once | ORAL | Status: AC
  Administered 2015-05-30: 10 mg via ORAL
  Filled 2015-05-30: qty 1

## 2015-05-30 NOTE — ED Notes (Signed)
MD at bedside discussing test results and dispo plan of care. 
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
## 2015-05-31 ENCOUNTER — Emergency Department (HOSPITAL_COMMUNITY)
Admission: EM | Admit: 2015-05-31 | Discharge: 2015-05-31 | Disposition: A | Discharge status: Home / Self Care | Payer: Medicare Other | Source: Home / Self Care

## 2015-05-31 ENCOUNTER — Encounter (HOSPITAL_COMMUNITY): Payer: Self-pay | Admitting: Emergency Medicine

## 2015-05-31 DIAGNOSIS — M791 Myalgia: Secondary | ICD-10-CM

## 2015-05-31 DIAGNOSIS — M7918 Myalgia, other site: Secondary | ICD-10-CM

## 2015-05-31 DIAGNOSIS — R58 Hemorrhage, not elsewhere classified: Secondary | ICD-10-CM

## 2015-05-31 MED ORDER — METHOCARBAMOL 500 MG PO TABS: 500.0000 mg | ORAL_TABLET | Freq: Four times a day (QID) | ORAL | Status: DC | PRN
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
## 2015-05-31 NOTE — Discharge Instructions (Signed)
Stay as active as possible Apply heat to the bruises Use the robaxin for additional muscle pains Your pain will slowly subside over the next several days

## 2015-06-04 ENCOUNTER — Encounter: Payer: Self-pay | Admitting: Cardiovascular Disease

## 2015-06-04 ENCOUNTER — Ambulatory Visit (INDEPENDENT_AMBULATORY_CARE_PROVIDER_SITE_OTHER): Payer: Medicare Other | Admitting: Cardiovascular Disease

## 2015-06-04 VITALS — BP 122/70 | HR 66 | Ht 65.0 in | Wt 265.6 lb

## 2015-06-04 DIAGNOSIS — I10 Essential (primary) hypertension: Secondary | ICD-10-CM

## 2015-06-04 NOTE — Patient Instructions (Signed)
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
## 2015-07-06 ENCOUNTER — Encounter (HOSPITAL_COMMUNITY): Payer: Self-pay | Admitting: Emergency Medicine

## 2015-07-06 ENCOUNTER — Emergency Department (INDEPENDENT_AMBULATORY_CARE_PROVIDER_SITE_OTHER)
Admission: EM | Admit: 2015-07-06 | Discharge: 2015-07-06 | Disposition: A | Discharge status: Home / Self Care | Payer: Self-pay | Source: Home / Self Care | Attending: Emergency Medicine | Admitting: Emergency Medicine

## 2015-07-06 DIAGNOSIS — N6315 Unspecified lump in the right breast, overlapping quadrants: Secondary | ICD-10-CM

## 2015-07-06 DIAGNOSIS — N63 Unspecified lump in breast: Secondary | ICD-10-CM

## 2015-07-06 DIAGNOSIS — N6310 Unspecified lump in the right breast, unspecified quadrant: Principal | ICD-10-CM

## 2015-07-06 NOTE — Discharge Instructions (Signed)
The lump in your breast maybe a bruise that is healing.  It is best that you follow up with your PCP as you may need an ultrasound or mammogram   You can also apply warm compresses to your breast.  There is no signs of hernia or abdominal contusions at this time.
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
## 2015-11-01 ENCOUNTER — Other Ambulatory Visit: Payer: Self-pay | Admitting: Gynecology

## 2015-11-01 DIAGNOSIS — Z9889 Other specified postprocedural states: Secondary | ICD-10-CM

## 2015-11-01 DIAGNOSIS — Z853 Personal history of malignant neoplasm of breast: Secondary | ICD-10-CM

## 2015-12-18 ENCOUNTER — Ambulatory Visit
Admission: RE | Admit: 2015-12-18 | Discharge: 2015-12-18 | Disposition: A | Discharge status: Home / Self Care | Payer: Medicare Other | Source: Ambulatory Visit | Attending: Gynecology | Admitting: Gynecology

## 2015-12-18 ENCOUNTER — Other Ambulatory Visit: Payer: Self-pay | Admitting: Gynecology

## 2015-12-18 DIAGNOSIS — Z853 Personal history of malignant neoplasm of breast: Secondary | ICD-10-CM

## 2015-12-18 DIAGNOSIS — Z9889 Other specified postprocedural states: Secondary | ICD-10-CM

## 2015-12-18 DIAGNOSIS — N6310 Unspecified lump in the right breast, unspecified quadrant: Secondary | ICD-10-CM

## 2016-01-01 DIAGNOSIS — J309 Allergic rhinitis, unspecified: Secondary | ICD-10-CM | POA: Insufficient documentation

## 2016-01-02 ENCOUNTER — Encounter: Payer: Self-pay | Admitting: Gynecology

## 2016-01-02 ENCOUNTER — Ambulatory Visit (INDEPENDENT_AMBULATORY_CARE_PROVIDER_SITE_OTHER): Payer: Medicare Other | Admitting: Gynecology

## 2016-01-02 VITALS — BP 136/84 | Ht 64.0 in | Wt 265.0 lb

## 2016-01-02 DIAGNOSIS — Z01419 Encounter for gynecological examination (general) (routine) without abnormal findings: Secondary | ICD-10-CM

## 2016-01-02 DIAGNOSIS — N952 Postmenopausal atrophic vaginitis: Secondary | ICD-10-CM | POA: Diagnosis not present

## 2016-01-02 DIAGNOSIS — C50912 Malignant neoplasm of unspecified site of left female breast: Secondary | ICD-10-CM | POA: Diagnosis not present

## 2016-01-02 NOTE — Patient Instructions (Signed)

## 2016-01-02 NOTE — Progress Notes (Signed)
    Vicki [name] March 31, 1935 [id number]        81 y.o.  [id number]  for annual exam.  Doing well without gynecologic complaints  Past medical history,surgical history, problem list, medications, allergies, family history and social history were all reviewed and documented as reviewed in the EPIC chart.  ROS:  Performed with pertinent positives and negatives included in the history, assessment and plan.   Additional significant findings :  None   Exam: Caryn Bee assistant Vitals:   01/02/16 1027  BP: 136/84  Weight: 265 lb (120.2 kg)  Height: 5\' 4"  (1.626 m)   Body mass index is 45.49 kg/m.  General appearance:  Normal affect, orientation and appearance. Skin: Grossly normal HEENT: Without gross lesions.  No cervical or supraclavicular adenopathy. Thyroid normal.  Lungs:  Clear without wheezing, rales or rhonchi Cardiac: RR, without RMG Abdominal:  Soft, nontender, without masses, guarding, rebound, organomegaly or hernia Breasts:  Examined lying and sitting without masses, retractions, discharge or axillary adenopathy. Pelvic:  Ext, BUS, Vagina normal  Cervix normal  Uterus anteverted, normal size, shape and contour, midline and mobile nontender   Adnexa without masses or tenderness    Anus and perineum normal   Rectovaginal normal sphincter tone without palpated masses or tenderness.    Assessment/Plan:  81 y.o. [id number] female for annual exam.   1. Postmenopausal/atrophic genital changes. No significant hot flushes, night sweats, vaginal dryness or any vaginal bleeding. Continue to monitor and report any bleeding or issues. 2. History of left breast cancer status post lumpectomy. Exam NED. Mammography 12/2015. SBE reviewed. 3. DEXA 2015 reported normal. I do not have a copy of this but she follows up with Dr. Joylene Draft in reference to this. 4. Colonoscopy 2015. Repeat at their recommended interval. 5. Pap smear 2013. No Pap smear done today. No history of abnormal Pap  smears. Per current screening guidelines we both agree to stop screening based on age. 6. Health maintenance. No routine lab work done as this is done elsewhere. Follow up 1 year, sooner as needed.   [name], 11:07 AM 01/02/2016

## 2016-01-15 ENCOUNTER — Ambulatory Visit: Payer: Medicare Other

## 2016-01-20 ENCOUNTER — Telehealth: Payer: Self-pay | Admitting: Neurology

## 2016-01-20 NOTE — Telephone Encounter (Signed)
I spoke to pt and advised her that her cpap chip could be brought to [facility] for her appt with Jinny Blossom, NP. Pt verbalized understanding.

## 2016-01-20 NOTE — Telephone Encounter (Signed)
Pt is calling wanting to know if she needs to take the chip to [facility] or can it be ready at the clinic when she comes 10/19 with [facility]

## 2016-01-23 ENCOUNTER — Ambulatory Visit: Payer: Medicare Other | Admitting: Adult Health

## 2016-01-27 ENCOUNTER — Encounter: Payer: Self-pay | Admitting: Neurology

## 2016-01-28 ENCOUNTER — Encounter: Payer: Self-pay | Admitting: Adult Health

## 2016-01-28 ENCOUNTER — Ambulatory Visit (INDEPENDENT_AMBULATORY_CARE_PROVIDER_SITE_OTHER): Payer: Medicare Other | Admitting: Adult Health

## 2016-01-28 VITALS — BP 137/54 | HR 56 | Ht 64.0 in | Wt 257.6 lb

## 2016-01-28 DIAGNOSIS — Z9989 Dependence on other enabling machines and devices: Secondary | ICD-10-CM | POA: Diagnosis not present

## 2016-01-28 DIAGNOSIS — G4733 Obstructive sleep apnea (adult) (pediatric): Secondary | ICD-10-CM

## 2016-01-28 NOTE — Progress Notes (Signed)
PATIENT: Vicki [name] DOB: 06-02-1934  REASON FOR VISIT: follow up-obstructive sleep apnea on CPAP HISTORY FROM: patient  HISTORY OF PRESENT ILLNESS: Today 01/28/2016: Vicki [name] is a 81 year old female with a history of obstructive sleep apnea on CPAP. She returns today for follow-up. Her compliance download indicates that she uses her machine 30 out of 30 days for compliance of 100%. She uses her machine greater than 4 hours 29 out of 30 days for compliance of 97%. She has a residual AHI is 0.5 on 12 cm water with EPR 2. She does not have a significant leak. She feels that the CPAP is working well for her. Her Epworth sleepiness score is 10 and fatigue severity score is 49. She contributes the elevation in her scores to her pain. She states that she's had 2 rotator cuff surgeries, osteoarthritis and was recently diagnosis with some "back issues." She states that surgery was recommended however she declined and is just in pain management at [facility]. She returns today for an evaluation.  HISTORY copied from Dr. Edwena Felty notes: Vicki [name] , a right handed , married lady from [employer] is retired. She was  diagnosed with obstructive sleep apnea , the sleep study took place in 2005.  She is seen today on 01-23-15 and recently had a download of her CPAP machine through her DME, [employer]. Dated 12-10-14 she was 100% compliance for 90 days of use and 100% of those days for over 4 hours of continued use. Her average usage was 7 hours and 42 minutes. Set pressure remains at 12 cm water pressure and her EPR is set at 2 cm water the residual AHI is 0.6. She does have moderate to severe air leaks at times. Vicki [name] reports that her sleep has been impaired not by apnea but by knee pain and other arthritis and degenerative joint disease related discomfort. She endorsed today the Epworth sleepiness score at 9 points and the fatigue severity score at 45 points. She does not feel  depressed her geriatric depression score is endorsed at 2 out of 15 points her sleep habits has not overall changed but the sleep quality has changed with the pain. She usually goes to bed around 11 PM she falls asleep rather promptly she states but wakes up frequently through the night she also has trouble finding a comfortable sleep position. She wakes up about 3 times at night goes to the bathroom twice on average. She does fall asleep again she rises in the morning at about 6 AM -she has never used an alarm and her inner clock is well trained ! She rearely naps in daytime, just sitting will hurt her back,  She drinks decaf the native coffee in the morning, and she may have to iced teas a week and disorder a week. Her caffeine intake is truly low. She is a nonsmoker lifelong and a nondrinker. She has no shift work history.      REVIEW OF SYSTEMS: Out of a complete 14 system review of symptoms, the patient complains only of the following symptoms, and all other reviewed systems are negative.  Blood and urine joint pain, back pain, aching muscles, neck pain, neck stiffness, moles, weakness, food allergies, apnea, frequent waking, leg swelling  ALLERGIES: Allergies  Allergen Reactions  . Shellfish Allergy Swelling  . Pantoprazole Sodium Palpitations  . Sulfur Swelling and Rash    HOME MEDICATIONS: Outpatient Medications Prior to Visit  Medication Sig Dispense Refill  . allopurinol (  ZYLOPRIM) 300 MG tablet Take 150 mg by mouth daily.    Marland Kitchen amLODipine (NORVASC) 2.5 MG tablet Take 2.5 mg by mouth daily. States takes 1100 AM    . aspirin 81 MG tablet Take 81 mg by mouth every other day.     . Calcium Carbonate-Vitamin D (CALCIUM + D PO) Take 600 Units by mouth daily.     . carvedilol (COREG) 12.5 MG tablet TAKE 1 TABLET TWICE DAILY WITH A MEAL. 60 tablet 11  . Cholecalciferol (VITAMIN D) 1000 UNITS capsule Take 1,000 Units by mouth daily.     . Coenzyme Q10 (CO Q 10 PO) Take 1 tablet by  mouth daily.     Marland Kitchen EPIPEN 2-PAK 0.3 MG/0.3ML DEVI     . HYDROcodone-acetaminophen (NORCO/VICODIN) 5-325 MG tablet Take 1-2 tablets by mouth every 6 (six) hours as needed for moderate pain. 10 tablet 0  . losartan (COZAAR) 100 MG tablet Take 100 mg by mouth at bedtime.    . methocarbamol (ROBAXIN) 500 MG tablet Take 1-2 tablets (500-1,000 mg total) by mouth every 6 (six) hours as needed for muscle spasms. 60 tablet 0  . Multiple Vitamin (MULTIVITAMIN) tablet Take 1 tablet by mouth daily.     . rosuvastatin (CRESTOR) 20 MG tablet Take 10 mg by mouth every evening.      No facility-administered medications prior to visit.     PAST MEDICAL HISTORY: Past Medical History:  Diagnosis Date  . Arthritis    osteo  . Asthma    none in years  . Bradycardia   . Breast cancer ([place]) 2010   Left  . Chest pain   . Chronic kidney disease    states elevated creatnine- followed by Dr Joylene Draft  . Complication of anesthesia    following tympanoplasty in 1997- "HAD TIGHTENING AROUND CHEST FOLLOWING ANESTHESIA'- states has done well x 2 since  . Diabetes mellitus    borderline/diet controlled  . GERD (gastroesophageal reflux disease)   . Gout   . Hypertension   . Obesity   . Scoliosis   . Sleep apnea    states setting on 11- followed by Dr Westley Hummer yearly- last study 7 years ago  . Sleep apnea with use of continuous positive airway pressure (CPAP) 01/24/2013    PAST SURGICAL HISTORY: Past Surgical History:  Procedure Laterality Date  . BREAST LUMPECTOMY  07/2008   Left - Dr Margot Chimes  . CARPAL TUNNEL RELEASE     right  . DILATION AND CURETTAGE OF UTERUS    . SHOULDER OPEN ROTATOR CUFF REPAIR  05/27/2011   Procedure: ROTATOR CUFF REPAIR SHOULDER OPEN;  Surgeon: Tobi Bastos, MD;  Location: [facility];  Service: Orthopedics;  Laterality: Right;  Right Shoulder Rotator Cuff Repair complex    . TONSILLECTOMY    . TYMPANOPLASTY  1997   right    FAMILY HISTORY: Family History  Problem Relation  Age of Onset  . Diabetes Mother   . Breast cancer Sister     Age unknown  . Diabetes Sister   . Diabetes Brother   . Breast cancer Maternal Grandmother     Age unknown    SOCIAL HISTORY: Social History   Social History  . Marital status: Married    Spouse name: N/A  . Number of children: N/A  . Years of education: N/A   Occupational History  . Not on file.   Social History Main Topics  . Smoking status: Former Smoker    Quit date:  04/06/1961  . Smokeless tobacco: Never Used  . Alcohol use No  . Drug use: No  . Sexual activity: Yes    Birth control/ protection: Post-menopausal     Comment: 1st intercourse 17 yo-5 partners   Other Topics Concern  . Not on file   Social History Narrative   Right handed, Married, 6 kids, Caffeine 1 cup daily, Retired,  [employer] grad,       PHYSICAL EXAM  Vitals:   01/28/16 1348  BP: (!) 137/54  Pulse: (!) 56  Weight: 257 lb 9.6 oz (116.8 kg)  Height: 5\' 4"  (1.626 m)   Body mass index is 44.22 kg/m.  Generalized: Well developed, in no acute distress  Neck: 17-1/2 inches- Circumference, Mallampati 3+  Neurological examination  Mentation: Alert oriented to time, place, history taking. Follows all commands speech and language fluent Cranial nerve II-XII: Pupils were equal round reactive to light. Extraocular movements were full, visual field were full on confrontational test. Facial sensation and strength were normal. Uvula tongue midline. Head turning and shoulder shrug  were normal and symmetric. Motor: The motor testing reveals 5 over 5 strength of all 4 extremities. Good symmetric motor tone is noted throughout.  Sensory: Sensory testing is intact to soft touch on all 4 extremities. No evidence of extinction is noted.  Coordination: Cerebellar testing reveals good finger-nose-finger and heel-to-shin bilaterally.  Gait and station: Gait is normal. Tandem gait is unsteady.. Romberg is negative. No drift is seen.  Reflexes: Deep tendon  reflexes are symmetric and normal bilaterally.   DIAGNOSTIC DATA (LABS, IMAGING, TESTING) - I reviewed patient records, labs, notes, testing and imaging myself where available.  Lab Results  Component Value Date   WBC 11.6 (H) 05/30/2015   HGB 12.2 05/30/2015   HCT 37.2 05/30/2015   MCV 83.8 05/30/2015   PLT 225 05/30/2015      Component Value Date/Time   NA 139 05/30/2015 0039   NA 140 07/06/2013 0900   K 4.0 05/30/2015 0039   K 3.9 07/06/2013 0900   CL 106 05/30/2015 0039   CL 106 07/05/2012 1101   CO2 24 05/30/2015 0039   CO2 20 (L) 07/06/2013 0900   GLUCOSE 116 (H) 05/30/2015 0039   GLUCOSE 170 (H) 07/06/2013 0900   GLUCOSE 182 (H) 07/05/2012 1101   BUN 22 (H) 05/30/2015 0039   BUN 21.2 07/06/2013 0900   CREATININE 1.37 (H) 05/30/2015 0039   CREATININE 1.4 (H) 07/06/2013 0900   CALCIUM 9.0 05/30/2015 0039   CALCIUM 9.4 07/06/2013 0900   PROT 7.3 07/06/2013 0900   ALBUMIN 3.6 07/06/2013 0900   AST 14 07/06/2013 0900   ALT 13 07/06/2013 0900   ALKPHOS 102 07/06/2013 0900   BILITOT 0.37 07/06/2013 0900   GFRNONAA 36 (L) 05/30/2015 0039   GFRAA 42 (L) 05/30/2015 0039      ASSESSMENT AND PLAN 81 y.o. year old female  has a past medical history of Arthritis; Asthma; Bradycardia; Breast cancer ([place]) (2010); Chest pain; Chronic kidney disease; Complication of anesthesia; Diabetes mellitus; GERD (gastroesophageal reflux disease); Gout; Hypertension; Obesity; Scoliosis; Sleep apnea; and Sleep apnea with use of continuous positive airway pressure (CPAP) (01/24/2013). here with:  1. Obstructive sleep apnea on CPAP  The patient's download shows excellent compliance with good treatment of her apnea. She is encouraged to continue using the CPAP nightly. Patient is asking about a new machine. I advised her to contact her DME company. If she does qualify for a new machine I will  provide her with a prescription. Patient verbalized understanding. She will follow-up in one year with  Dr. Mechele Claude, MSN, NP-C 01/28/2016, 2:33 PM [facility] [address], [place], [place] [address] [phone]

## 2016-01-28 NOTE — Patient Instructions (Signed)
Continue using CPAP nightly If your symptoms worsen or you develop new symptoms please let us know.   

## 2016-01-28 NOTE — Progress Notes (Signed)
I agree with the assessment and plan as directed by NP .The patient is known to me .   Miski Feldpausch, MD  

## 2016-02-11 ENCOUNTER — Other Ambulatory Visit: Payer: Self-pay | Admitting: Cardiovascular Disease

## 2016-05-08 ENCOUNTER — Other Ambulatory Visit: Payer: Self-pay | Admitting: Cardiovascular Disease

## 2016-05-20 ENCOUNTER — Other Ambulatory Visit: Payer: Self-pay | Admitting: Gynecology

## 2016-05-20 DIAGNOSIS — N63 Unspecified lump in unspecified breast: Secondary | ICD-10-CM

## 2016-06-07 ENCOUNTER — Other Ambulatory Visit: Payer: Self-pay | Admitting: Cardiovascular Disease

## 2016-06-12 ENCOUNTER — Ambulatory Visit
Admission: RE | Admit: 2016-06-12 | Discharge: 2016-06-12 | Disposition: A | Discharge status: Home / Self Care | Payer: Medicare Other | Source: Ambulatory Visit | Attending: Gynecology | Admitting: Gynecology

## 2016-06-12 ENCOUNTER — Other Ambulatory Visit: Payer: Self-pay | Admitting: Gynecology

## 2016-06-12 DIAGNOSIS — N63 Unspecified lump in unspecified breast: Secondary | ICD-10-CM

## 2016-06-23 ENCOUNTER — Other Ambulatory Visit: Payer: Self-pay | Admitting: Cardiovascular Disease

## 2016-06-29 ENCOUNTER — Encounter: Payer: Self-pay | Admitting: Cardiovascular Disease

## 2016-06-29 ENCOUNTER — Ambulatory Visit (INDEPENDENT_AMBULATORY_CARE_PROVIDER_SITE_OTHER): Payer: Medicare Other | Admitting: Cardiovascular Disease

## 2016-06-29 VITALS — BP 120/62 | HR 64 | Ht 64.0 in | Wt 263.8 lb

## 2016-06-29 DIAGNOSIS — I119 Hypertensive heart disease without heart failure: Secondary | ICD-10-CM | POA: Insufficient documentation

## 2016-06-29 NOTE — Patient Instructions (Signed)
Medication Instructions:  Your physician recommends that you continue on your current medications as directed. Please refer to the Current Medication list given to you today.   Labwork: None Ordered   Testing/Procedures: None Ordered   Follow-Up: Your physician wants you to follow-up in: 1 year with Dr. Nahser.  You will receive a reminder letter in the mail two months in advance. If you don't receive a letter, please call our office to schedule the follow-up appointment.   If you need a refill on your cardiac medications before your next appointment, please call your pharmacy.   Thank you for choosing [facility]! Jaelee Laughter, RN [phone]    
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
## 2016-07-17 ENCOUNTER — Other Ambulatory Visit: Payer: Self-pay | Admitting: Cardiovascular Disease

## 2016-08-26 ENCOUNTER — Encounter (HOSPITAL_COMMUNITY): Payer: Self-pay

## 2016-08-26 ENCOUNTER — Emergency Department (HOSPITAL_COMMUNITY): Payer: Medicare Other

## 2016-08-26 ENCOUNTER — Emergency Department (HOSPITAL_COMMUNITY)
Admission: EM | Admit: 2016-08-26 | Discharge: 2016-08-26 | Disposition: A | Discharge status: Home / Self Care | Payer: Medicare Other | Attending: Emergency Medicine | Admitting: Emergency Medicine

## 2016-08-26 DIAGNOSIS — K859 Acute pancreatitis without necrosis or infection, unspecified: Secondary | ICD-10-CM | POA: Insufficient documentation

## 2016-08-26 DIAGNOSIS — R0689 Other abnormalities of breathing: Secondary | ICD-10-CM | POA: Diagnosis not present

## 2016-08-26 DIAGNOSIS — J45909 Unspecified asthma, uncomplicated: Secondary | ICD-10-CM | POA: Diagnosis not present

## 2016-08-26 DIAGNOSIS — R1110 Vomiting, unspecified: Secondary | ICD-10-CM | POA: Diagnosis present

## 2016-08-26 DIAGNOSIS — Z853 Personal history of malignant neoplasm of breast: Secondary | ICD-10-CM | POA: Diagnosis not present

## 2016-08-26 DIAGNOSIS — Z7982 Long term (current) use of aspirin: Secondary | ICD-10-CM | POA: Insufficient documentation

## 2016-08-26 DIAGNOSIS — Z87891 Personal history of nicotine dependence: Secondary | ICD-10-CM | POA: Diagnosis not present

## 2016-08-26 DIAGNOSIS — E1122 Type 2 diabetes mellitus with diabetic chronic kidney disease: Secondary | ICD-10-CM | POA: Diagnosis not present

## 2016-08-26 DIAGNOSIS — I1310 Hypertensive heart and chronic kidney disease without heart failure, with stage 1 through stage 4 chronic kidney disease, or unspecified chronic kidney disease: Secondary | ICD-10-CM | POA: Insufficient documentation

## 2016-08-26 DIAGNOSIS — R42 Dizziness and giddiness: Secondary | ICD-10-CM | POA: Diagnosis not present

## 2016-08-26 DIAGNOSIS — R112 Nausea with vomiting, unspecified: Secondary | ICD-10-CM

## 2016-08-26 DIAGNOSIS — N189 Chronic kidney disease, unspecified: Secondary | ICD-10-CM | POA: Diagnosis not present

## 2016-08-26 DIAGNOSIS — Z79899 Other long term (current) drug therapy: Secondary | ICD-10-CM | POA: Insufficient documentation

## 2016-08-26 LAB — BASIC METABOLIC PANEL
ANION GAP: 10 (ref 5–15)
BUN: 26 mg/dL — AB (ref 6–20)
CHLORIDE: 106 mmol/L (ref 101–111)
CO2: 24 mmol/L (ref 22–32)
Calcium: 8.9 mg/dL (ref 8.9–10.3)
Creatinine, Ser: 1.57 mg/dL — ABNORMAL HIGH (ref 0.44–1.00)
GFR calc Af Amer: 35 mL/min — ABNORMAL LOW (ref >60)
GFR, EST NON AFRICAN AMERICAN: 30 mL/min — AB (ref >60)
GLUCOSE: 111 mg/dL — AB (ref 65–99)
POTASSIUM: 4 mmol/L (ref 3.5–5.1)
Sodium: 140 mmol/L (ref 135–145)

## 2016-08-26 LAB — I-STAT CG4 LACTIC ACID, ED: LACTIC ACID, VENOUS: 1.33 mmol/L (ref 0.5–1.9)

## 2016-08-26 LAB — URINALYSIS, ROUTINE W REFLEX MICROSCOPIC
Bilirubin Urine: NEGATIVE
GLUCOSE, UA: NEGATIVE mg/dL
Hgb urine dipstick: NEGATIVE
KETONES UR: NEGATIVE mg/dL
NITRITE: NEGATIVE
PROTEIN: 100 mg/dL — AB
Specific Gravity, Urine: 1.017 (ref 1.005–1.030)
pH: 5 (ref 5.0–8.0)

## 2016-08-26 LAB — HEPATIC FUNCTION PANEL
ALBUMIN: 3.9 g/dL (ref 3.5–5.0)
ALT: 16 U/L (ref 14–54)
AST: 21 U/L (ref 15–41)
Alkaline Phosphatase: 83 U/L (ref 38–126)
BILIRUBIN TOTAL: 0.4 mg/dL (ref 0.3–1.2)
Total Protein: 7.6 g/dL (ref 6.5–8.1)

## 2016-08-26 LAB — CBC
HEMATOCRIT: 38.5 % (ref 36.0–46.0)
HEMOGLOBIN: 12.5 g/dL (ref 12.0–15.0)
MCH: 27.3 pg (ref 26.0–34.0)
MCHC: 32.5 g/dL (ref 30.0–36.0)
MCV: 84.1 fL (ref 78.0–100.0)
Platelets: 218 10*3/uL (ref 150–400)
RBC: 4.58 MIL/uL (ref 3.87–5.11)
RDW: 15.7 % — AB (ref 11.5–15.5)
WBC: 10.6 10*3/uL — ABNORMAL HIGH (ref 4.0–10.5)

## 2016-08-26 LAB — I-STAT TROPONIN, ED: Troponin i, poc: 0 ng/mL (ref 0.00–0.08)

## 2016-08-26 LAB — LIPASE, BLOOD: LIPASE: 62 U/L — AB (ref 11–51)

## 2016-08-26 LAB — BRAIN NATRIURETIC PEPTIDE: B NATRIURETIC PEPTIDE 5: 42.9 pg/mL (ref 0.0–100.0)

## 2016-08-26 LAB — CBG MONITORING, ED: GLUCOSE-CAPILLARY: 99 mg/dL (ref 65–99)

## 2016-08-26 MED ORDER — ONDANSETRON HCL 4 MG/2ML IJ SOLN
4.0000 mg | Freq: Once | INTRAMUSCULAR | Status: AC
  Administered 2016-08-26: 4 mg via INTRAVENOUS
  Filled 2016-08-26: qty 2

## 2016-08-26 MED ORDER — HYDROCODONE-ACETAMINOPHEN 5-325 MG PO TABS: 1.0000 | ORAL_TABLET | ORAL | 0 refills | Status: DC | PRN

## 2016-08-26 MED ORDER — HYDROCODONE-ACETAMINOPHEN 5-325 MG PO TABS
1.0000 | ORAL_TABLET | ORAL | 0 refills | Status: DC | PRN
Start: 2016-08-26 — End: 2017-01-07

## 2016-08-26 MED ORDER — ONDANSETRON HCL 4 MG PO TABS: 4.0000 mg | ORAL_TABLET | Freq: Three times a day (TID) | ORAL | 0 refills | Status: DC | PRN

## 2016-08-26 NOTE — ED Provider Notes (Signed)
[facility] DEPT Provider Note   CSN: [id number] Arrival date & time: 08/26/16  1546     History   Chief Complaint Chief Complaint  Patient presents with  . Abdominal Pain  . Dizziness    HPI Vicki [name] is a 82 y.o. female.  The history is provided by the patient, a relative and medical records.  Emesis   This is a new problem. The current episode started 1 to 2 hours ago. The problem occurs continuously. The problem has been gradually improving. The emesis has an appearance of stomach contents. There has been no fever. Pertinent negatives include no abdominal pain, no chills, no cough, no diarrhea, no fever and no headaches. Risk factors include suspect food intake ([employer]).    Past Medical History:  Diagnosis Date  . Arthritis    osteo  . Asthma    none in years  . Bradycardia   . Breast cancer ([place]) 2010   Left  . Chest pain   . Chronic kidney disease    states elevated creatnine- followed by Dr Joylene Draft  . Complication of anesthesia    following tympanoplasty in 1997- "HAD TIGHTENING AROUND CHEST FOLLOWING ANESTHESIA'- states has done well x 2 since  . Diabetes mellitus    borderline/diet controlled  . GERD (gastroesophageal reflux disease)   . Gout   . Hypertension   . Obesity   . Scoliosis   . Sleep apnea    states setting on 11- followed by Dr Westley Hummer yearly- last study 7 years ago  . Sleep apnea with use of continuous positive airway pressure (CPAP) 01/24/2013    Patient Active Problem List   Diagnosis Date Noted  . Hypertensive heart disease without heart failure 06/29/2016  . OSA on CPAP 01/22/2014  . Obesity hypoventilation syndrome ([place]) 01/22/2014  . Sleep apnea with use of continuous positive airway pressure (CPAP) 01/24/2013  . Obesity   . Hypertension   . Hx Breast Cancer, IDC, Stage I, left, receptor +, Her 2 + 01/01/2009    Past Surgical History:  Procedure Laterality Date  . BREAST LUMPECTOMY  07/2008   Left - Dr Margot Chimes  . CARPAL  TUNNEL RELEASE     right  . DILATION AND CURETTAGE OF UTERUS    . SHOULDER OPEN ROTATOR CUFF REPAIR  05/27/2011   Procedure: ROTATOR CUFF REPAIR SHOULDER OPEN;  Surgeon: Tobi Bastos, MD;  Location: [facility];  Service: Orthopedics;  Laterality: Right;  Right Shoulder Rotator Cuff Repair complex    . TONSILLECTOMY    . TYMPANOPLASTY  1997   right    OB History    Gravida Para Term Preterm AB Living   7 6 6   1 6    SAB TAB Ectopic Multiple Live Births   1               Home Medications    Prior to Admission medications   Medication Sig Start Date End Date Taking? Authorizing Provider  allopurinol (ZYLOPRIM) 300 MG tablet Take 150 mg by mouth daily.    [provider]  amLODipine (NORVASC) 2.5 MG tablet Take 2.5 mg by mouth daily. States takes 1100 AM    [provider]  aspirin 81 MG tablet Take 81 mg by mouth every other day.     [provider]  Calcium Carbonate-Vitamin D (CALCIUM + D PO) Take 600 Units by mouth daily.     [provider]  carvedilol (COREG) 12.5 MG tablet TAKE 1  TABLET TWICE DAILY WITH A MEAL. 07/17/16   Nahser, Wonda Cheng, MD  Cholecalciferol (VITAMIN D) 1000 UNITS capsule Take 1,000 Units by mouth daily.     [provider]  Coenzyme Q10 (CO Q 10 PO) Take 1 tablet by mouth daily.     [provider]  EPIPEN 2-PAK 0.3 MG/0.3ML DEVI  04/23/12   [provider]  HYDROcodone-acetaminophen (NORCO/VICODIN) 5-325 MG tablet Take 1-2 tablets by mouth every 6 (six) hours as needed for moderate pain. 05/29/15   Fredia Sorrow, MD  losartan (COZAAR) 100 MG tablet Take 100 mg by mouth at bedtime.    [provider]  methocarbamol (ROBAXIN) 500 MG tablet Take 1-2 tablets (500-1,000 mg total) by mouth every 6 (six) hours as needed for muscle spasms. 05/31/15   Waldemar Dickens, MD  Multiple Vitamin (MULTIVITAMIN) tablet Take 1 tablet by mouth daily.     [provider]  rosuvastatin (CRESTOR) 20 MG  tablet Take 10 mg by mouth every evening.     [provider]    Family History Family History  Problem Relation Age of Onset  . Diabetes Mother   . Breast cancer Sister        Age unknown  . Diabetes Sister   . Diabetes Brother   . Breast cancer Maternal Grandmother        Age unknown    Social History Social History  Substance Use Topics  . Smoking status: Former Smoker    Quit date: 04/06/1961  . Smokeless tobacco: Never Used  . Alcohol use No     Allergies   Shellfish allergy; Sulfa antibiotics; Pantoprazole sodium; and Sulfur   Review of Systems Review of Systems  Constitutional: Negative for chills, diaphoresis, fatigue and fever.  HENT: Negative for congestion and rhinorrhea.   Respiratory: Negative for cough, chest tightness, shortness of breath, wheezing and stridor.   Cardiovascular: Negative for chest pain and leg swelling.  Gastrointestinal: Positive for nausea and vomiting. Negative for abdominal pain, constipation and diarrhea.  Genitourinary: Negative for dysuria and flank pain.  Musculoskeletal: Negative for back pain, neck pain and neck stiffness.  Neurological: Positive for light-headedness. Negative for dizziness, syncope, facial asymmetry, weakness, numbness and headaches.  Psychiatric/Behavioral: Negative for agitation and confusion.  All other systems reviewed and are negative.    Physical Exam Updated Vital Signs BP (!) 179/73 (BP Location: Right Wrist)   Pulse 60   Temp 98.1 F (36.7 C) (Oral)   Resp 18   SpO2 98%   Physical Exam  Constitutional: She is oriented to person, place, and time. She appears well-developed and well-nourished. No distress.  HENT:  Head: Normocephalic and atraumatic.  Mouth/Throat: Oropharynx is clear and moist. No oropharyngeal exudate.  Eyes: Conjunctivae and EOM are normal.  Neck: Normal range of motion. Neck supple.  Cardiovascular: Normal rate, regular rhythm and intact distal pulses.   No murmur  heard. Pulmonary/Chest: Effort normal and breath sounds normal. No stridor. No respiratory distress. She has no wheezes. She exhibits no tenderness.  Abdominal: Soft. She exhibits no distension. There is no tenderness.  Musculoskeletal: She exhibits no edema or tenderness.  Neurological: She is alert and oriented to person, place, and time. She displays normal reflexes. No sensory deficit. She exhibits normal muscle tone.  Skin: Skin is warm and dry. No rash noted. No erythema.  Psychiatric: She has a normal mood and affect.  Nursing note and vitals reviewed.    ED Treatments / Results  Labs (  all labs ordered are listed, but only abnormal results are displayed) Labs Reviewed  BASIC METABOLIC PANEL - Abnormal; Notable for the following:       Result Value   Glucose, Bld 111 (*)    BUN 26 (*)    Creatinine, Ser 1.57 (*)    GFR calc non Af Amer 30 (*)    GFR calc Af Amer 35 (*)    All other components within normal limits  CBC - Abnormal; Notable for the following:    WBC 10.6 (*)    RDW 15.7 (*)    All other components within normal limits  URINALYSIS, ROUTINE W REFLEX MICROSCOPIC - Abnormal; Notable for the following:    Protein, ur 100 (*)    Leukocytes, UA SMALL (*)    Bacteria, UA RARE (*)    Squamous Epithelial / LPF 0-5 (*)    All other components within normal limits  HEPATIC FUNCTION PANEL - Abnormal; Notable for the following:    Bilirubin, Direct <0.1 (*)    All other components within normal limits  LIPASE, BLOOD - Abnormal; Notable for the following:    Lipase 62 (*)    All other components within normal limits  BRAIN NATRIURETIC PEPTIDE  CBG MONITORING, ED  I-STAT CG4 LACTIC ACID, ED  I-STAT TROPOININ, ED  I-STAT CG4 LACTIC ACID, ED    EKG  EKG Interpretation  Date/Time:  Wednesday Aug 26 2016 16:10:17 EDT Ventricular Rate:  59 PR Interval:    QRS Duration: 103 QT Interval:  454 QTC Calculation: 450 R Axis:   3 Text Interpretation:  Sinus rhythm  Abnormal R-wave progression, early transition When compared to prior, no significant chcnages seen.  No STEMI Confirmed by Antony Blackbird [phone]) on 08/26/2016 5:35:58 PM       Radiology Dg Chest 2 View  Result Date: 08/26/2016 CLINICAL DATA:  Lightheadedness, dizziness, nausea EXAM: CHEST  2 VIEW COMPARISON:  Chest radiograph 01/14/2011 FINDINGS: Normal mediastinum and cardiac silhouette. Normal pulmonary vasculature. No evidence of effusion, infiltrate, or pneumothorax. No acute bony abnormality. IMPRESSION: No acute cardiopulmonary process. Electronically Signed   By: Suzy Bouchard M.D.   On: 08/26/2016 19:09    Procedures Procedures (including critical care time)  Medications Ordered in ED Medications  ondansetron (ZOFRAN) injection 4 mg (4 mg Intravenous Given 08/26/16 2007)     Initial Impression / Assessment and Plan / ED Course  I have reviewed the triage vital signs and the nursing notes.  Pertinent labs & imaging results that were available during my care of the patient were reviewed by me and considered in my medical decision making (see chart for details).     Vicki [name] is a 82 y.o. female with a past medical history significant for asthma, CK D, hypertension, prior breast cancer, diabetes, and GERD who presents with one episode of nausea, vomiting, and lightheadedness. Patient reports that she did candidly During this afternoon appointment 2:00 and proximally 1 hour ago, she had an episode of nausea and vomiting. She reports that she was at the [place] office with her husband when she had a sudden onset lightheadedness. She said that she was near syncopal but did not was consciousness. She reports dizziness but then she describes it as lightheadedness. She denies room spinning. She denies any vision changes. She denies any pain in her chest, shortness of breath, or palpitations. She denies abdominal pain. She has no history of this. She denies any preceding symptoms  such as fevers, chills,  change in oral intake, and denies any urinary symptoms. She does report intermittent chronic constipation and diarrhea. Patient denies any new medication changes.  Patient says that she has felt better since coming to our department resting. She still feels slightly fatigued and nauseous but denies any more episodes of vomiting. She denies any blood in her emesis.  On exam, abdomen nontender. Chest nontender. No murmurs. Lungs clear. No focal neurologic deficits appreciated. When sitting up, patient had no dizziness. No vision changes.  Patient will work to look for dehydration and occult infection. Patient will have workup of her heart with laboratory testing. Patient we given nausea medication and observed.             Diagnostic testing results are seen above. Patient found to have elevation of lipase. Also mild leukocytosis. Hepatic function alkaline phosphatase nonelevated, doubt gallstone pancreatitis. Patient does not drink alcohol, doubt alcoholic pancreatitis. Suspect patient had pancreatitis related to fatty foods and lipids. Patient's other lab testing was grossly reassuring. Creatinine slightly elevated from prior but similar to previous readings.  Vernard Gambles felt much better after her Zofran. Patient able to tolerate fluids.  A stone description of nausea, vomiting, and lightheadedness with no sensation of true dizziness or head spinning, do not feel patient is having a neurologic cause of symptoms.  Suspect patient's either has pancreatitis or had vomiting from the food she ate at [facility] before arrival. Patient may have been lightheaded due to vagal tone from vomiting. Patient feels much better and was felt stable for discharge home. Patient instructed to eat bland foods and stay hydrated. Patient also given pressure for pain medicine and nausea medicine. Patient instructed to follow-up with her PCP for further management. Patient given strict return precautions. Patient  had no other questions or concerns and was discharged in good condition with resolution of presenting symptoms.  Patient discharged in good condition.   Final Clinical Impressions(s) / ED Diagnoses   Final diagnoses:  Acute pancreatitis, unspecified complication status, unspecified pancreatitis type  Non-intractable vomiting with nausea, unspecified vomiting type  Episodic lightheadedness    New Prescriptions Discharge Medication List as of 08/26/2016 10:50 PM    Norco Zofran  Clinical Impression: 1. Acute pancreatitis, unspecified complication status, unspecified pancreatitis type   2. Non-intractable vomiting with nausea, unspecified vomiting type   3. Episodic lightheadedness     Disposition: Discharge  Condition: Good  I have discussed the results, Dx and Tx plan with the pt(& family if present). He/she/they expressed understanding and agree(s) with the plan. Discharge instructions discussed at great length. Strict return precautions discussed and pt &/or family have verbalized understanding of the instructions. No further questions at time of discharge.    Discharge Medication List as of 08/26/2016 10:50 PM      Follow Up: Crist Infante, [place] [id number] [phone]  Schedule an appointment as soon as possible for a visit    [facility] [place] [id number] [place] [phone]  If symptoms worsen     Rakeb Kibble, Gwenyth Allegra, MD 08/27/16 [phone]

## 2016-08-26 NOTE — Discharge Instructions (Signed)
Please stay hydrated. Please take the pain medicine and nausea medicine to help with her symptoms. Please do not eat fatty foods and spicy foods until cleared by your primary care physician. Please call to schedule a follow-up appointment with her primary care physician for further evaluation. If any symptoms change or worsen, please return to the nearest emergency department.

## 2016-08-26 NOTE — ED Notes (Signed)
Writer attempted to get blood for I-stat, unsuccessful.

## 2016-08-26 NOTE — ED Triage Notes (Signed)
Pt here with spouse for his MD appt.  Pt became dizzy, nauseated, with abdominal cramping.  Pt felt fullness in ears.  Felt like she was going to pass out.  Pt now with no abdominal pain but has vomited large amount.  Pt is a diabetic.  No chest pain or shortness of breath.  Felt fine prior to event.

## 2016-08-26 NOTE — ED Notes (Signed)
I attempted to collect labs and was unsuccessful. 

## 2016-09-08 ENCOUNTER — Other Ambulatory Visit: Payer: Self-pay | Admitting: Internal Medicine

## 2016-09-08 DIAGNOSIS — K859 Acute pancreatitis without necrosis or infection, unspecified: Secondary | ICD-10-CM

## 2016-09-10 ENCOUNTER — Other Ambulatory Visit: Payer: Self-pay | Admitting: Internal Medicine

## 2016-09-10 DIAGNOSIS — K859 Acute pancreatitis without necrosis or infection, unspecified: Secondary | ICD-10-CM

## 2016-09-16 ENCOUNTER — Other Ambulatory Visit: Payer: Medicare Other

## 2016-09-17 ENCOUNTER — Ambulatory Visit
Admission: RE | Admit: 2016-09-17 | Discharge: 2016-09-17 | Disposition: A | Discharge status: Home / Self Care | Payer: Medicare Other | Source: Ambulatory Visit | Attending: Internal Medicine | Admitting: Internal Medicine

## 2016-09-17 DIAGNOSIS — K859 Acute pancreatitis without necrosis or infection, unspecified: Secondary | ICD-10-CM

## 2016-09-17 MED ORDER — IOPAMIDOL (ISOVUE-300) INJECTION 61%
100.0000 mL | Freq: Once | INTRAVENOUS | Status: AC | PRN
  Administered 2016-09-17: 100 mL via INTRAVENOUS

## 2016-10-19 DIAGNOSIS — F419 Anxiety disorder, unspecified: Secondary | ICD-10-CM | POA: Insufficient documentation

## 2016-10-26 ENCOUNTER — Other Ambulatory Visit: Payer: Self-pay | Admitting: Gynecology

## 2016-10-26 DIAGNOSIS — N63 Unspecified lump in unspecified breast: Secondary | ICD-10-CM

## 2016-12-18 ENCOUNTER — Ambulatory Visit
Admission: RE | Admit: 2016-12-18 | Discharge: 2016-12-18 | Disposition: A | Discharge status: Home / Self Care | Payer: Medicare Other | Source: Ambulatory Visit | Attending: Gynecology | Admitting: Gynecology

## 2016-12-18 ENCOUNTER — Other Ambulatory Visit: Payer: Self-pay | Admitting: Gynecology

## 2016-12-18 DIAGNOSIS — N6310 Unspecified lump in the right breast, unspecified quadrant: Secondary | ICD-10-CM

## 2016-12-18 DIAGNOSIS — N63 Unspecified lump in unspecified breast: Secondary | ICD-10-CM

## 2016-12-18 HISTORY — DX: Personal history of irradiation: Z92.3

## 2016-12-22 ENCOUNTER — Ambulatory Visit
Admission: RE | Admit: 2016-12-22 | Discharge: 2016-12-22 | Disposition: A | Discharge status: Home / Self Care | Payer: Medicare Other | Source: Ambulatory Visit | Attending: Gynecology | Admitting: Gynecology

## 2016-12-22 ENCOUNTER — Other Ambulatory Visit: Payer: Self-pay | Admitting: Gynecology

## 2016-12-22 DIAGNOSIS — N6310 Unspecified lump in the right breast, unspecified quadrant: Secondary | ICD-10-CM

## 2017-01-07 ENCOUNTER — Ambulatory Visit (INDEPENDENT_AMBULATORY_CARE_PROVIDER_SITE_OTHER): Payer: Medicare Other | Admitting: Gynecology

## 2017-01-07 ENCOUNTER — Encounter: Payer: Self-pay | Admitting: Gynecology

## 2017-01-07 VITALS — BP 130/76 | Ht 63.0 in | Wt 264.0 lb

## 2017-01-07 DIAGNOSIS — C50912 Malignant neoplasm of unspecified site of left female breast: Secondary | ICD-10-CM | POA: Diagnosis not present

## 2017-01-07 DIAGNOSIS — N952 Postmenopausal atrophic vaginitis: Secondary | ICD-10-CM | POA: Diagnosis not present

## 2017-01-07 DIAGNOSIS — Z01411 Encounter for gynecological examination (general) (routine) with abnormal findings: Secondary | ICD-10-CM

## 2017-01-07 NOTE — Patient Instructions (Signed)
Follow up in one year for annual exam 

## 2017-01-07 NOTE — Progress Notes (Signed)
    Vicki [name] 1934-07-14 [id number]        82 y.o.  [id number] for annual gynecologic exam.    Past medical history,surgical history, problem list, medications, allergies, family history and social history were all reviewed and documented as reviewed in the EPIC chart.  ROS:  Performed with pertinent positives and negatives included in the history, assessment and plan.   Additional significant findings :  None   Exam: Caryn Bee assistant Vitals:   01/07/17 1024  BP: 130/76  Weight: 264 lb (119.7 kg)  Height: 5\' 3"  (1.6 m)   Body mass index is 46.77 kg/m.  General appearance:  Normal affect, orientation and appearance. Skin: Grossly normal HEENT: Without gross lesions.  No cervical or supraclavicular adenopathy. Thyroid normal.  Lungs:  Clear without wheezing, rales or rhonchi Cardiac: RR, without RMG Abdominal:  Soft, nontender, without masses, guarding, rebound, organomegaly or hernia Breasts:  Examined lying and sitting without masses, retractions, discharge or axillary adenopathy. Pelvic:  Ext, BUS, Vagina: With atrophic changes  Cervix: With atrophic changes  Uterus: Difficult to palpate but no gross masses or tenderness  Adnexa: Without gross masses or tenderness    Anus and perineum: Normal   Rectovaginal: Normal sphincter tone without palpated masses or tenderness.    Assessment/Plan:  82 y.o. [id number] female for annual gynecologic exam.  1. Postmenopausal/atrophic genital changes. No significant hot flushes, night sweats, vaginal dryness or any vaginal bleeding. Continue to monitor report any issues or bleeding. 2. History of left breast cancer status post lumpectomy. Exam NED. Mammography 12/2016. 3. Pap smear 2013. No Pap smear done today. No history of abnormal Pap smears. We discussed current screening guidelines and she is comfortable with stop screening based on age. 4. Colonoscopy 2015. Repeat at their recommended interval. 5. DEXA reported 2016 at Dr.  Silvestre Mesi office. Do not have copy of this but she'll continue to follow up with them in reference to bone health. 6. Health maintenance. No routine lab work done as patient does this elsewhere. We discussed the issues as to when to stop having routine GYN exams based on age. At this point patient feels more comfortable with annual exams. Will follow up in one year, sooner as needed.   [name], 10:45 AM 01/07/2017

## 2017-01-28 ENCOUNTER — Ambulatory Visit: Payer: Medicare Other | Admitting: Neurology

## 2017-02-05 ENCOUNTER — Ambulatory Visit (INDEPENDENT_AMBULATORY_CARE_PROVIDER_SITE_OTHER): Payer: Medicare Other | Admitting: Neurology

## 2017-02-05 ENCOUNTER — Encounter: Payer: Self-pay | Admitting: Neurology

## 2017-02-05 VITALS — BP 142/58 | HR 52 | Ht 65.0 in | Wt 265.0 lb

## 2017-02-05 DIAGNOSIS — N183 Chronic kidney disease, stage 3 unspecified: Secondary | ICD-10-CM

## 2017-02-05 DIAGNOSIS — Z9989 Dependence on other enabling machines and devices: Secondary | ICD-10-CM

## 2017-02-05 DIAGNOSIS — G4733 Obstructive sleep apnea (adult) (pediatric): Secondary | ICD-10-CM | POA: Diagnosis not present

## 2017-02-05 DIAGNOSIS — I10 Essential (primary) hypertension: Secondary | ICD-10-CM | POA: Diagnosis not present

## 2017-02-05 NOTE — Patient Instructions (Signed)
Dr. Dennard Nip, medical weight management at [facility] , Located at  [facility] . Marland Kitchen

## 2017-02-05 NOTE — Addendum Note (Signed)
Addended by: Larey Seat on: 02/05/2017 11:14 AM   Modules accepted: Orders

## 2017-02-05 NOTE — Progress Notes (Addendum)
PATIENT: Vicki [name] DOB: 1934-07-22  REASON FOR VISIT: follow up-obstructive sleep apnea on CPAP HISTORY FROM: patient and husband .  HISTORY OF PRESENT ILLNESS:  Vicki [name] has been my sleep apnea patient for over 12 years, she is an afro-american married lady meanwhile 82 years of age- and feels and looks well.  Today is 05 February 2017 and she is here for a compliance visit.  In 2010 she was diagnosed with breast cancer which was surgically treated ( Dr. Neldon Mc ) only. She reports the tragic loss of her son at age 28 earlier this summer after a long history of polysubstance abuse. Her husband is now 42 and still works part time. She was very worried over his infrequent syncopes- and after he had finally been evaluated by a loop recorder a ventricular tachycardia was found. He doesn't tolerate the medication though. Vicki [name] has used the machine with 100% compliance for 9 hours nightly, with a setting of 12 cmH2O - 2 cm EPR  Her residual AHI is 0.7.  She does have some significant air leaks, I would like to change her from nasal pillow to nasal covering mask and recommended the [employer] wisp model. Her humdidier is broken and cannot be repaired- I will order auto set CPAP 5-12 cm , a new machine.    Today 01/28/2016: Vicki [name] is a 82 year old female with a history of obstructive sleep apnea on CPAP. She returns today for follow-up. Her compliance download indicates that she uses her machine 30 out of 30 days for compliance of 100%. She uses her machine greater than 4 hours 29 out of 30 days for compliance of 97%. She has a residual AHI is 0.5 on 12 cm water with EPR 2. She does not have a significant leak. She feels that the CPAP is working well for her. Her Epworth sleepiness score is 10 and fatigue severity score is 49. She contributes the elevation in her scores to her pain. She states that she's had 2 rotator cuff surgeries, osteoarthritis and was recently  diagnosis with some "back issues." She states that surgery was recommended however she declined and is just in pain management at [facility]. She returns today for an evaluation.  HISTORY copied from Dr. Edwena Felty notes: Vicki [name] , a right handed , married lady from [place] is retired. She was  diagnosed with obstructive sleep apnea , the sleep study took place in 2005.  She is seen today on 01-23-15 and recently had a download of her CPAP machine through her DME, [facility]. Dated 12-10-14 she was 100% compliance for 90 days of use and 100% of those days for over 4 hours of continued use. Her average usage was 7 hours and 42 minutes. Set pressure remains at 12 cm water pressure and her EPR is set at 2 cm water the residual AHI is 0.6. She does have moderate to severe air leaks at times. Vicki [name] reports that her sleep has been impaired not by apnea but by knee pain and other arthritis and degenerative joint disease related discomfort. She endorsed today the Epworth sleepiness score at 9 points and the fatigue severity score at 45 points. She does not feel depressed her geriatric depression score is endorsed at 2 out of 15 points her sleep habits has not overall changed but the sleep quality has changed with the pain. She usually goes to bed around 11 PM she falls asleep rather promptly she states but  wakes up frequently through the night she also has trouble finding a comfortable sleep position. She wakes up about 3 times at night goes to the bathroom twice on average. She does fall asleep again she rises in the morning at about 6 AM -she has never used an alarm and her inner clock is well trained ! She rearely naps in daytime, just sitting will hurt her back,  She drinks decaf the native coffee in the morning, and she may have to iced teas a week and disorder a week. Her caffeine intake is truly low. She is a nonsmoker lifelong and a nondrinker. She has no shift work  history.      REVIEW OF SYSTEMS: Out of a complete 14 system review of symptoms, the patient complains only of the following symptoms, and all other reviewed systems are negative.  Epworth 5 and FSS 24 points, download can only be obtained through DME-  Blood and urine joint pain, back pain, aching muscles, neck pain, neck stiffness, moles, weakness, food allergies- Gi upset, apnea, frequent waking, leg swelling  ALLERGIES: Allergies  Allergen Reactions  . Shellfish Allergy Swelling  . Sulfa Antibiotics Hives  . Pantoprazole Sodium Palpitations  . Sulfur Swelling and Rash    HOME MEDICATIONS: Outpatient Medications Prior to Visit  Medication Sig Dispense Refill  . allopurinol (ZYLOPRIM) 300 MG tablet Take 150 mg by mouth daily.    Marland Kitchen amLODipine (NORVASC) 5 MG tablet Take 5 mg by mouth daily.     Marland Kitchen aspirin 81 MG tablet Take 81 mg by mouth every other day.     . carvedilol (COREG) 12.5 MG tablet TAKE 1 TABLET TWICE DAILY WITH A MEAL. 180 tablet 3  . Cholecalciferol (VITAMIN D) 1000 UNITS capsule Take 1,000 Units by mouth daily.     . Coenzyme Q10 (CO Q 10 PO) Take 1 tablet by mouth daily.     Marland Kitchen EPIPEN 2-PAK 0.3 MG/0.3ML DEVI 0.3 mg.     . HYDROcodone-acetaminophen (NORCO/VICODIN) 5-325 MG tablet Take 1-2 tablets by mouth every 6 (six) hours as needed for moderate pain. 10 tablet 0  . losartan (COZAAR) 100 MG tablet Take 100 mg by mouth at bedtime.    . rosuvastatin (CRESTOR) 20 MG tablet Take 10 mg by mouth every evening.     . ondansetron (ZOFRAN) 4 MG tablet Take 1 tablet (4 mg total) by mouth every 8 (eight) hours as needed for nausea or vomiting. (Patient not taking: Reported on 01/07/2017) 12 tablet 0   No facility-administered medications prior to visit.     PAST MEDICAL HISTORY: Past Medical History:  Diagnosis Date  . Arthritis    osteo  . Asthma    none in years  . Bradycardia   . Breast cancer ([facility]) 2010   Left  . Chest pain   . Chronic kidney disease     states elevated creatnine- followed by Dr Joylene Draft  . Complication of anesthesia    following tympanoplasty in 1997- "HAD TIGHTENING AROUND CHEST FOLLOWING ANESTHESIA'- states has done well x 2 since  . Diabetes mellitus    borderline/diet controlled  . GERD (gastroesophageal reflux disease)   . Gout   . Hypertension   . Obesity   . Personal history of radiation therapy   . Scoliosis   . Sleep apnea    states setting on 11- followed by Dr Westley Hummer yearly- last study 7 years ago  . Sleep apnea with use of continuous positive airway pressure (CPAP) 01/24/2013  PAST SURGICAL HISTORY: Past Surgical History:  Procedure Laterality Date  . BREAST LUMPECTOMY Left 07/2008   Left - Dr Margot Chimes  . CARPAL TUNNEL RELEASE     right  . DILATION AND CURETTAGE OF UTERUS    . SHOULDER OPEN ROTATOR CUFF REPAIR  05/27/2011   Procedure: ROTATOR CUFF REPAIR SHOULDER OPEN;  Surgeon: Tobi Bastos, MD;  Location: [facility];  Service: Orthopedics;  Laterality: Right;  Right Shoulder Rotator Cuff Repair complex    . TONSILLECTOMY    . TYMPANOPLASTY  1997   right    FAMILY HISTORY: Family History  Problem Relation Age of Onset  . Diabetes Mother   . Breast cancer Sister        Age unknown  . Diabetes Sister   . Diabetes Brother   . Breast cancer Maternal Grandmother        Age unknown    SOCIAL HISTORY: Social History   Social History  . Marital status: Married    Spouse name: N/A  . Number of children: N/A  . Years of education: N/A   Occupational History  . Not on file.   Social History Main Topics  . Smoking status: Former Smoker    Quit date: 04/06/1961  . Smokeless tobacco: Never Used  . Alcohol use No  . Drug use: No  . Sexual activity: Yes    Birth control/ protection: Post-menopausal     Comment: 1st intercourse 69 yo-5 partners   Other Topics Concern  . Not on file   Social History Narrative   Right handed, Married, 6 kids, Caffeine 1 cup daily, Retired,  [employer] grad,        PHYSICAL EXAM  Vitals:   02/05/17 1037  BP: (!) 142/58  Pulse: (!) 52  Weight: 265 lb (120.2 kg)  Height: 5\' 5"  (1.651 m)   Body mass index is 44.1 kg/m. still morbidly obese.   Generalized: Well developed, in no acute distress , groomed  Neck: 17-1/2 inches- Circumference, Mallampati 3+  Neurological examination  Mentation: Alert oriented to time, place, history taking. Follows all commands speech and language fluent Cranial nerve :  Taste and smell are unchanged, ( intact) she has no olfactory dysesthesias or auras.  Pupils were equal round reactive to light. Extraocular movements were full, her visual field were full on confrontational test.  Facial sensation and strength were normal. Uvula and tongue midline, no tongue bites, no tremor. Head turning and shoulder shrug  were normal and symmetric. Motor:  5 / 5 strength of all 4 extremities. G DIAGNOSTIC DATA (LABS, IMAGING, TESTING) - I reviewed patient records, labs, notes, testing and imaging myself where available.  Lab Results  Component Value Date   WBC 10.6 (H) 08/26/2016   HGB 12.5 08/26/2016   HCT 38.5 08/26/2016   MCV 84.1 08/26/2016   PLT 218 08/26/2016      Component Value Date/Time   NA 140 08/26/2016 1737   NA 140 07/06/2013 0900   K 4.0 08/26/2016 1737   K 3.9 07/06/2013 0900   CL 106 08/26/2016 1737   CL 106 07/05/2012 1101   CO2 24 08/26/2016 1737   CO2 20 (L) 07/06/2013 0900   GLUCOSE 111 (H) 08/26/2016 1737   GLUCOSE 170 (H) 07/06/2013 0900   GLUCOSE 182 (H) 07/05/2012 1101   BUN 26 (H) 08/26/2016 1737   BUN 21.2 07/06/2013 0900   CREATININE 1.57 (H) 08/26/2016 1737   CREATININE 1.4 (H) 07/06/2013 0900   CALCIUM 8.9  08/26/2016 1737   CALCIUM 9.4 07/06/2013 0900   PROT 7.6 08/26/2016 1737   PROT 7.3 07/06/2013 0900   ALBUMIN 3.9 08/26/2016 1737   ALBUMIN 3.6 07/06/2013 0900   AST 21 08/26/2016 1737   AST 14 07/06/2013 0900   ALT 16 08/26/2016 1737   ALT 13 07/06/2013 0900    ALKPHOS 83 08/26/2016 1737   ALKPHOS 102 07/06/2013 0900   BILITOT 0.4 08/26/2016 1737   BILITOT 0.37 07/06/2013 0900   GFRNONAA 30 (L) 08/26/2016 1737   GFRAA 35 (L) 08/26/2016 1737      ASSESSMENT AND PLAN 81 y.o. year old female  has a past medical history of Arthritis; Asthma; Bradycardia; Breast cancer ([place]) (2010); Chest pain; Chronic kidney disease; Complication of anesthesia; Diabetes mellitus; GERD (gastroesophageal reflux disease); Gout; Hypertension; Obesity; Personal history of radiation therapy; Scoliosis; Sleep apnea; and Sleep apnea with use of continuous positive airway pressure (CPAP) (01/24/2013). here with:  1. Obstructive sleep apnea on CPAP 2. Grieving the loss of a son in March 2018 . He succumbed to opiate addiction. 3. "Pancreatic" abdominal pain attack in summer 2018, has seen Gi.   4. Obesity- remains severe with BMI 44 plus- CKD II, I like for her to consider a MWM program, gave her the name of Dr. Dennard Nip.  5. Breast cancer survivor, dx in 2010, treated with radiation- on Femara for 5 years- mammogram yearly and normal.   The patient's download shows again excellent compliance with good treatment of her apnea. She has reported her machine to not longer function well and the humidifier is broken- she needs a new device- I will order auto titration CPAP 5-12 cm water , likes to keep a nasal mask.   She is encouraged to continue using the CPAP nightly, and she will hopefully get a new machine without re testing.   Patient verbalized understanding.  She will follow-up in 3 month with NP to check function and efficacy of CPAP- Dr. Stefan Church, MD  02/05/2017, 10:40 AM [facility] [address], [place], [place] [address] [phone]

## 2017-02-11 ENCOUNTER — Other Ambulatory Visit: Payer: Self-pay | Admitting: Neurology

## 2017-02-11 DIAGNOSIS — G4733 Obstructive sleep apnea (adult) (pediatric): Secondary | ICD-10-CM

## 2017-02-15 ENCOUNTER — Other Ambulatory Visit: Payer: Self-pay | Admitting: Neurology

## 2017-02-15 DIAGNOSIS — G4733 Obstructive sleep apnea (adult) (pediatric): Secondary | ICD-10-CM

## 2017-03-01 ENCOUNTER — Ambulatory Visit (INDEPENDENT_AMBULATORY_CARE_PROVIDER_SITE_OTHER): Payer: Medicare Other | Admitting: Neurology

## 2017-03-01 DIAGNOSIS — G4733 Obstructive sleep apnea (adult) (pediatric): Secondary | ICD-10-CM | POA: Diagnosis not present

## 2017-03-04 ENCOUNTER — Other Ambulatory Visit: Payer: Self-pay | Admitting: Neurology

## 2017-03-04 DIAGNOSIS — N182 Chronic kidney disease, stage 2 (mild): Secondary | ICD-10-CM

## 2017-03-04 DIAGNOSIS — I119 Hypertensive heart disease without heart failure: Secondary | ICD-10-CM

## 2017-03-04 DIAGNOSIS — G4730 Sleep apnea, unspecified: Secondary | ICD-10-CM

## 2017-03-04 NOTE — Procedures (Signed)
NAME:  Vicki [name]                                                                 DOB: [birth year] MEDICAL RECORD No:  [id number]                                        DOS: 03/02/2017 REFERRING PHYSICIAN: Larey Seat, M.D.  STUDY PERFORMED: Home Sleep Study, Apnea Link HISTORY: 82 y.o. year old female Patient with Dx of Gout and Osteo- Arthritis; Asthma; Bradycardia;; Chest pain; Chronic kidney disease; Diabetes mellitus; GERD (gastroesophageal reflux disease); Hypertension; Morbid Obesity; Breast cancer ([place]) (2010)-Personal history of radiation therapy; Scoliosis; and Sleep apnea with use of continuous positive airway pressure (CPAP) (01/24/2013).  The patient's CPAP download shows again excellent compliance with good treatment of her apnea. She has reported her machine to no longer function well and the humidifier is broken- she needs a new device. HST was ordered to demonstrate if the OSA is still present. BMI is 44.1.        STUDY RESULTS:  Total Recording Time: 6 hours 42 minutes Total Apnea/Hypopnea Index (RDI):  5.6/hour, desaturation index was 8.9/hr. Average Oxygen Saturation: 92%Sp02; Lowest Oxygen Saturation: 81% Sp02 Total Time Oxygen Saturation below 89 %: 21 minutes = 6 % Average Heart Rate: 62 bpm (40-225 bpm)  IMPRESSION: Very mild apnea is still present, without prolonged hypoxemia.  There is a highly variable heart rate noted (unclear if the mild apnea causes this).  RECOMMENDATION:  I would offer the patient a new CPAP machine if she is wanting to continue therapy. Her rather mild apnea makes it possible to consider using alternative treatments, such as a dental device.  I will place an auto PAP order for the patient , pressure between 5-12 cm water , 2 cm EPR and interface of her choice.   I certify that I have reviewed the raw data recording prior to the issuance of this report in accordance with the standards of the American Academy of Sleep Medicine (AASM). Larey Seat, M.D.    03-04-2017   Medical Director of [facility], [place], [place] and [facility] and accredited by AASM

## 2017-03-08 ENCOUNTER — Telehealth: Payer: Self-pay | Admitting: Neurology

## 2017-03-08 NOTE — Telephone Encounter (Signed)
-----   Message from Larey Seat, MD sent at 03/04/2017  4:02 PM EST ----- MRs. Schuur's apnea is very mild and she can choose to continue CPAP or not. I placed an auto-titration CPAP order as noted in recommendations.

## 2017-03-08 NOTE — Telephone Encounter (Signed)
Called patient to discuss sleep study results. No answer at this time. LVM for the patient to call back.   

## 2017-03-08 NOTE — Telephone Encounter (Signed)
Pt returned call and I was able to discuss the sleep study results. The patent states that she would like to proceed forward with using CPAP. I instructed her about the auto CPAP. I have sent the orders already to [facility] and the patient is scheduled to have a follow up with Cecille Rubin 06/04/2016 at 8:30 am. Pt verbalized understanding. Pt had no questions at this time but was encouraged to call back if questions arise.

## 2017-06-02 ENCOUNTER — Encounter: Payer: Self-pay | Admitting: Neurology

## 2017-06-03 NOTE — Progress Notes (Signed)
[facility]  PATIENT: TWYLAH BENNETTS DOB: [birth year]   REASON FOR VISIT: Obstructive sleep apnea, a new machine with initial compliance HISTORY FROM: Patient   HISTORY OF PRESENT ILLNESS:UPDATE 3/1/2019CM Ms. Nyquist, 83 year old female returns for follow-up.  She has a history of obstructive sleep apnea and just recently obtained a new machine she is here for compliance.  She is tolerating CPAP without difficulty.  Compliance data dated 05/04/2017 - 06/02/2017 shows compliance greater than 4 hours at 100%.  Average usage 9 hours 5 minutes.  Set pressure 5-12 cm EPR 1.  AHI 1.9.  ESS 2.  She returns for reevaluation 02/05/17 CDMrs. Molino has been my sleep apnea patient for over 12 years, she is an afro-american married lady meanwhile 83 years of age- and feels and looks well.  Today is 05 February 2017 and she is here for a compliance visit.  In 2010 she was diagnosed with breast cancer which was surgically treated ( Dr. Neldon Mc ) only. She reports the tragic loss of her son at age 39 earlier this summer after a long history of polysubstance abuse. Her husband is now 94 and still works part time. She was very worried over his infrequent syncopes- and after he had finally been evaluated by a loop recorder a ventricular tachycardia was found. He doesn't tolerate the medication though. Mrs. Mcguinn has used the machine with 100% compliance for 9 hours nightly, with a setting of 12 cmH2O - 2 cm EPR  Her residual AHI is 0.7.  She does have some significant air leaks, I would like to change her from nasal pillow to nasal covering mask and recommended the [employer] wisp model. Her humdidier is broken and cannot be repaired- I will order auto set CPAP 5-12 cm , a new machine.    Today 01/28/2016: Ms. Steinmetz is a 83 year old female with a history of obstructive sleep apnea on CPAP. She returns today for follow-up. Her compliance download indicates that she uses her  machine 30 out of 30 days for compliance of 100%. She uses her machine greater than 4 hours 29 out of 30 days for compliance of 97%. She has a residual AHI is 0.5 on 12 cm water with EPR 2. She does not have a significant leak. She feels that the CPAP is working well for her. Her Epworth sleepiness score is 10 and fatigue severity score is 49. She contributes the elevation in her scores to her pain. She states that she's had 2 rotator cuff surgeries, osteoarthritis and was recently diagnosis with some "back issues." She states that surgery was recommended however she declined and is just in pain management at [facility]. She returns today for an evaluation.  HISTORY copied from Dr. Edwena Felty notes: Mrs. Rowton , a right handed , married lady from [place] is retired. She was diagnosed with obstructive sleep apnea , the sleep study took place in 2005.  She is seen today on 01-23-15 and recently had a download of her CPAP machine through her DME, [employer]. Dated 12-10-14 she was 100% compliance for 90 days of use and 100% of those days for over 4 hours of continued use. Her average usage was 7 hours and 42 minutes. Set pressure remains at 12 cm water pressure and her EPR is set at 2 cm water the residual AHI is 0.6. She does have moderate to severe air leaks at times. Mrs. Kielty reports that her sleep has been impaired not by apnea  but by knee pain and other arthritis and degenerative joint disease related discomfort. She endorsed today the Epworth sleepiness score at 9 points and the fatigue severity score at 45 points. She does not feel depressed her geriatric depression score is endorsed at 2 out of 15 points her sleep habits has not overall changed but the sleep quality has changed with the pain. She usually goes to bed around 11 PM she falls asleep rather promptly she states but wakes up frequently through the night she also has trouble finding a comfortable sleep position. She  wakes up about 3 times at night goes to the bathroom twice on average. She does fall asleep again she rises in the morning at about 6 AM -she has never used an alarm and her inner clock is well trained ! She rearely naps in daytime, just sitting will hurt her back,  She drinks decaf the native coffee in the morning, and she may have to iced teas a week and disorder a week. Her caffeine intake is truly low. She is a nonsmoker lifelong and a nondrinker. She has no shift work history.    REVIEW OF SYSTEMS: Full 14 system review of systems performed and notable only for those listed, all others are neg:  Constitutional: neg  Cardiovascular: neg Ear/Nose/Throat: neg  Skin: neg Eyes: neg Respiratory: neg Gastroitestinal: neg  Hematology/Lymphatic: neg  Endocrine: neg Musculoskeletal:neg Allergy/Immunology: neg Neurological: neg Psychiatric: neg Sleep : Obstructive sleep apnea with CPAP   ALLERGIES: Allergies  Allergen Reactions  . Shellfish Allergy Swelling  . Sulfa Antibiotics Hives  . Pantoprazole Sodium Palpitations  . Sulfur Swelling and Rash    HOME MEDICATIONS: Outpatient Medications Prior to Visit  Medication Sig Dispense Refill  . allopurinol (ZYLOPRIM) 300 MG tablet Take 150 mg by mouth daily.    Marland Kitchen amLODipine (NORVASC) 5 MG tablet Take 5 mg by mouth daily.     Marland Kitchen aspirin 81 MG tablet Take 81 mg by mouth every other day.     . carvedilol (COREG) 12.5 MG tablet TAKE 1 TABLET TWICE DAILY WITH A MEAL. 180 tablet 3  . Cholecalciferol (VITAMIN D) 1000 UNITS capsule Take 1,000 Units by mouth daily.     . Coenzyme Q10 (CO Q 10 PO) Take 1 tablet by mouth daily.     Marland Kitchen EPIPEN 2-PAK 0.3 MG/0.3ML DEVI 0.3 mg.     . furosemide (LASIX) 20 MG tablet Take by mouth.    Marland Kitchen HYDROcodone-acetaminophen (NORCO/VICODIN) 5-325 MG tablet Take 1-2 tablets by mouth every 6 (six) hours as needed for moderate pain. 10 tablet 0  . losartan (COZAAR) 100 MG tablet Take 100 mg by mouth at bedtime.    .  rosuvastatin (CRESTOR) 20 MG tablet Take 10 mg by mouth every evening.      No facility-administered medications prior to visit.     PAST MEDICAL HISTORY: Past Medical History:  Diagnosis Date  . Arthritis    osteo  . Asthma    none in years  . Bradycardia   . Breast cancer ([place]) 2010   Left  . Chest pain   . Chronic kidney disease    states elevated creatnine- followed by Dr Joylene Draft  . Complication of anesthesia    following tympanoplasty in 1997- "HAD TIGHTENING AROUND CHEST FOLLOWING ANESTHESIA'- states has done well x 2 since  . Diabetes mellitus    borderline/diet controlled  . GERD (gastroesophageal reflux disease)   . Gout   . Hypertension   . Obesity   .  Personal history of radiation therapy   . Scoliosis   . Sleep apnea    states setting on 11- followed by Dr Westley Hummer yearly- last study 7 years ago  . Sleep apnea with use of continuous positive airway pressure (CPAP) 01/24/2013    PAST SURGICAL HISTORY: Past Surgical History:  Procedure Laterality Date  . BREAST LUMPECTOMY Left 07/2008   Left - Dr Margot Chimes  . CARPAL TUNNEL RELEASE     right  . DILATION AND CURETTAGE OF UTERUS    . SHOULDER OPEN ROTATOR CUFF REPAIR  05/27/2011   Procedure: ROTATOR CUFF REPAIR SHOULDER OPEN;  Surgeon: Tobi Bastos, MD;  Location: [facility];  Service: Orthopedics;  Laterality: Right;  Right Shoulder Rotator Cuff Repair complex    . TONSILLECTOMY    . TYMPANOPLASTY  1997   right    FAMILY HISTORY: Family History  Problem Relation Age of Onset  . Diabetes Mother   . Breast cancer Sister        Age unknown  . Diabetes Sister   . Diabetes Brother   . Breast cancer Maternal Grandmother        Age unknown    SOCIAL HISTORY: Social History   Socioeconomic History  . Marital status: Married    Spouse name: Not on file  . Number of children: Not on file  . Years of education: Not on file  . Highest education level: Not on file  Social Needs  . Financial resource  strain: Not on file  . Food insecurity - worry: Not on file  . Food insecurity - inability: Not on file  . Transportation needs - medical: Not on file  . Transportation needs - non-medical: Not on file  Occupational History  . Not on file  Tobacco Use  . Smoking status: Former Smoker    Last attempt to quit: 04/06/1961    Years since quitting: 56.2  . Smokeless tobacco: Never Used  Substance and Sexual Activity  . Alcohol use: No  . Drug use: No  . Sexual activity: Yes    Birth control/protection: Post-menopausal    Comment: 1st intercourse 37 yo-5 partners  Other Topics Concern  . Not on file  Social History Narrative   Right handed, Married, 6 kids, Caffeine 1 cup daily, Retired,  [employer] grad,      PHYSICAL EXAM  Vitals:   06/04/17 0817  BP: (!) 138/57  Pulse: 61  Weight: 270 lb 9.6 oz (122.7 kg)  Height: 5\' 5"  (1.651 m)   Body mass index is 45.03 kg/m.  Generalized: Well developed, morbidly obese female in no acute distress  Head: normocephalic and atraumatic,. Oropharynx benign  Neck: Supple,  Musculoskeletal: No deformity   Neurological examination   Mentation: Alert oriented to time, place, history taking. Attention span and concentration appropriate. Recent and remote memory intact.  Follows all commands speech and language fluent.   Cranial nerve II-XII: Pupils were equal round reactive to light extraocular movements were full, visual field were full on confrontational test. Facial sensation and strength were normal. hearing was intact to finger rubbing bilaterally. Uvula tongue midline. head turning and shoulder shrug were normal and symmetric.Tongue protrusion into cheek strength was normal. Motor: normal bulk and tone, full strength in the BUE, BLE,  Sensory: normal and symmetric to light touch, Coordination: finger-nose-finger, heel-to-shin bilaterally, no dysmetria Reflexes: Symmetric upper and lower plantar responses were flexor bilaterally. Gait and  Station: Rising up from seated position without assistance, normal stance,  moderate  stride, good arm swing, smooth turning, able to perform tiptoe, and heel walking without difficulty. Tandem gait is unsteady.  No assistive device  DIAGNOSTIC DATA (LABS, IMAGING, TESTING) - I reviewed patient records, labs, notes, testing and imaging myself where available.  Lab Results  Component Value Date   WBC 10.6 (H) 08/26/2016   HGB 12.5 08/26/2016   HCT 38.5 08/26/2016   MCV 84.1 08/26/2016   PLT 218 08/26/2016      Component Value Date/Time   NA 140 08/26/2016 1737   NA 140 07/06/2013 0900   K 4.0 08/26/2016 1737   K 3.9 07/06/2013 0900   CL 106 08/26/2016 1737   CL 106 07/05/2012 1101   CO2 24 08/26/2016 1737   CO2 20 (L) 07/06/2013 0900   GLUCOSE 111 (H) 08/26/2016 1737   GLUCOSE 170 (H) 07/06/2013 0900   GLUCOSE 182 (H) 07/05/2012 1101   BUN 26 (H) 08/26/2016 1737   BUN 21.2 07/06/2013 0900   CREATININE 1.57 (H) 08/26/2016 1737   CREATININE 1.4 (H) 07/06/2013 0900   CALCIUM 8.9 08/26/2016 1737   CALCIUM 9.4 07/06/2013 0900   PROT 7.6 08/26/2016 1737   PROT 7.3 07/06/2013 0900   ALBUMIN 3.9 08/26/2016 1737   ALBUMIN 3.6 07/06/2013 0900   AST 21 08/26/2016 1737   AST 14 07/06/2013 0900   ALT 16 08/26/2016 1737   ALT 13 07/06/2013 0900   ALKPHOS 83 08/26/2016 1737   ALKPHOS 102 07/06/2013 0900   BILITOT 0.4 08/26/2016 1737   BILITOT 0.37 07/06/2013 0900   GFRNONAA 30 (L) 08/26/2016 1737   GFRAA 35 (L) 08/26/2016 1737    ASSESSMENT AND PLAN  83 y.o. year old female  has a past medical history of Arthritis, Asthma, Bradycardia, Breast cancer ([place]) (2010), Chest pain, Chronic kidney disease, Complication of anesthesia, Diabetes mellitus, GERD (gastroesophageal reflux disease), Gout, Hypertension, Obesity, Personal history of radiation therapy, Scoliosis, Sleep apnea, and Sleep apnea with use of continuous positive airway pressure (CPAP) (01/24/2013). here to follow-up for her  obstructive sleep apnea.  She has a new CPAP machine.  She remains morbidly obese.  Compliance data dated 05/04/2017 - 06/02/2017 shows compliance greater than 4 hours at 100%.  Average usage 9 hours 5 minutes.  Set pressure 5-12 cm EPR 1.  AHI 1.9.  ESS 2.    PLAN: CPAP compliance 100% reviewed data with patient Continue same settings Follow-up yearly and as needed Dennie Bible, [facility], [facility], APRN  [facility] [address] [place], [place] [address] [phone]

## 2017-06-04 ENCOUNTER — Ambulatory Visit: Payer: Medicare Other | Admitting: Nurse Practitioner

## 2017-06-04 ENCOUNTER — Encounter: Payer: Self-pay | Admitting: Nurse Practitioner

## 2017-06-04 VITALS — BP 138/57 | HR 61 | Ht 65.0 in | Wt 270.6 lb

## 2017-06-04 DIAGNOSIS — Z9989 Dependence on other enabling machines and devices: Secondary | ICD-10-CM

## 2017-06-04 DIAGNOSIS — G4733 Obstructive sleep apnea (adult) (pediatric): Secondary | ICD-10-CM

## 2017-06-04 NOTE — Progress Notes (Signed)
I agree with the assessment and plan as directed by NP . HST was ordered to demonstrate if the OSA is still present. BMI was 44.1. The HST from 02-17-2017 documented only mild apnea. Auto titration device was ordered, pressures between 5-12 cm water. This was the first compliance visit on a new machine . [employer] wisp model.       Average Heart Rate: 62 bpm (40-225 bpm) Larey Seat, MD

## 2017-06-04 NOTE — Patient Instructions (Signed)
CPAP compliance 100% Continue same settings Follow-up yearly and as needed  

## 2017-06-07 ENCOUNTER — Ambulatory Visit: Payer: Medicare Other | Admitting: Adult Health

## 2017-06-30 ENCOUNTER — Telehealth: Payer: Self-pay | Admitting: Nurse Practitioner

## 2017-06-30 NOTE — Telephone Encounter (Signed)
Pt has called stating that she has spoken with [place] and she has been told that the insurance company has made 3 attempts to obtain notes from pt's 06-04-2017 visits.  As a result of not getting needed information pt is being told she is have to pay for the cost of the CPAP .  Pt has made it clear that she does not feel this is her fault nor is she able to pay for the CPAP.  Pt is asking for a call re: resolution on [facility] and all other applicable parties  About documents needed for the cost of the CPAP.  Please call

## 2017-06-30 NOTE — Telephone Encounter (Signed)
LVM informing patient that this office sent notes to [facility] for her CPAP insurance coverage. Advised she should call [facility] if she has any other questions. Also left this office's number for any further questions.

## 2017-07-12 ENCOUNTER — Other Ambulatory Visit: Payer: Self-pay | Admitting: Cardiovascular Disease

## 2017-08-04 ENCOUNTER — Encounter: Payer: Self-pay | Admitting: Cardiovascular Disease

## 2017-08-04 ENCOUNTER — Encounter

## 2017-08-04 ENCOUNTER — Ambulatory Visit (INDEPENDENT_AMBULATORY_CARE_PROVIDER_SITE_OTHER): Payer: Medicare Other | Admitting: Cardiovascular Disease

## 2017-08-04 VITALS — BP 126/58 | HR 54 | Ht 64.0 in | Wt 272.0 lb

## 2017-08-04 DIAGNOSIS — I10 Essential (primary) hypertension: Secondary | ICD-10-CM | POA: Diagnosis not present
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
## 2017-08-04 NOTE — Patient Instructions (Signed)
Medication Instructions:  Your physician recommends that you continue on your current medications as directed. Please refer to the Current Medication list given to you today.   Labwork: None Ordered   Testing/Procedures: None Ordered   Follow-Up: Your physician wants you to follow-up in: 6 months with a PA or Nurse Practitioner on Dr. Nahser's team. You will receive a reminder letter in the mail two months in advance. If you don't receive a letter, please call our office to schedule the follow-up appointment.   If you need a refill on your cardiac medications before your next appointment, please call your pharmacy.   Thank you for choosing [facility]! Kairav Russomanno, RN [phone]    

## 2017-08-05 NOTE — Addendum Note (Signed)
Addended by: De Burrs on: 08/05/2017 08:57 AM   Modules accepted: Orders

## 2017-08-11 ENCOUNTER — Other Ambulatory Visit: Payer: Self-pay | Admitting: Cardiovascular Disease

## 2017-08-18 ENCOUNTER — Ambulatory Visit: Payer: Medicare Other | Admitting: Allergy & Immunology

## 2017-08-25 ENCOUNTER — Encounter: Payer: Self-pay | Admitting: Skilled Nursing Facility1

## 2017-08-25 ENCOUNTER — Encounter: Payer: Medicare Other | Attending: Internal Medicine | Admitting: Skilled Nursing Facility1

## 2017-08-25 DIAGNOSIS — Z713 Dietary counseling and surveillance: Secondary | ICD-10-CM | POA: Insufficient documentation

## 2017-08-25 DIAGNOSIS — Z6841 Body Mass Index (BMI) 40.0 and over, adult: Secondary | ICD-10-CM | POA: Insufficient documentation

## 2017-08-25 DIAGNOSIS — E119 Type 2 diabetes mellitus without complications: Secondary | ICD-10-CM | POA: Insufficient documentation

## 2017-08-25 DIAGNOSIS — I10 Essential (primary) hypertension: Secondary | ICD-10-CM | POA: Diagnosis not present

## 2017-08-25 NOTE — Progress Notes (Signed)
  Assessment:  Primary concerns today: for obesity.   Pt states she started gaining weight at the age of 83 years old. Pt state she is about 270 pounds. Pt states she makes her meals. Pt states her A1C is 6.7 and believes her diabetes to be well controlled without medication. Pt states she struggles with bone pain the patient states she has a bowel movement every day. Pt states she wears her C-PAP every night with restful sleep. Pt states her son died of an overdose and states that was really hard for her. Pt states her cholesterol medicine hurts her muscles. Pt states if she eats too close to bed she will have reflux. Pt states she does not like artificial sweeteners. Pt states she wakes up 1 time in the night to use the restroom. Pt states she goes to bed around 11pm-12am and does not want to change that. Pt states her husband is in his 29's and just stopped working stating he does not take any medication and only had a history of colon cancer.  Pt arrives with a cane but does not use it and walks briskly. Pt states she is excited to make some dietary changes.    MEDICATIONS: See List   DIETARY INTAKE:  Usual eating pattern includes 2 meals and 1 snacks per day.  Everyday foods include none stated.  Avoided foods include none stated.    24-hr recall:  B ( 9-9:30AM): grits or fried egg without the yolk or toast with cottage cheese Snk ( AM):   L ( PM): skipped Snk ( PM): peanuts D ( 4 PM): baked chicken or pork loin with cabbage and squash and corn bread or restaurant  Snk ( 9PM): peaches packed in its own juice and cottage cheese or baked potato or cheese crackers  Beverages: sweet tea, soda, water  Usual physical activity: ADL's  Estimated energy needs: 1500 calories 170 g carbohydrates 112 g protein 42 g fat  Progress Towards Goal(s):  In progress.    Intervention:  Nutrition counseling for weight management.  Goals: -When you wake up eat within 1-1.5 hours  -Try FiberOne  cereal -For breakfast have carbohydrate and protein: example: 1 package of grits and 2 egg whites OR peanut butter crackers and 1 glass of milk -For lunch and Dinner: carbohydrate, protein, and vegetable  -Try to only do 1 teaspoon of sugar in your coffee -1 scoop of fruit and 1 scoop of cottage for your snack  -Eat every 3-5 hours  Teaching Method Utilized:  Visual Auditory Hands on  Handouts given during visit include:  Meal ideas  Barriers to learning/adherence to lifestyle change: none identified   Demonstrated degree of understanding via:  Teach Back   Monitoring/Evaluation:  Dietary intake, exercise,and body weight prn.

## 2017-08-25 NOTE — Patient Instructions (Addendum)
-  When you wake up eat within 1-1.5 hours   -Try FiberOne cereal  -For breakfast have carbohydrate and protein: example: 1 package of grits and 2 egg whites OR peanut butter crackers and 1 glass of milk  -For lunch and Dinner: carbohydrate, protein, and vegetable   -Try to only do 1 teaspoon of sugar in your coffee  -1 scoop of fruit and 1 scoop of cottage for your snack   -Eat every 3-5 hours

## 2017-09-14 ENCOUNTER — Ambulatory Visit: Payer: Medicare Other | Admitting: Allergy and Immunology

## 2017-09-28 ENCOUNTER — Encounter: Payer: Self-pay | Admitting: Skilled Nursing Facility1

## 2017-09-28 ENCOUNTER — Encounter: Payer: Medicare Other | Attending: Internal Medicine | Admitting: Skilled Nursing Facility1

## 2017-09-28 DIAGNOSIS — Z6841 Body Mass Index (BMI) 40.0 and over, adult: Secondary | ICD-10-CM | POA: Diagnosis not present

## 2017-09-28 DIAGNOSIS — Z713 Dietary counseling and surveillance: Secondary | ICD-10-CM | POA: Insufficient documentation

## 2017-09-28 DIAGNOSIS — I10 Essential (primary) hypertension: Secondary | ICD-10-CM | POA: Diagnosis not present

## 2017-09-28 DIAGNOSIS — E119 Type 2 diabetes mellitus without complications: Secondary | ICD-10-CM | POA: Diagnosis not present

## 2017-09-28 NOTE — Progress Notes (Signed)
  Assessment:  Primary concerns today: for obesity.  Pt arrives with a cane but does not use it and walks briskly. Pt states she is excited to make some dietary changes.  Pt states her back pain flared up recently so had to take a break from exercise. Pt states she has been watching her portions this last month. Pt states she has been working on her food using the meal ideas sheet And eating less candy.    MEDICATIONS: See List   DIETARY INTAKE:  Usual eating pattern includes 2 meals and 1 snacks per day.  Everyday foods include none stated.  Avoided foods include none stated.    24-hr recall:  B ( 9-9:30AM): grits or fried egg without the yolk or toast with cottage cheese or fast food Snk ( AM):   L ( PM): skipped Snk ( PM): peanuts D ( 4 PM): baked chicken or pork loin with cabbage and squash and corn bread or restaurant  Snk ( 9PM): peaches packed in its own juice and cottage cheese or baked potato or cheese crackers or banana  Beverages: sweet tea, soda, water  Usual physical activity: ADL's  Estimated energy needs: 1500 calories 170 g carbohydrates 112 g protein 42 g fat  Progress Towards Goal(s):  In progress.    Intervention:  Nutrition counseling for weight management.  Goals: -When you wake up eat within 1-1.5 hours  -Try FiberOne cereal -For breakfast have carbohydrate and protein: example: 1 package of grits and 2 egg whites OR peanut butter crackers and 1 glass of milk -For lunch and Dinner: carbohydrate, protein, and vegetable  -Try to only do 1 teaspoon of sugar in your coffee -1 scoop of fruit and 1 scoop of cottage for your snack  -Eat every 3-5 hours  Teaching Method Utilized:  Visual Auditory Hands on  Handouts given during visit include:  Meal ideas  Barriers to learning/adherence to lifestyle change: none identified   Demonstrated degree of understanding via:  Teach Back   Monitoring/Evaluation:  Dietary intake, exercise,and body weight  prn.

## 2017-09-28 NOTE — Patient Instructions (Addendum)
-  Get grilled chicken instead of fried  -stick to one small handful of nuts in a day

## 2017-10-03 ENCOUNTER — Emergency Department (HOSPITAL_COMMUNITY)
Admission: EM | Admit: 2017-10-03 | Discharge: 2017-10-04 | Disposition: A | Discharge status: Home / Self Care | Payer: Medicare Other | Attending: Emergency Medicine | Admitting: Emergency Medicine

## 2017-10-03 ENCOUNTER — Encounter (HOSPITAL_COMMUNITY): Payer: Self-pay | Admitting: Emergency Medicine

## 2017-10-03 ENCOUNTER — Other Ambulatory Visit: Payer: Self-pay

## 2017-10-03 DIAGNOSIS — M25551 Pain in right hip: Secondary | ICD-10-CM | POA: Insufficient documentation

## 2017-10-03 DIAGNOSIS — M545 Low back pain: Secondary | ICD-10-CM | POA: Diagnosis not present

## 2017-10-03 DIAGNOSIS — Z5321 Procedure and treatment not carried out due to patient leaving prior to being seen by health care provider: Secondary | ICD-10-CM | POA: Insufficient documentation

## 2017-10-03 NOTE — ED Triage Notes (Addendum)
Pt c/o low back pain radiating to R hip. Pt states medication at home are not helping now. Pt decline back surgery or further injections. Pt has taken Hydrocodone x 4 today and felt uncomfortable taking more at home

## 2017-10-04 ENCOUNTER — Ambulatory Visit: Payer: Medicare Other | Admitting: Allergy & Immunology

## 2017-10-04 DIAGNOSIS — M5416 Radiculopathy, lumbar region: Secondary | ICD-10-CM | POA: Insufficient documentation

## 2017-10-04 NOTE — ED Notes (Signed)
Pt stated she coould no longer wait with the pain. Left AMA from waiting room

## 2017-12-07 ENCOUNTER — Other Ambulatory Visit: Payer: Self-pay | Admitting: Gynecology

## 2017-12-07 DIAGNOSIS — Z1231 Encounter for screening mammogram for malignant neoplasm of breast: Secondary | ICD-10-CM

## 2017-12-17 ENCOUNTER — Emergency Department (HOSPITAL_COMMUNITY)
Admission: EM | Admit: 2017-12-17 | Discharge: 2017-12-18 | Disposition: A | Discharge status: Home / Self Care | Payer: Medicare Other | Attending: Emergency Medicine | Admitting: Emergency Medicine

## 2017-12-17 DIAGNOSIS — N3000 Acute cystitis without hematuria: Secondary | ICD-10-CM | POA: Insufficient documentation

## 2017-12-17 DIAGNOSIS — J45909 Unspecified asthma, uncomplicated: Secondary | ICD-10-CM | POA: Insufficient documentation

## 2017-12-17 DIAGNOSIS — M549 Dorsalgia, unspecified: Secondary | ICD-10-CM | POA: Insufficient documentation

## 2017-12-17 DIAGNOSIS — Z7982 Long term (current) use of aspirin: Secondary | ICD-10-CM | POA: Diagnosis not present

## 2017-12-17 DIAGNOSIS — Z87891 Personal history of nicotine dependence: Secondary | ICD-10-CM | POA: Insufficient documentation

## 2017-12-17 DIAGNOSIS — E1122 Type 2 diabetes mellitus with diabetic chronic kidney disease: Secondary | ICD-10-CM | POA: Insufficient documentation

## 2017-12-17 DIAGNOSIS — G8929 Other chronic pain: Secondary | ICD-10-CM | POA: Insufficient documentation

## 2017-12-17 DIAGNOSIS — N189 Chronic kidney disease, unspecified: Secondary | ICD-10-CM | POA: Insufficient documentation

## 2017-12-17 DIAGNOSIS — Z79899 Other long term (current) drug therapy: Secondary | ICD-10-CM | POA: Diagnosis not present

## 2017-12-17 DIAGNOSIS — Z923 Personal history of irradiation: Secondary | ICD-10-CM | POA: Diagnosis not present

## 2017-12-17 DIAGNOSIS — Z853 Personal history of malignant neoplasm of breast: Secondary | ICD-10-CM | POA: Diagnosis not present

## 2017-12-17 DIAGNOSIS — I129 Hypertensive chronic kidney disease with stage 1 through stage 4 chronic kidney disease, or unspecified chronic kidney disease: Secondary | ICD-10-CM | POA: Insufficient documentation

## 2017-12-17 NOTE — ED Triage Notes (Addendum)
Patient BIB EMS for low right back pain that began tonight around 9:30pm. This is a chronic issue. Pt has received shots for it in the past but states they caused too much pain so they had to stop.  Vitals BP 181/76 HR 78 SpO2 95% CBG 108

## 2017-12-17 NOTE — ED Notes (Signed)
Bed: [facility] Expected date:  Expected time:  Means of arrival:  Comments: EMS 83 yo female lower right back pain-chronic-no falls 192/73 HR 76 96% RA

## 2017-12-18 ENCOUNTER — Other Ambulatory Visit: Payer: Self-pay

## 2017-12-18 ENCOUNTER — Ambulatory Visit (HOSPITAL_COMMUNITY): Admission: RE | Admit: 2017-12-18 | Payer: Medicare Other | Source: Ambulatory Visit

## 2017-12-18 ENCOUNTER — Encounter (HOSPITAL_COMMUNITY): Payer: Self-pay

## 2017-12-18 ENCOUNTER — Emergency Department (HOSPITAL_COMMUNITY): Payer: Medicare Other

## 2017-12-18 LAB — URINALYSIS, ROUTINE W REFLEX MICROSCOPIC
BACTERIA UA: NONE SEEN
BILIRUBIN URINE: NEGATIVE
GLUCOSE, UA: NEGATIVE mg/dL
Hgb urine dipstick: NEGATIVE
KETONES UR: NEGATIVE mg/dL
NITRITE: NEGATIVE
PROTEIN: NEGATIVE mg/dL
Specific Gravity, Urine: 1.016 (ref 1.005–1.030)
pH: 5 (ref 5.0–8.0)

## 2017-12-18 MED ORDER — KETOROLAC TROMETHAMINE 60 MG/2ML IM SOLN
30.0000 mg | Freq: Once | INTRAMUSCULAR | Status: AC
  Administered 2017-12-18: 30 mg via INTRAMUSCULAR
  Filled 2017-12-18: qty 2

## 2017-12-18 MED ORDER — CEPHALEXIN 500 MG PO CAPS
500.0000 mg | ORAL_CAPSULE | Freq: Once | ORAL | Status: AC
  Administered 2017-12-18: 500 mg via ORAL
  Filled 2017-12-18: qty 1

## 2017-12-18 MED ORDER — CEPHALEXIN 500 MG PO CAPS: 500.0000 mg | ORAL_CAPSULE | Freq: Four times a day (QID) | ORAL | 0 refills | Status: DC

## 2017-12-18 MED ORDER — LIDOCAINE 5 % EX PTCH
2.0000 | MEDICATED_PATCH | CUTANEOUS | Status: DC
  Administered 2017-12-18: 2 via TRANSDERMAL
  Filled 2017-12-18: qty 2

## 2017-12-18 MED ORDER — LIDOCAINE 5 % EX PTCH: 1.0000 | MEDICATED_PATCH | CUTANEOUS | 0 refills | Status: AC

## 2017-12-18 NOTE — ED Notes (Signed)
Patient ambulated to BR and back to room 25 with minimal assistance, using front wheel walker.

## 2017-12-18 NOTE — ED Notes (Signed)
Bed: [facility] Expected date:  Expected time:  Means of arrival:  Comments: [facility] 

## 2017-12-18 NOTE — ED Provider Notes (Signed)
[facility] DEPT Provider Note   CSN: [id number] Arrival date & time: 12/17/17  2349     History   Chief Complaint No chief complaint on file.   HPI Vicki [name] is a 83 y.o. female.  The history is provided by the patient.  Back Pain   This is a chronic problem. The current episode started more than 1 week ago. The problem occurs constantly. The problem has not changed since onset.The pain is associated with no known injury. The pain is present in the sacro-iliac joint and gluteal region. The quality of the pain is described as stabbing. The pain does not radiate. The pain is severe. The symptoms are aggravated by bending, twisting and certain positions. The pain is the same all the time. Pertinent negatives include no chest pain, no fever, no numbness, no weight loss, no headaches, no abdominal pain, no abdominal swelling, no bowel incontinence, no perianal numbness, no bladder incontinence, no dysuria, no pelvic pain, no leg pain, no paresthesias, no paresis, no tingling and no weakness. Treatments tried: narcotics. The treatment provided no relief. Risk factors include obesity.    Past Medical History:  Diagnosis Date  . Arthritis    osteo  . Asthma    none in years  . Bradycardia   . Breast cancer ([place]) 2010   Left  . Chest pain   . Chronic kidney disease    states elevated creatnine- followed by Dr Joylene Draft  . Complication of anesthesia    following tympanoplasty in 1997- "HAD TIGHTENING AROUND CHEST FOLLOWING ANESTHESIA'- states has done well x 2 since  . Diabetes mellitus    borderline/diet controlled  . GERD (gastroesophageal reflux disease)   . Gout   . Hypertension   . Obesity   . Personal history of radiation therapy   . Scoliosis   . Sleep apnea    states setting on 11- followed by Dr Westley Hummer yearly- last study 7 years ago  . Sleep apnea with use of continuous positive airway pressure (CPAP) 01/24/2013    Patient Active  Problem List   Diagnosis Date Noted  . Hypertensive heart disease without heart failure 06/29/2016  . OSA on CPAP 01/22/2014  . Obesity hypoventilation syndrome ([place]) 01/22/2014  . Sleep apnea with use of continuous positive airway pressure (CPAP) 01/24/2013  . Obesity   . Hypertension   . Hx Breast Cancer, IDC, Stage I, left, receptor +, Her 2 + 01/01/2009    Past Surgical History:  Procedure Laterality Date  . BREAST LUMPECTOMY Left 07/2008   Left - Dr Margot Chimes  . CARPAL TUNNEL RELEASE     right  . DILATION AND CURETTAGE OF UTERUS    . SHOULDER OPEN ROTATOR CUFF REPAIR  05/27/2011   Procedure: ROTATOR CUFF REPAIR SHOULDER OPEN;  Surgeon: Tobi Bastos, MD;  Location: [facility] ORS;  Service: Orthopedics;  Laterality: Right;  Right Shoulder Rotator Cuff Repair complex    . TONSILLECTOMY    . TYMPANOPLASTY  1997   right     OB History    Gravida  7   Para  6   Term  6   Preterm      AB  1   Living  6     SAB  1   TAB      Ectopic      Multiple      Live Births               Home  Medications    Prior to Admission medications   Medication Sig Start Date End Date Taking? Authorizing Provider  allopurinol (ZYLOPRIM) 300 MG tablet Take 150 mg by mouth daily.    [provider]  amLODipine (NORVASC) 5 MG tablet Take 5 mg by mouth daily.  08/06/16   [provider]  aspirin 81 MG tablet Take 81 mg by mouth every other day.     [provider]  carvedilol (COREG) 12.5 MG tablet TAKE 1 TABLET BY MOUTH TWICE DAILY WITH A MEAL. 08/11/17   Nahser, Wonda Cheng, MD  chlorthalidone (HYGROTON) 25 MG tablet Take 12.5 mg by mouth daily.    [provider]  Cholecalciferol (VITAMIN D) 1000 UNITS capsule Take 1,000 Units by mouth daily.     [provider]  Coenzyme Q10 (CO Q 10 PO) Take 1 tablet by mouth daily.     [provider]  EPIPEN 2-PAK 0.3 MG/0.3ML DEVI 0.3 mg.  04/23/12   [provider]    HYDROcodone-acetaminophen (NORCO/VICODIN) 5-325 MG tablet Take 1-2 tablets by mouth every 6 (six) hours as needed for moderate pain. 05/29/15   Fredia Sorrow, MD  losartan (COZAAR) 100 MG tablet Take 100 mg by mouth at bedtime.    [provider]  rosuvastatin (CRESTOR) 20 MG tablet Take 10 mg by mouth every evening.     [provider]    Family History Family History  Problem Relation Age of Onset  . Diabetes Mother   . Breast cancer Sister        Age unknown  . Diabetes Sister   . Diabetes Brother   . Breast cancer Maternal Grandmother        Age unknown    Social History Social History   Tobacco Use  . Smoking status: Former Smoker    Last attempt to quit: 04/06/1961    Years since quitting: 56.7  . Smokeless tobacco: Never Used  Substance Use Topics  . Alcohol use: No  . Drug use: No     Allergies   Shellfish allergy; Sulfa antibiotics; Pantoprazole sodium; and Sulfur   Review of Systems Review of Systems  Constitutional: Negative for fever and weight loss.  Respiratory: Negative for cough and shortness of breath.   Cardiovascular: Negative for chest pain, palpitations and leg swelling.  Gastrointestinal: Negative for abdominal pain and bowel incontinence.  Genitourinary: Negative for bladder incontinence, dysuria and pelvic pain.  Musculoskeletal: Positive for back pain. Negative for gait problem, joint swelling, myalgias, neck pain and neck stiffness.  Neurological: Negative for tingling, weakness, numbness, headaches and paresthesias.  All other systems reviewed and are negative.    Physical Exam Updated Vital Signs BP (!) 142/56   Pulse 74   Resp 18   SpO2 96%   Physical Exam  Constitutional: She is oriented to person, place, and time. She appears well-developed and well-nourished. No distress.  HENT:  Head: Normocephalic and atraumatic.  Right Ear: External ear normal.  Left Ear: External ear normal.  Nose: Nose normal.   Mouth/Throat: Oropharynx is clear and moist. No oropharyngeal exudate.  Eyes: Pupils are equal, round, and reactive to light. Conjunctivae are normal.  Neck: Normal range of motion. Neck supple.  Cardiovascular: Normal rate, regular rhythm, normal heart sounds and intact distal pulses.  Pulmonary/Chest: Effort normal and breath sounds normal. No stridor. No respiratory distress. She has no wheezes. She has no rales.  Abdominal: Soft. She exhibits no mass. There is no tenderness. There is  no rebound and no guarding.  Gassy   Musculoskeletal: Normal range of motion. She exhibits no edema, tenderness or deformity.  Neurological: She is alert and oriented to person, place, and time. She displays normal reflexes. She exhibits normal muscle tone. Coordination normal.  Skin: Skin is warm and dry. Capillary refill takes less than 2 seconds.  Psychiatric: She has a normal mood and affect.  Nursing note and vitals reviewed.    ED Treatments / Results  Labs (all labs ordered are listed, but only abnormal results are displayed) Labs Reviewed  URINALYSIS, ROUTINE W REFLEX MICROSCOPIC    EKG None  Radiology Dg Lumbar Spine Complete  Result Date: 12/18/2017 CLINICAL DATA:  Pt c/o low back pain radiating to R hip. Pt states medication at home are not helping now. Pt decline back surgery or further injections. EXAM: LUMBAR SPINE - COMPLETE 4+ VIEW COMPARISON:  09/17/2016 CT FINDINGS: Five lumbar type vertebral bodies. Minimal convex left lumbar spine curvature. Loss of intervertebral disc height at multiple thoracolumbar levels. Facet arthropathy at multiple lumbosacral levels. Maintenance of vertebral body height and alignment. Aortic atherosclerosis. IMPRESSION: Lumbosacral spondylosis, without acute superimposed process. Aortic Atherosclerosis (ICD10-I70.0). Electronically Signed   By: Abigail Miyamoto M.D.   On: 12/18/2017 00:58    Procedures Procedures (including critical care time)  Medications  Ordered in ED Medications  lidocaine (LIDODERM) 5 % 2 patch (has no administration in time range)  ketorolac (TORADOL) injection 30 mg (has no administration in time range)      Final Clinical Impressions(s) / ED Diagnoses   Will treat for UTI, orders for outpatient doppler placed.    Return for fevers >100.4 unrelieved by medication, shortness of breath, intractable vomiting, or diarrhea, Inability to tolerate liquids or food, cough, altered mental status or any concerns. No signs of systemic illness or infection. The patient is nontoxic-appearing on exam and vital signs are within normal limits.   I have reviewed the triage vital signs and the nursing notes. Pertinent labs &imaging results that were available during my care of the patient were reviewed by me and considered in my medical decision making (see chart for details).  After history, exam, and medical workup I feel the patient has been appropriately medically screened and is safe for discharge home. Pertinent diagnoses were discussed with the patient. Patient was given return precautions.   Anina Schnake, MD 12/18/17 [phone]

## 2017-12-22 ENCOUNTER — Encounter: Payer: Self-pay | Admitting: Allergy & Immunology

## 2017-12-22 ENCOUNTER — Ambulatory Visit: Payer: Medicare Other | Admitting: Allergy & Immunology

## 2017-12-22 VITALS — BP 150/70 | HR 51 | Temp 98.1°F | Resp 18 | Ht 64.0 in | Wt 269.2 lb

## 2017-12-22 DIAGNOSIS — T7840XD Allergy, unspecified, subsequent encounter: Secondary | ICD-10-CM | POA: Diagnosis not present

## 2017-12-22 DIAGNOSIS — J310 Chronic rhinitis: Secondary | ICD-10-CM

## 2017-12-22 NOTE — Progress Notes (Signed)
NEW PATIENT  Date of Service/Encounter:  12/22/17  Referring provider: Crist Infante, MD   Assessment:   Allergic reaction (shellfish)  Chronic rhinitis  Plan/Recommendations:   1. Allergic reaction - ? shellfish - We will get testing to look for a shellfish allergy. - We will call you in 1-2 weeks with the results of the testing.  - We will hold off on refilling the EpiPen until we find out for sure.  - If the testing for the shellfish is negative, we will bring you in for a shellfish challenge in the office (make an appointment on your way out just to get on the schedule).   2. Chronic rhinitis - We will get an allergy panel via your blood.  - We will call you in 1-2 weeks with the results of the testing.   3. Return in about 3 months (around 03/23/2018) for [place].  Subjective:   Ling NALIAH EDDINGTON is a 83 y.o. female presenting today for evaluation of  Chief Complaint  Patient presents with  . Establish Care  . Allergic Reaction    Possible shrimp/ prednisone    Phillipa KATHERYN CULLITON has a history of the following: Patient Active Problem List   Diagnosis Date Noted  . Hypertensive heart disease without heart failure 06/29/2016  . OSA on CPAP 01/22/2014  . Obesity hypoventilation syndrome ([place]) 01/22/2014  . Sleep apnea with use of continuous positive airway pressure (CPAP) 01/24/2013  . Obesity   . Hypertension   . Hx Breast Cancer, IDC, Stage I, left, receptor +, Her 2 + 01/01/2009    History obtained from: chart review and patient.  Ellinore CHERISE FEDDER was referred by Crist Infante, MD.     Rilea is a 83 y.o. female presenting for an evaluation of possible shellfish allergy. She reports that in March of 2016 she had an allergic reaction. She had finished a 7 day course of prednisone on a Thursday, went to [employer] and had shrimp scampi on that Friday, went to bed and woke up on Saturday with red cheeks, swollen lips and a swollen face. She went to the  urgent care at that time and was told that she had an allergic reaction to the shrimp, she was given an EpiPen if she needed it and told to take benadryl. She reports that by the time she got home a few hours later the swelling had improved. She denies any breathing difficulties, rashes on her body, itchiness, or diarrhea. She has avoided any shellfish since that time, including shrimp, lobster and other shellfish. She reports that she has still been eating other fishes such as tuna and salmon with no issues. She has not had any reactions like this since that time. Her EpiPen is expired and she wants to know if she still has an allergy to the shrimp and if she needs to get a new one.   She has used prednisone since this episode, recently got a course in July 2019 for her back condition. She reports no issues with taking this. She did have a steroid injection for some back pain without a problem. She then had a round of prednisone withou a problem. This was in July 2019.    She does have seasonal allergies. She reports they occur in the spring and she will get runny noses, congestion, sneezing, and cough. She reports that she uses a nasal spray at that time and feels like it's manageable.   Of note she went to  the ER on Friday night. She reports that she started feeling bad, had some shortness of breath and started getting cold and shaking a bit when she was laying down. She was told that she had a UTI at that time and was given Cephalexin to take.   Otherwise, there is no history of other atopic diseases, including asthma, environmental allergies, stinging insect allergies, eczema or urticaria. There is no significant infectious history. Vaccinations are up to date.    Past Medical History: Patient Active Problem List   Diagnosis Date Noted  . Hypertensive heart disease without heart failure 06/29/2016  . OSA on CPAP 01/22/2014  . Obesity hypoventilation syndrome ([place]) 01/22/2014  . Sleep apnea  with use of continuous positive airway pressure (CPAP) 01/24/2013  . Obesity   . Hypertension   . Hx Breast Cancer, IDC, Stage I, left, receptor +, Her 2 + 01/01/2009    Medication List:  Allergies as of 12/22/2017      Reactions   Shellfish Allergy Swelling   Sulfa Antibiotics Hives   Pantoprazole Sodium Palpitations   Sulfur Swelling, Rash      Medication List        Accurate as of 12/22/17  8:28 PM. Always use your most recent med list.          allopurinol 300 MG tablet Commonly known as:  ZYLOPRIM Take 150 mg by mouth daily.   amLODipine 5 MG tablet Commonly known as:  NORVASC Take 5 mg by mouth daily.   aspirin 81 MG tablet Take 81 mg by mouth every other day.   carvedilol 12.5 MG tablet Commonly known as:  COREG TAKE 1 TABLET BY MOUTH TWICE DAILY WITH A MEAL.   cephALEXin 500 MG capsule Commonly known as:  KEFLEX Take 1 capsule (500 mg total) by mouth 4 (four) times daily.   chlorthalidone 25 MG tablet Commonly known as:  HYGROTON Take 12.5 mg by mouth daily.   CO Q 10 PO Take 1 tablet by mouth daily.   EPIPEN 2-PAK 0.3 mg/0.3 mL Soaj injection Generic drug:  EPINEPHrine 0.3 mg.   HYDROcodone-acetaminophen 5-325 MG tablet Commonly known as:  NORCO/VICODIN Take 1-2 tablets by mouth every 6 (six) hours as needed for moderate pain.   lidocaine 5 % Commonly known as:  LIDODERM Place 1 patch onto the skin daily. Remove & Discard patch within 12 hours or as directed by MD   losartan 100 MG tablet Commonly known as:  COZAAR Take 100 mg by mouth at bedtime.   rosuvastatin 20 MG tablet Commonly known as:  CRESTOR Take 10 mg by mouth every evening.   Vitamin D 1000 units capsule Take 1,000 Units by mouth daily.       Birth History: non-contributory  Developmental History: non-contributory.   Past Surgical History: Past Surgical History:  Procedure Laterality Date  . BREAST LUMPECTOMY Left 07/2008   Left - Dr Margot Chimes  . CARPAL TUNNEL  RELEASE     right  . DILATION AND CURETTAGE OF UTERUS    . SHOULDER OPEN ROTATOR CUFF REPAIR  05/27/2011   Procedure: ROTATOR CUFF REPAIR SHOULDER OPEN;  Surgeon: Tobi Bastos, MD;  Location: [facility];  Service: Orthopedics;  Laterality: Right;  Right Shoulder Rotator Cuff Repair complex    . TONSILLECTOMY    . TYMPANOPLASTY  1997   right     Family History: Family History  Problem Relation Age of Onset  . Diabetes Mother   . Breast cancer Sister  Age unknown  . Diabetes Sister   . Diabetes Brother   . Breast cancer Maternal Grandmother        Age unknown     Social History: Eppie lives at home with her husband of 28 years. They live in an apartment with tile throughout the home. They have electric heating and central cooling. There are no animals inside or outside of the home. There are no dust mite coverings on the bedding. There is no tobacco exposure in the home. She is retired.     Review of Systems: a 14-point review of systems is pertinent for what is mentioned in HPI.  Otherwise, all other systems were negative. Constitutional: negative other than that listed in the HPI Eyes: negative other than that listed in the HPI Ears, nose, mouth, throat, and face: negative other than that listed in the HPI Respiratory: negative other than that listed in the HPI Cardiovascular: negative other than that listed in the HPI Gastrointestinal: negative other than that listed in the HPI Genitourinary: negative other than that listed in the HPI Integument: negative other than that listed in the HPI Hematologic: negative other than that listed in the HPI Musculoskeletal: negative other than that listed in the HPI Neurological: negative other than that listed in the HPI Allergy/Immunologic: negative other than that listed in the HPI    Objective:   Blood pressure (!) 150/70, pulse (!) 51, temperature 98.1 F (36.7 C), temperature source Oral, resp. rate 18, height 5\' 4"   (1.626 m), weight 269 lb 3.2 oz (122.1 kg), SpO2 97 %. Body mass index is 46.21 kg/m.   Physical Exam:  General: Alert, interactive, in no acute distress. Obese. Pleasant.  Eyes: No conjunctival injection bilaterally, no discharge on the right, no discharge on the left and no Horner-Trantas dots present. PERRL bilaterally. EOMI without pain. No photophobia.  Ears: Right TM pearly gray with normal light reflex, Left TM pearly gray with normal light reflex, Right TM intact without perforation and Left TM intact without perforation.  Nose/Throat: External nose within normal limits and septum midline. Turbinates edematous without discharge. Posterior oropharynx mildly erythematous without cobblestoning in the posterior oropharynx. Tonsils 2+ without exudates.  Tongue without thrush. Neck: Supple without thyromegaly. Trachea midline. Adenopathy: no enlarged lymph nodes appreciated in the anterior cervical, occipital, axillary, epitrochlear, inguinal, or popliteal regions. Lungs: Clear to auscultation without wheezing, rhonchi or rales. No increased work of breathing. CV: Normal S1/S2. No murmurs. Capillary refill <2 seconds.  Abdomen: Nondistended, nontender. No guarding or rebound tenderness. Bowel sounds faint and present in all fields  Skin: Warm and dry, without lesions or rashes. Extremities:  No clubbing, cyanosis or edema. Neuro:   Grossly intact. No focal deficits appreciated. Responsive to questions.  Diagnostic studies: none       Salvatore Marvel, MD Allergy and [place] [facility] [place]

## 2017-12-22 NOTE — Patient Instructions (Addendum)
1. Allergic reaction - ? shellfish - We will get testing to look for a shellfish allergy. - We will call you in 1-2 weeks with the results of the testing.  - We will hold off on refilling the EpiPen until we find out for sure.  - If the testing for the shellfish is negative, we will bring you in for a shellfish challenge in the office (make an appointment on your way out just to get on the schedule).   2. Chronic rhinitis - We will get an allergy panel via your blood.  - We will call you in 1-2 weeks with the results of the testing.   3. Return in about 3 months (around 03/23/2018) for [place].   Please inform us of any Emergency Department visits, hospitalizations, or changes in symptoms. Call us before going to the ED for breathing or allergy symptoms since we might be able to fit you in for a sick visit. Feel free to contact us anytime with any questions, problems, or concerns.  It was a pleasure to meet you today!     Websites that have reliable patient information: 1. American Academy of Asthma, Allergy, and Immunology: www.aaaai.org 2. Food Allergy Research and Education (FARE): foodallergy.org 3. Mothers of Asthmatics: http://www.asthmacommunitynetwork.org 4. American College of Allergy, Asthma, and Immunology: MonthlyElectricBill.co.uk   Make sure you are registered to vote! If you have moved or changed any of your contact information, you will need to get this updated before voting!

## 2017-12-25 LAB — IGE+ALLERGENS ZONE 2(30)
Cockroach, American IgE: 0.66 kU/L — AB
Common Silver Birch IgE: <0.1 kU/L
D Pteronyssinus IgE: 0.79 kU/L — AB
D002-IGE D FARINAE: 0.7 kU/L — AB
Hickory, White IgE: <0.1 kU/L
IgE (Immunoglobulin E), Serum: 113 IU/mL (ref 6–495)
Maple/Box Elder IgE: <0.1 kU/L
Mucor Racemosus IgE: <0.1 kU/L
Nettle IgE: <0.1 kU/L
Oak, White IgE: <0.1 kU/L
Penicillium Chrysogen IgE: <0.1 kU/L
Plantain, English IgE: <0.1 kU/L
Sheep Sorrel IgE Qn: <0.1 kU/L
W001-IGE RAGWEED, SHORT: 0.38 kU/L — AB

## 2017-12-25 LAB — ALLERGEN PROFILE, SHELLFISH
Clam IgE: <0.1 kU/L
F023-IgE Crab: 0.59 kU/L — AB
F080-IGE LOBSTER: 0.33 kU/L — AB
F290-IgE Oyster: <0.1 kU/L
SHRIMP IGE: 0.67 kU/L — AB
Scallop IgE: <0.1 kU/L

## 2017-12-28 ENCOUNTER — Ambulatory Visit: Payer: Medicare Other | Admitting: Skilled Nursing Facility1

## 2018-01-03 ENCOUNTER — Ambulatory Visit
Admission: RE | Admit: 2018-01-03 | Discharge: 2018-01-03 | Disposition: A | Discharge status: Home / Self Care | Payer: Medicare Other | Source: Ambulatory Visit | Attending: Gynecology | Admitting: Gynecology

## 2018-01-03 ENCOUNTER — Ambulatory Visit: Payer: Medicare Other

## 2018-01-03 DIAGNOSIS — Z1231 Encounter for screening mammogram for malignant neoplasm of breast: Secondary | ICD-10-CM

## 2018-01-18 ENCOUNTER — Encounter: Payer: Self-pay | Admitting: Cardiology

## 2018-02-07 ENCOUNTER — Encounter: Payer: Self-pay | Admitting: Cardiology

## 2018-02-07 ENCOUNTER — Ambulatory Visit (INDEPENDENT_AMBULATORY_CARE_PROVIDER_SITE_OTHER): Payer: Medicare Other | Admitting: Cardiology

## 2018-02-07 VITALS — BP 140/58 | HR 58 | Ht 64.0 in | Wt 269.1 lb

## 2018-02-07 DIAGNOSIS — I10 Essential (primary) hypertension: Secondary | ICD-10-CM | POA: Diagnosis not present

## 2018-02-07 DIAGNOSIS — G4733 Obstructive sleep apnea (adult) (pediatric): Secondary | ICD-10-CM

## 2018-02-07 DIAGNOSIS — E785 Hyperlipidemia, unspecified: Secondary | ICD-10-CM | POA: Diagnosis not present

## 2018-02-07 DIAGNOSIS — Z6841 Body Mass Index (BMI) 40.0 and over, adult: Secondary | ICD-10-CM

## 2018-02-07 DIAGNOSIS — Z9989 Dependence on other enabling machines and devices: Secondary | ICD-10-CM

## 2018-02-07 DIAGNOSIS — N184 Chronic kidney disease, stage 4 (severe): Secondary | ICD-10-CM | POA: Diagnosis not present

## 2018-02-07 NOTE — Patient Instructions (Signed)
Medication Instructions:  .[email or web address]  If you need a refill on your cardiac medications before your next appointment, please call your pharmacy.   Lab work: None ordered  If you have labs (blood work) drawn today and your tests are completely normal, you will receive your results only by: Marland Kitchen MyChart Message (if you have MyChart) OR . A paper copy in the mail If you have any lab test that is abnormal or we need to change your treatment, we will call you to review the results.  Testing/Procedures: None ordered  Follow-Up: At [facility], you and your health needs are our priority.  As part of our continuing mission to provide you with exceptional heart care, we have created designated Provider Care Teams.  These Care Teams include your primary Cardiologist (physician) and Advanced Practice Providers (APPs -  Physician Assistants and Nurse Practitioners) who all work together to provide you with the care you need, when you need it. You will need a follow up appointment in:  6 months.  Please call our office 2 months in advance to schedule this appointment.  You may see Mertie Moores, MD or one of the following Advanced Practice Providers on your designated Care Team: Richardson Dopp, PA-C [place], [place] . Daune Perch, NP  Any Other Special Instructions Will Be Listed Below (If Applicable).  DASH Eating Plan DASH stands for "Dietary Approaches to Stop Hypertension." The DASH eating plan is a healthy eating plan that has been shown to reduce high blood pressure (hypertension). It may also reduce your risk for type 2 diabetes, heart disease, and stroke. The DASH eating plan may also help with weight loss. What are tips for following this plan? General guidelines  Avoid eating more than 2,300 mg (milligrams) of salt (sodium) a day. If you have hypertension, you may need to reduce your sodium intake to 1,500 mg a day.  Limit alcohol intake to no more than 1 drink a day for nonpregnant women  and 2 drinks a day for men. One drink equals 12 oz of beer, 5 oz of wine, or 1 oz of hard liquor.  Work with your health care provider to maintain a healthy body weight or to lose weight. Ask what an ideal weight is for you.  Get at least 30 minutes of exercise that causes your heart to beat faster (aerobic exercise) most days of the week. Activities may include walking, swimming, or biking.  Work with your health care provider or diet and nutrition specialist (dietitian) to adjust your eating plan to your individual calorie needs. Reading food labels  Check food labels for the amount of sodium per serving. Choose foods with less than 5 percent of the Daily Value of sodium. Generally, foods with less than 300 mg of sodium per serving fit into this eating plan.  To find whole grains, look for the word "whole" as the first word in the ingredient list. Shopping  Buy products labeled as "low-sodium" or "no salt added."  Buy fresh foods. Avoid canned foods and premade or frozen meals. Cooking  Avoid adding salt when cooking. Use salt-free seasonings or herbs instead of table salt or sea salt. Check with your health care provider or pharmacist before using salt substitutes.  Do not fry foods. Cook foods using healthy methods such as baking, boiling, grilling, and broiling instead.  Cook with heart-healthy oils, such as olive, canola, soybean, or sunflower oil. Meal planning   Eat a balanced diet that includes: ? 5 or more  servings of fruits and vegetables each day. At each meal, try to fill half of your plate with fruits and vegetables. ? Up to 6-8 servings of whole grains each day. ? Less than 6 oz of lean meat, poultry, or fish each day. A 3-oz serving of meat is about the same size as a deck of cards. One egg equals 1 oz. ? 2 servings of low-fat dairy each day. ? A serving of nuts, seeds, or beans 5 times each week. ? Heart-healthy fats. Healthy fats called Omega-3 fatty acids are  found in foods such as flaxseeds and coldwater fish, like sardines, salmon, and mackerel.  Limit how much you eat of the following: ? Canned or prepackaged foods. ? Food that is high in trans fat, such as fried foods. ? Food that is high in saturated fat, such as fatty meat. ? Sweets, desserts, sugary drinks, and other foods with added sugar. ? Full-fat dairy products.  Do not salt foods before eating.  Try to eat at least 2 vegetarian meals each week.  Eat more home-cooked food and less restaurant, buffet, and fast food.  When eating at a restaurant, ask that your food be prepared with less salt or no salt, if possible. What foods are recommended? The items listed may not be a complete list. Talk with your dietitian about what dietary choices are best for you. Grains Whole-grain or whole-wheat bread. Whole-grain or whole-wheat pasta. Brown rice. [place]. Bulgur. Whole-grain and low-sodium cereals. Pita bread. Low-fat, low-sodium crackers. Whole-wheat flour tortillas. Vegetables Fresh or frozen vegetables (raw, steamed, roasted, or grilled). Low-sodium or reduced-sodium tomato and vegetable juice. Low-sodium or reduced-sodium tomato sauce and tomato paste. Low-sodium or reduced-sodium canned vegetables. Fruits All fresh, dried, or frozen fruit. Canned fruit in natural juice (without added sugar). Meat and other protein foods Skinless chicken or [place]. Ground chicken or [place]. Pork with fat trimmed off. Fish and seafood. Egg whites. Dried beans, peas, or lentils. Unsalted nuts, nut butters, and seeds. Unsalted canned beans. Lean cuts of beef with fat trimmed off. Low-sodium, lean deli meat. Dairy Low-fat (1%) or fat-free (skim) milk. Fat-free, low-fat, or reduced-fat cheeses. Nonfat, low-sodium ricotta or cottage cheese. Low-fat or nonfat yogurt. Low-fat, low-sodium cheese. Fats and oils Soft margarine without trans fats. Vegetable oil. Low-fat, reduced-fat, or light mayonnaise  and salad dressings (reduced-sodium). Canola, safflower, olive, soybean, and sunflower oils. Avocado. Seasoning and other foods Herbs. Spices. Seasoning mixes without salt. Unsalted popcorn and pretzels. Fat-free sweets. What foods are not recommended? The items listed may not be a complete list. Talk with your dietitian about what dietary choices are best for you. Grains Baked goods made with fat, such as croissants, muffins, or some breads. Dry pasta or rice meal packs. Vegetables Creamed or fried vegetables. Vegetables in a cheese sauce. Regular canned vegetables (not low-sodium or reduced-sodium). Regular canned tomato sauce and paste (not low-sodium or reduced-sodium). Regular tomato and vegetable juice (not low-sodium or reduced-sodium). Angie Fava. Olives. Fruits Canned fruit in a light or heavy syrup. Fried fruit. Fruit in cream or butter sauce. Meat and other protein foods Fatty cuts of meat. Ribs. Fried meat. Berniece Salines. Sausage. Bologna and other processed lunch meats. Salami. Fatback. Hotdogs. Bratwurst. Salted nuts and seeds. Canned beans with added salt. Canned or smoked fish. Whole eggs or egg yolks. Chicken or [place] with skin. Dairy Whole or 2% milk, cream, and half-and-half. Whole or full-fat cream cheese. Whole-fat or sweetened yogurt. Full-fat cheese. Nondairy creamers. Whipped toppings. Processed cheese and cheese  spreads. Fats and oils Butter. Stick margarine. Lard. Shortening. Ghee. Bacon fat. Tropical oils, such as coconut, palm kernel, or palm oil. Seasoning and other foods Salted popcorn and pretzels. Onion salt, garlic salt, seasoned salt, table salt, and sea salt. Worcestershire sauce. Tartar sauce. Barbecue sauce. Teriyaki sauce. Soy sauce, including reduced-sodium. Steak sauce. Canned and packaged gravies. Fish sauce. Oyster sauce. Cocktail sauce. Horseradish that you find on the shelf. Ketchup. Mustard. Meat flavorings and tenderizers. Bouillon cubes. Hot sauce and Tabasco  sauce. Premade or packaged marinades. Premade or packaged taco seasonings. Relishes. Regular salad dressings. Where to find more information:  National Heart, Lung, and [place]: https://wilson-eaton.com/  American Heart Association: www.heart.org Summary  The DASH eating plan is a healthy eating plan that has been shown to reduce high blood pressure (hypertension). It may also reduce your risk for type 2 diabetes, heart disease, and stroke.  With the DASH eating plan, you should limit salt (sodium) intake to 2,300 mg a day. If you have hypertension, you may need to reduce your sodium intake to 1,500 mg a day.  When on the DASH eating plan, aim to eat more fresh fruits and vegetables, whole grains, lean proteins, low-fat dairy, and heart-healthy fats.  Work with your health care provider or diet and nutrition specialist (dietitian) to adjust your eating plan to your individual calorie needs. This information is not intended to replace advice given to you by your health care provider. Make sure you discuss any questions you have with your health care provider. Document Released: 03/12/2011 Document Revised: 03/16/2016 Document Reviewed: 03/16/2016 Elsevier Interactive Patient Education  [employer].

## 2018-02-07 NOTE — Progress Notes (Signed)
Cardiology Office Note:    Date:  02/07/2018   ID:  Vicki [name], DOB 1934-09-04, MRN [id number]  PCP:  Crist Infante, MD  Cardiologist:  Mertie Moores, MD  Referring MD: Crist Infante, MD   Chief Complaint  Patient presents with  . Follow-up    History of Present Illness:    Vicki [name] is a 83 y.o. female with a past medical history significant for hypertension, sleep apnea on CPAP, GERD, DM, breast cancer 2010, obesity.  He was last seen by Dr Acie Fredrickson 08/04/2017 at which time she was noted to be doing well, some leg swelling.  Was not taking Lasix and had been started on chlorthalidone with a dose reduction from 25 mg to 12.5 mg due to orthostasis.  She is here today for 6 month follow up. Her activity is limited due to back pain, using a cane. She also had surgery on her rotator cuff bilaterally but still has limited arm movement. She uses a Corporate investment banker at home and does not stand for very long. No chest pain/pressure/tightness. She has DOE that has been present for several years and not acutely worse. She notes that the less active she gets the more short of breath she is when she does try to be active. She gets occ palpitations when she drinks caffeine, rarely. No lightheadedness or dizziness. She has occasional lower extremity edema the resolves over night.   Past Medical History:  Diagnosis Date  . Angio-edema   . Arthritis    osteo  . Asthma    none in years  . Bradycardia   . Breast cancer ([place]) 2010   Left  . Chest pain   . Chronic kidney disease    states elevated creatnine- followed by Dr Joylene Draft  . Complication of anesthesia    following tympanoplasty in 1997- "HAD TIGHTENING AROUND CHEST FOLLOWING ANESTHESIA'- states has done well x 2 since  . Diabetes mellitus    borderline/diet controlled  . GERD (gastroesophageal reflux disease)   . Gout   . Hypertension   . Obesity   . Personal history of radiation therapy   . Scoliosis   . Sleep apnea    states setting  on 11- followed by Dr Westley Hummer yearly- last study 7 years ago  . Sleep apnea with use of continuous positive airway pressure (CPAP) 01/24/2013  . Urticaria     Past Surgical History:  Procedure Laterality Date  . BREAST LUMPECTOMY Left 07/2008   Left - Dr Margot Chimes  . CARPAL TUNNEL RELEASE     right  . DILATION AND CURETTAGE OF UTERUS    . SHOULDER OPEN ROTATOR CUFF REPAIR  05/27/2011   Procedure: ROTATOR CUFF REPAIR SHOULDER OPEN;  Surgeon: Tobi Bastos, MD;  Location: [facility];  Service: Orthopedics;  Laterality: Right;  Right Shoulder Rotator Cuff Repair complex    . TONSILLECTOMY    . TYMPANOPLASTY  1997   right    Current Medications: Current Meds  Medication Sig  . allopurinol (ZYLOPRIM) 300 MG tablet Take 150 mg by mouth daily.  Marland Kitchen amLODipine (NORVASC) 5 MG tablet Take 5 mg by mouth daily.   Marland Kitchen aspirin 81 MG tablet Take 81 mg by mouth every other day.   . carvedilol (COREG) 12.5 MG tablet TAKE 1 TABLET BY MOUTH TWICE DAILY WITH A MEAL.  . cephALEXin (KEFLEX) 500 MG capsule Take 1 capsule (500 mg total) by mouth 4 (four) times daily.  . chlorthalidone (HYGROTON) 25 MG tablet Take 12.5  mg by mouth daily.  . Cholecalciferol (VITAMIN D) 1000 UNITS capsule Take 1,000 Units by mouth daily.   . Coenzyme Q10 (CO Q 10 PO) Take 1 tablet by mouth daily.   Marland Kitchen EPIPEN 2-PAK 0.3 MG/0.3ML DEVI 0.3 mg.   . HYDROcodone-acetaminophen (NORCO/VICODIN) 5-325 MG tablet Take 1-2 tablets by mouth every 6 (six) hours as needed for moderate pain.  Marland Kitchen lidocaine (LIDODERM) 5 % Place 1 patch onto the skin daily. Remove & Discard patch within 12 hours or as directed by MD  . losartan (COZAAR) 100 MG tablet Take 100 mg by mouth at bedtime.  . rosuvastatin (CRESTOR) 10 MG tablet Take 10 mg by mouth daily.     Allergies:   Shellfish allergy; Sulfa antibiotics; Pantoprazole sodium; and Sulfur   Social History   Socioeconomic History  . Marital status: Married    Spouse name: Not on file  . Number of  children: Not on file  . Years of education: Not on file  . Highest education level: Not on file  Occupational History  . Not on file  Social Needs  . Financial resource strain: Not on file  . Food insecurity:    Worry: Not on file    Inability: Not on file  . Transportation needs:    Medical: Not on file    Non-medical: Not on file  Tobacco Use  . Smoking status: Former Smoker    Last attempt to quit: 04/06/1961    Years since quitting: 56.8  . Smokeless tobacco: Never Used  Substance and Sexual Activity  . Alcohol use: No  . Drug use: No  . Sexual activity: Yes    Birth control/protection: Post-menopausal    Comment: 1st intercourse 36 yo-5 partners  Lifestyle  . Physical activity:    Days per week: Not on file    Minutes per session: Not on file  . Stress: Not on file  Relationships  . Social connections:    Talks on phone: Not on file    Gets together: Not on file    Attends religious service: Not on file    Active member of club or organization: Not on file    Attends meetings of clubs or organizations: Not on file    Relationship status: Not on file  Other Topics Concern  . Not on file  Social History Narrative   Right handed, Married, 6 kids, Caffeine 1 cup daily, Retired,  HS grad,      Family History: The patient's family history includes Breast cancer in her maternal grandmother and sister; Diabetes in her brother, mother, and sister. ROS:   Please see the history of present illness.     All other systems reviewed and are negative.  EKGs/Labs/Other Studies Reviewed:     EKG:  EKG is  ordered today.    Recent Labs: No results found for requested labs within last 8760 hours.   Recent Lipid Panel No results found for: CHOL, TRIG, HDL, CHOLHDL, VLDL, LDLCALC, LDLDIRECT  Physical Exam:    VS:  BP (!) 140/58   Pulse (!) 58   Ht 5\' 4"  (1.626 m)   Wt 269 lb 1.9 oz (122.1 kg)   SpO2 97%   BMI 46.19 kg/m     Wt Readings from Last 3 Encounters:    02/07/18 269 lb 1.9 oz (122.1 kg)  12/22/17 269 lb 3.2 oz (122.1 kg)  10/03/17 270 lb (122.5 kg)     Physical Exam  Constitutional: She is oriented  to person, place, and time. She appears well-developed and well-nourished. No distress.  HENT:  Head: Normocephalic and atraumatic.  Neck: Normal range of motion. Neck supple. No JVD present.  Cardiovascular: Normal rate, regular rhythm, normal heart sounds and intact distal pulses. Exam reveals no gallop and no friction rub.  No murmur heard. Pulmonary/Chest: Effort normal and breath sounds normal. No respiratory distress. She has no wheezes. She has no rales.  Abdominal: Soft. Bowel sounds are normal.  Musculoskeletal: Normal range of motion. She exhibits no edema.  Neurological: She is alert and oriented to person, place, and time.  Skin: Skin is warm and dry.  Psychiatric: She has a normal mood and affect. Her behavior is normal. Judgment and thought content normal.  Vitals reviewed.    ASSESSMENT:    1. Essential hypertension   2. OSA on CPAP   3. Hyperlipidemia LDL goal <100   4. CKD (chronic kidney disease) stage 4, GFR 15-29 ml/min (HCC)   5. Class 3 severe obesity due to excess calories with body mass index (BMI) of 45.0 to 49.9 in adult, unspecified whether serious comorbidity present ([place])    PLAN:    In order of problems listed above:  Hypertension: Management with carvedilol, chlorthalidone, losartan, amlodipine. BP well controlled, has not taken her meds yet today. Continue current therapy  Leg edema: well controlled.   Obstructive sleep apnea: On CPAP- reports compliance  Hyperlipidemia: On Crestor 10 mg daily. Followed at PCP. Well controlled with LDL 97 in 12/2017.  CKD stage 4 -SCr 1.6 in 12/2017. Followed by PCP.   Obesity: Body mass index is 46.19 kg/m. Advised on weight loss with calorie restriction and good food choices. DASH diet info given.   Medication Adjustments/Labs and Tests Ordered: Current  medicines are reviewed at length with the patient today.  Concerns regarding medicines are outlined above. Labs and tests ordered and medication changes are outlined in the patient instructions below:  Patient Instructions  Medication Instructions:  .[email or web address]  If you need a refill on your cardiac medications before your next appointment, please call your pharmacy.   Lab work: None ordered  If you have labs (blood work) drawn today and your tests are completely normal, you will receive your results only by: Marland Kitchen MyChart Message (if you have MyChart) OR . A paper copy in the mail If you have any lab test that is abnormal or we need to change your treatment, we will call you to review the results.  Testing/Procedures: None ordered  Follow-Up: At [facility], you and your health needs are our priority.  As part of our continuing mission to provide you with exceptional heart care, we have created designated Provider Care Teams.  These Care Teams include your primary Cardiologist (physician) and Advanced Practice Providers (APPs -  Physician Assistants and Nurse Practitioners) who all work together to provide you with the care you need, when you need it. You will need a follow up appointment in:  6 months.  Please call our office 2 months in advance to schedule this appointment.  You may see Mertie Moores, MD or one of the following Advanced Practice Providers on your designated Care Team: Richardson Dopp, PA-C [place], [place] . Daune Perch, NP  Any Other Special Instructions Will Be Listed Below (If Applicable).  DASH Eating Plan DASH stands for "Dietary Approaches to Stop Hypertension." The DASH eating plan is a healthy eating plan that has been shown to reduce high blood pressure (hypertension). It may also  reduce your risk for type 2 diabetes, heart disease, and stroke. The DASH eating plan may also help with weight loss. What are tips for following this plan? General guidelines  Avoid  eating more than 2,300 mg (milligrams) of salt (sodium) a day. If you have hypertension, you may need to reduce your sodium intake to 1,500 mg a day.  Limit alcohol intake to no more than 1 drink a day for nonpregnant women and 2 drinks a day for men. One drink equals 12 oz of beer, 5 oz of wine, or 1 oz of hard liquor.  Work with your health care provider to maintain a healthy body weight or to lose weight. Ask what an ideal weight is for you.  Get at least 30 minutes of exercise that causes your heart to beat faster (aerobic exercise) most days of the week. Activities may include walking, swimming, or biking.  Work with your health care provider or diet and nutrition specialist (dietitian) to adjust your eating plan to your individual calorie needs. Reading food labels  Check food labels for the amount of sodium per serving. Choose foods with less than 5 percent of the Daily Value of sodium. Generally, foods with less than 300 mg of sodium per serving fit into this eating plan.  To find whole grains, look for the word "whole" as the first word in the ingredient list. Shopping  Buy products labeled as "low-sodium" or "no salt added."  Buy fresh foods. Avoid canned foods and premade or frozen meals. Cooking  Avoid adding salt when cooking. Use salt-free seasonings or herbs instead of table salt or sea salt. Check with your health care provider or pharmacist before using salt substitutes.  Do not fry foods. Cook foods using healthy methods such as baking, boiling, grilling, and broiling instead.  Cook with heart-healthy oils, such as olive, canola, soybean, or sunflower oil. Meal planning   Eat a balanced diet that includes: ? 5 or more servings of fruits and vegetables each day. At each meal, try to fill half of your plate with fruits and vegetables. ? Up to 6-8 servings of whole grains each day. ? Less than 6 oz of lean meat, poultry, or fish each day. A 3-oz serving of meat is  about the same size as a deck of cards. One egg equals 1 oz. ? 2 servings of low-fat dairy each day. ? A serving of nuts, seeds, or beans 5 times each week. ? Heart-healthy fats. Healthy fats called Omega-3 fatty acids are found in foods such as flaxseeds and coldwater fish, like sardines, salmon, and mackerel.  Limit how much you eat of the following: ? Canned or prepackaged foods. ? Food that is high in trans fat, such as fried foods. ? Food that is high in saturated fat, such as fatty meat. ? Sweets, desserts, sugary drinks, and other foods with added sugar. ? Full-fat dairy products.  Do not salt foods before eating.  Try to eat at least 2 vegetarian meals each week.  Eat more home-cooked food and less restaurant, buffet, and fast food.  When eating at a restaurant, ask that your food be prepared with less salt or no salt, if possible. What foods are recommended? The items listed may not be a complete list. Talk with your dietitian about what dietary choices are best for you. Grains Whole-grain or whole-wheat bread. Whole-grain or whole-wheat pasta. Brown rice. [place]. Bulgur. Whole-grain and low-sodium cereals. Pita bread. Low-fat, low-sodium crackers. Whole-wheat flour tortillas.  Vegetables Fresh or frozen vegetables (raw, steamed, roasted, or grilled). Low-sodium or reduced-sodium tomato and vegetable juice. Low-sodium or reduced-sodium tomato sauce and tomato paste. Low-sodium or reduced-sodium canned vegetables. Fruits All fresh, dried, or frozen fruit. Canned fruit in natural juice (without added sugar). Meat and other protein foods Skinless chicken or [place]. Ground chicken or [place]. Pork with fat trimmed off. Fish and seafood. Egg whites. Dried beans, peas, or lentils. Unsalted nuts, nut butters, and seeds. Unsalted canned beans. Lean cuts of beef with fat trimmed off. Low-sodium, lean deli meat. Dairy Low-fat (1%) or fat-free (skim) milk. Fat-free, low-fat, or  reduced-fat cheeses. Nonfat, low-sodium ricotta or cottage cheese. Low-fat or nonfat yogurt. Low-fat, low-sodium cheese. Fats and oils Soft margarine without trans fats. Vegetable oil. Low-fat, reduced-fat, or light mayonnaise and salad dressings (reduced-sodium). Canola, safflower, olive, soybean, and sunflower oils. Avocado. Seasoning and other foods Herbs. Spices. Seasoning mixes without salt. Unsalted popcorn and pretzels. Fat-free sweets. What foods are not recommended? The items listed may not be a complete list. Talk with your dietitian about what dietary choices are best for you. Grains Baked goods made with fat, such as croissants, muffins, or some breads. Dry pasta or rice meal packs. Vegetables Creamed or fried vegetables. Vegetables in a cheese sauce. Regular canned vegetables (not low-sodium or reduced-sodium). Regular canned tomato sauce and paste (not low-sodium or reduced-sodium). Regular tomato and vegetable juice (not low-sodium or reduced-sodium). Angie Fava. Olives. Fruits Canned fruit in a light or heavy syrup. Fried fruit. Fruit in cream or butter sauce. Meat and other protein foods Fatty cuts of meat. Ribs. Fried meat. Berniece Salines. Sausage. Bologna and other processed lunch meats. Salami. Fatback. Hotdogs. Bratwurst. Salted nuts and seeds. Canned beans with added salt. Canned or smoked fish. Whole eggs or egg yolks. Chicken or [place] with skin. Dairy Whole or 2% milk, cream, and half-and-half. Whole or full-fat cream cheese. Whole-fat or sweetened yogurt. Full-fat cheese. Nondairy creamers. Whipped toppings. Processed cheese and cheese spreads. Fats and oils Butter. Stick margarine. Lard. Shortening. Ghee. Bacon fat. Tropical oils, such as coconut, palm kernel, or palm oil. Seasoning and other foods Salted popcorn and pretzels. Onion salt, garlic salt, seasoned salt, table salt, and sea salt. Worcestershire sauce. Tartar sauce. Barbecue sauce. Teriyaki sauce. Soy sauce, including  reduced-sodium. Steak sauce. Canned and packaged gravies. Fish sauce. Oyster sauce. Cocktail sauce. Horseradish that you find on the shelf. Ketchup. Mustard. Meat flavorings and tenderizers. Bouillon cubes. Hot sauce and Tabasco sauce. Premade or packaged marinades. Premade or packaged taco seasonings. Relishes. Regular salad dressings. Where to find more information:  National Heart, Lung, and [place]: https://wilson-eaton.com/  American Heart Association: www.heart.org Summary  The DASH eating plan is a healthy eating plan that has been shown to reduce high blood pressure (hypertension). It may also reduce your risk for type 2 diabetes, heart disease, and stroke.  With the DASH eating plan, you should limit salt (sodium) intake to 2,300 mg a day. If you have hypertension, you may need to reduce your sodium intake to 1,500 mg a day.  When on the DASH eating plan, aim to eat more fresh fruits and vegetables, whole grains, lean proteins, low-fat dairy, and heart-healthy fats.  Work with your health care provider or diet and nutrition specialist (dietitian) to adjust your eating plan to your individual calorie needs. This information is not intended to replace advice given to you by your health care provider. Make sure you discuss any questions you have with your health care provider. Document  Released: 03/12/2011 Document Revised: 03/16/2016 Document Reviewed: 03/16/2016 Elsevier Interactive Patient Education  2018 [place], Daune Perch, NP  02/08/2018 7:55 AM    [facility]

## 2018-02-17 ENCOUNTER — Encounter: Payer: Self-pay | Admitting: Gynecology

## 2018-02-17 ENCOUNTER — Ambulatory Visit (INDEPENDENT_AMBULATORY_CARE_PROVIDER_SITE_OTHER): Payer: Medicare Other | Admitting: Gynecology

## 2018-02-17 VITALS — BP 134/82 | Ht 64.0 in | Wt 268.0 lb

## 2018-02-17 DIAGNOSIS — Z853 Personal history of malignant neoplasm of breast: Secondary | ICD-10-CM

## 2018-02-17 DIAGNOSIS — Z01419 Encounter for gynecological examination (general) (routine) without abnormal findings: Secondary | ICD-10-CM | POA: Diagnosis not present

## 2018-02-17 DIAGNOSIS — N952 Postmenopausal atrophic vaginitis: Secondary | ICD-10-CM | POA: Diagnosis not present

## 2018-02-17 DIAGNOSIS — Z9289 Personal history of other medical treatment: Secondary | ICD-10-CM | POA: Diagnosis not present

## 2018-02-17 NOTE — Patient Instructions (Signed)
Follow-up in 1 year for annual exam, sooner if any issues. 

## 2018-02-17 NOTE — Progress Notes (Signed)
    Vicki [name] September 15, 1934 [id number]        83 y.o.  [id number] for annual gynecologic exam.  Without gynecologic complaints.  Past medical history,surgical history, problem list, medications, allergies, family history and social history were all reviewed and documented as reviewed in the EPIC chart.  ROS:  Performed with pertinent positives and negatives included in the history, assessment and plan.   Additional significant findings : None   Exam: Caryn Bee assistant Vitals:   02/17/18 1531  BP: 134/82  Weight: 268 lb (121.6 kg)  Height: 5\' 4"  (1.626 m)   Body mass index is 46 kg/m.  General appearance:  Normal affect, orientation and appearance. Skin: Grossly normal HEENT: Without gross lesions.  No cervical or supraclavicular adenopathy. Thyroid normal.  Lungs:  Clear without wheezing, rales or rhonchi Cardiac: RR, without RMG Abdominal:  Soft, nontender, without masses, guarding, rebound, organomegaly or hernia Breasts:  Examined lying and sitting without masses, retractions, discharge or axillary adenopathy.  Well-healed left lumpectomy scar Pelvic:  Ext, BUS, Vagina: Normal with atrophic changes  Cervix: With atrophic changes  Uterus: Difficult to palpate but no gross masses or tenderness  Adnexa: Without gross masses or tenderness    Anus and perineum: Normal   Rectovaginal: Normal sphincter tone without palpated masses or tenderness.    Assessment/Plan:  83 y.o. [id number] female for annual gynecologic exam.   1. Postmenopausal/atrophic genital changes.  No significant menopausal symptoms or any bleeding. 2. History of left breast cancer.  Exam NED.  Mammography 12/2017. 3. Pap smear 2013.  No Pap smear done today.  No history of significant abnormal Pap smears.  We both agree to stop screening per current screening guidelines based on age. 4. Bone health.  She is followed by Dr. Joylene Draft and will continue to follow-up with him in reference to bone  health. 5. Colonoscopy 2017.  Repeat at their recommended interval. 6. Health maintenance.  No routine lab work done as patient reports this done elsewhere.  Follow-up 1 year, sooner as needed.   [name], 4:02 PM 02/17/2018

## 2018-03-22 ENCOUNTER — Encounter: Payer: Medicare Other | Admitting: Allergy & Immunology

## 2018-06-09 ENCOUNTER — Ambulatory Visit: Payer: Medicare Other | Admitting: Nurse Practitioner

## 2018-06-22 ENCOUNTER — Other Ambulatory Visit: Payer: Self-pay | Admitting: Internal Medicine

## 2018-06-22 DIAGNOSIS — M542 Cervicalgia: Secondary | ICD-10-CM

## 2018-07-20 ENCOUNTER — Other Ambulatory Visit: Payer: Self-pay | Admitting: Cardiovascular Disease

## 2018-07-25 ENCOUNTER — Other Ambulatory Visit: Payer: Medicare Other

## 2018-09-06 ENCOUNTER — Ambulatory Visit
Admission: RE | Admit: 2018-09-06 | Discharge: 2018-09-06 | Disposition: A | Discharge status: Home / Self Care | Payer: Medicare Other | Source: Ambulatory Visit | Attending: Internal Medicine | Admitting: Internal Medicine

## 2018-09-06 ENCOUNTER — Other Ambulatory Visit: Payer: Self-pay

## 2018-09-06 DIAGNOSIS — M542 Cervicalgia: Secondary | ICD-10-CM

## 2018-09-28 ENCOUNTER — Telehealth: Payer: Self-pay | Admitting: Neurology

## 2018-09-28 NOTE — Telephone Encounter (Signed)
Called the patient in reference to upcoming apt on 6/29. Patient is scheduled for a am apt with Dr Brett Fairy. Called to inform the patient that Dr Brett Fairy is offering video visits in the afternoon and has some openings on Monday afternoon or transitioning her to see the NP if she wants to come in office. I have LVM asking the patient to call back to discuss.

## 2018-10-03 ENCOUNTER — Ambulatory Visit: Payer: Medicare Other | Admitting: Neurology

## 2018-10-27 ENCOUNTER — Other Ambulatory Visit: Payer: Self-pay

## 2018-10-27 ENCOUNTER — Encounter: Payer: Self-pay | Admitting: Family Medicine

## 2018-10-27 ENCOUNTER — Ambulatory Visit (INDEPENDENT_AMBULATORY_CARE_PROVIDER_SITE_OTHER): Payer: Medicare Other | Admitting: Family Medicine

## 2018-10-27 VITALS — BP 158/59 | HR 59 | Temp 98.0°F | Ht 64.0 in | Wt 268.4 lb

## 2018-10-27 DIAGNOSIS — G4733 Obstructive sleep apnea (adult) (pediatric): Secondary | ICD-10-CM | POA: Diagnosis not present

## 2018-10-27 DIAGNOSIS — Z9989 Dependence on other enabling machines and devices: Secondary | ICD-10-CM

## 2018-10-27 NOTE — Progress Notes (Signed)
PATIENT: Vicki [name] DOB: 11-24-1934  REASON FOR VISIT: follow up HISTORY FROM: patient  Chief Complaint  Patient presents with  . Follow-up    Room 2, Alone. CPAP. states she is doing well.     HISTORY OF PRESENT ILLNESS: Today 10/27/18 Vicki JERUSALEN [name] is a 84 y.o. female here today for follow up of OSA on CPAP.  She continues to do well with CPAP compliance.  She reports that she is using her CPAP nightly.  She has noted an air leak around the sides of her mask.  She notes that this is due to her not tightening her straps.  She is also interested in considering nonprescription therapy for helping her rest.  She goes to bed at varying hours.  She reports that sometimes she will just lie in the bed and not be able to go to sleep.  Compliance report dated 09/26/2018 through 10/25/2018 reveals that she is using her CPAP nightly for compliance of 100%.  29 days she used a greater than 4 hours for compliance of 97%.  AHI was 1.2 on 5 to 12 cm of water and EPR of 1.  There was a significant leak noted in the 95th percentile of 31.8.  She returns today for evaluation.  HISTORY: (copied from [employer] note on 06/04/2017)  UPDATE 3/1/2019CM Vicki [name], 84 year old female returns for follow-up.  She has a history of obstructive sleep apnea and just recently obtained a new machine she is here for compliance.  She is tolerating CPAP without difficulty.  Compliance data dated 05/04/2017 - 06/02/2017 shows compliance greater than 4 hours at 100%.  Average usage 9 hours 5 minutes.  Set pressure 5-12 cm EPR 1.  AHI 1.9.  ESS 2.  She returns for reevaluation  02/05/17 CDMrs. [name] has been my sleep apnea patient for over 12 years, she isan afro-american married ladymeanwhile 84 years of age- and feels and looks well. Today is 05 February 2017 and she is here for a compliance visit. In 2010 she was diagnosed with breast cancer which was surgically treated ( Dr. Neldon Mc )only. She reports  the tragic loss of her son at age 96 earlier this summer after a long history of polysubstance abuse. Her husband is now 85 and still works part time. She was very worried over his infrequent syncopes- and after he had finally been evaluated by a loop recorder a ventricular tachycardia was found. He doesn't tolerate the medication though. Vicki [name] has used the machine with 100% compliance for 9 hours nightly, with a setting of 12 cmH2O-2 cm EPR Her residual AHI is 0.7. She does have some significant air leaks, I would like to change her from nasal pillow to nasal covering mask and recommended the [employer] model. Her humdidier is broken and cannot be repaired- I will order auto set CPAP 5-12 cm , a new machine.  Today 01/28/2016: Vicki [name] is a 84 year old female with a history of obstructive sleep apnea on CPAP. She returns today for follow-up. Her compliance download indicates that she uses her machine 30 out of 30 days for compliance of 100%. She uses her machine greater than 4 hours 29 out of 30 days for compliance of 97%. She has a residual AHI is 0.5 on 12 cm water with EPR 2. She does not have a significant leak. She feels that the CPAP is working well for her. Her Epworth sleepiness score is 10 and fatigue severity score is 49.  She contributes the elevation in her scores to her pain. She states that she's had 2 rotator cuff surgeries, osteoarthritis and was recently diagnosis with some "back issues." She states that surgery was recommended however she declined and is just in pain management at [facility]. She returns today for an evaluation.  HISTORY copied from Dr. Edwena Felty notes: Vicki [name] , a right handed , married lady from [place] is retired. She was diagnosed with obstructive sleep apnea , the sleep study took place in 2005.  She is seen today on 01-23-15 and recently had a download of her CPAP machine through her DME, [facility]. Dated 12-10-14  she was 100% compliance for 90 days of use and 100% of those days for over 4 hours of continued use. Her average usage was 7 hours and 42 minutes. Set pressure remains at 12 cm water pressure and her EPR is set at 2 cm water the residual AHI is 0.6. She does have moderate to severe air leaks at times. Vicki [name] reports that her sleep has been impaired not by apnea but by knee pain and other arthritis and degenerative joint disease related discomfort. She endorsed today the Epworth sleepiness score at 9 points and the fatigue severity score at 45 points. She does not feel depressed her geriatric depression score is endorsed at 2 out of 15 points her sleep habits has not overall changed but the sleep quality has changed with the pain. She usually goes to bed around 11 PM she falls asleep rather promptly she states but wakes up frequently through the night she also has trouble finding a comfortable sleep position. She wakes up about 3 times at night goes to the bathroom twice on average. She does fall asleep again she rises in the morning at about 6 AM -she has never used an alarm and her inner clock is well trained ! She rearely naps in daytime, just sitting will hurt her back,  She drinks decaf the native coffee in the morning, and she may have to iced teas a week and disorder a week. Her caffeine intake is truly low. She is a nonsmoker lifelong and a nondrinker. She has no shift work history.  REVIEW OF SYSTEMS: Out of a complete 14 system review of symptoms, the patient complains only of the following symptoms, shortness of breath, constipation, food allergies, incontinence of bladder, blood in urine, joint pain, back pain, achy muscles, walking difficulty, neck pain, moles, itching and all other reviewed systems are negative.  Epworth sleepiness scale: 10 Fatigue severity scale: 61  ALLERGIES: Allergies  Allergen Reactions  . Shellfish Allergy Swelling  . Sulfa Antibiotics Hives  . Pantoprazole  Sodium Palpitations  . Sulfur Swelling and Rash    HOME MEDICATIONS: Outpatient Medications Prior to Visit  Medication Sig Dispense Refill  . allopurinol (ZYLOPRIM) 300 MG tablet Take 150 mg by mouth daily.    Marland Kitchen amLODipine (NORVASC) 5 MG tablet Take 5 mg by mouth daily.     Marland Kitchen aspirin 81 MG tablet Take 81 mg by mouth every other day.     . carvedilol (COREG) 12.5 MG tablet TAKE 1 TABLET BY MOUTH TWICE DAILY WITH A MEAL. 180 tablet 1  . chlorthalidone (HYGROTON) 25 MG tablet Take 12.5 mg by mouth daily.    . Cholecalciferol (VITAMIN D) 1000 UNITS capsule Take 1,000 Units by mouth daily.     . Coenzyme Q10 (CO Q 10 PO) Take 1 tablet by mouth daily.     Marland Kitchen  EPIPEN 2-PAK 0.3 MG/0.3ML DEVI 0.3 mg.     . HYDROcodone-acetaminophen (NORCO/VICODIN) 5-325 MG tablet Take 1-2 tablets by mouth every 6 (six) hours as needed for moderate pain. 10 tablet 0  . lidocaine (LIDODERM) 5 % Place 1 patch onto the skin daily. Remove & Discard patch within 12 hours or as directed by MD 10 patch 0  . losartan (COZAAR) 100 MG tablet Take 100 mg by mouth at bedtime.    . rosuvastatin (CRESTOR) 10 MG tablet Take 10 mg by mouth daily.     No facility-administered medications prior to visit.     PAST MEDICAL HISTORY: Past Medical History:  Diagnosis Date  . Angio-edema   . Arthritis    osteo  . Asthma    none in years  . Bradycardia   . Breast cancer ([place]) 2010   Left  . Chest pain   . Chronic kidney disease    states elevated creatnine- followed by Dr Joylene Draft  . Complication of anesthesia    following tympanoplasty in 1997- "HAD TIGHTENING AROUND CHEST FOLLOWING ANESTHESIA'- states has done well x 2 since  . Diabetes mellitus    borderline/diet controlled  . GERD (gastroesophageal reflux disease)   . Gout   . Hypertension   . Obesity   . Personal history of radiation therapy   . Scoliosis   . Sleep apnea    states setting on 11- followed by Dr Westley Hummer yearly- last study 7 years ago  . Sleep apnea  with use of continuous positive airway pressure (CPAP) 01/24/2013  . Urticaria     PAST SURGICAL HISTORY: Past Surgical History:  Procedure Laterality Date  . BREAST LUMPECTOMY Left 07/2008   Left - Dr Margot Chimes  . CARPAL TUNNEL RELEASE     right  . DILATION AND CURETTAGE OF UTERUS    . SHOULDER OPEN ROTATOR CUFF REPAIR  05/27/2011   Procedure: ROTATOR CUFF REPAIR SHOULDER OPEN;  Surgeon: Tobi Bastos, MD;  Location: [facility];  Service: Orthopedics;  Laterality: Right;  Right Shoulder Rotator Cuff Repair complex    . TONSILLECTOMY    . TYMPANOPLASTY  1997   right    FAMILY HISTORY: Family History  Problem Relation Age of Onset  . Diabetes Mother   . Breast cancer Sister        Age unknown  . Diabetes Sister   . Diabetes Brother   . Breast cancer Maternal Grandmother        Age unknown    SOCIAL HISTORY: Social History   Socioeconomic History  . Marital status: Married    Spouse name: Not on file  . Number of children: Not on file  . Years of education: Not on file  . Highest education level: Not on file  Occupational History  . Not on file  Social Needs  . Financial resource strain: Not on file  . Food insecurity    Worry: Not on file    Inability: Not on file  . Transportation needs    Medical: Not on file    Non-medical: Not on file  Tobacco Use  . Smoking status: Former Smoker    Quit date: 04/06/1961    Years since quitting: 57.5  . Smokeless tobacco: Never Used  Substance and Sexual Activity  . Alcohol use: No  . Drug use: No  . Sexual activity: Not Currently    Birth control/protection: Post-menopausal    Comment: 1st intercourse 58 yo-5 partners  Lifestyle  . Physical activity  Days per week: Not on file    Minutes per session: Not on file  . Stress: Not on file  Relationships  . Social Herbalist on phone: Not on file    Gets together: Not on file    Attends religious service: Not on file    Active member of club or organization:  Not on file    Attends meetings of clubs or organizations: Not on file    Relationship status: Not on file  . Intimate partner violence    Fear of current or ex partner: Not on file    Emotionally abused: Not on file    Physically abused: Not on file    Forced sexual activity: Not on file  Other Topics Concern  . Not on file  Social History Narrative   Right handed, Married, 6 kids, Caffeine 1 cup daily, Retired,  [employer] grad,       PHYSICAL EXAM  Vitals:   10/27/18 1520  BP: (!) 158/59  Pulse: (!) 59  Temp: 98 F (36.7 C)  Weight: 268 lb 6.4 oz (121.7 kg)  Height: 5\' 4"  (1.626 m)   Body mass index is 46.07 kg/m.  Generalized: Well developed, in no acute distress  Cardiology: normal rate and rhythm, no murmur noted Neurological examination  Mentation: Alert oriented to time, place, history taking. Follows all commands speech and language fluent Cranial nerve II-XII: Pupils were equal round reactive to light. Extraocular movements were full, visual field were full on confrontational test. Facial sensation and strength were normal. Uvula tongue midline. Head turning and shoulder shrug  were normal and symmetric. Motor: The motor testing reveals 4 over 5 strength of all 4 extremities. Good symmetric motor tone is noted throughout.   Coordination: Cerebellar testing reveals good finger-nose-finger and heel-to-shin bilaterally.  Gait and station: Gait is normal.   DIAGNOSTIC DATA (LABS, IMAGING, TESTING) - I reviewed patient records, labs, notes, testing and imaging myself where available.  No flowsheet data found.   Lab Results  Component Value Date   WBC 10.6 (H) 08/26/2016   HGB 12.5 08/26/2016   HCT 38.5 08/26/2016   MCV 84.1 08/26/2016   PLT 218 08/26/2016      Component Value Date/Time   NA 140 08/26/2016 1737   NA 140 07/06/2013 0900   K 4.0 08/26/2016 1737   K 3.9 07/06/2013 0900   CL 106 08/26/2016 1737   CL 106 07/05/2012 1101   CO2 24 08/26/2016 1737    CO2 20 (L) 07/06/2013 0900   GLUCOSE 111 (H) 08/26/2016 1737   GLUCOSE 170 (H) 07/06/2013 0900   GLUCOSE 182 (H) 07/05/2012 1101   BUN 26 (H) 08/26/2016 1737   BUN 21.2 07/06/2013 0900   CREATININE 1.57 (H) 08/26/2016 1737   CREATININE 1.4 (H) 07/06/2013 0900   CALCIUM 8.9 08/26/2016 1737   CALCIUM 9.4 07/06/2013 0900   PROT 7.6 08/26/2016 1737   PROT 7.3 07/06/2013 0900   ALBUMIN 3.9 08/26/2016 1737   ALBUMIN 3.6 07/06/2013 0900   AST 21 08/26/2016 1737   AST 14 07/06/2013 0900   ALT 16 08/26/2016 1737   ALT 13 07/06/2013 0900   ALKPHOS 83 08/26/2016 1737   ALKPHOS 102 07/06/2013 0900   BILITOT 0.4 08/26/2016 1737   BILITOT 0.37 07/06/2013 0900   GFRNONAA 30 (L) 08/26/2016 1737   GFRAA 35 (L) 08/26/2016 1737   No results found for: CHOL, HDL, LDLCALC, LDLDIRECT, TRIG, CHOLHDL No results found for: HGBA1C No  results found for: VITAMINB12 No results found for: TSH   ASSESSMENT AND PLAN 84 y.o. year old female  has a past medical history of Angio-edema, Arthritis, Asthma, Bradycardia, Breast cancer ([place]) (2010), Chest pain, Chronic kidney disease, Complication of anesthesia, Diabetes mellitus, GERD (gastroesophageal reflux disease), Gout, Hypertension, Obesity, Personal history of radiation therapy, Scoliosis, Sleep apnea, Sleep apnea with use of continuous positive airway pressure (CPAP) (01/24/2013), and Urticaria. here with     ICD-10-CM   1. OSA on CPAP  G47.33    Z99.89     Ms. Pangle continues to do well with excellent compliance of CPAP therapy.  We have discussed concerns of week.  She will work on tightening straps at home.  I will download report in 1 month for review of leak.  If uncorrected we will follow-up as appropriate.  She was encouraged to continue using CPAP nightly and for greater than 4 hours each night.  I have also advised she consider melatonin or valerian root over-the-counter to help her rest.  She will follow-up with [place] annually, sooner if needed.  She  verbalizes understanding and agreement with this plan.   No orders of the defined types were placed in this encounter.    No orders of the defined types were placed in this encounter.     I spent 15 minutes with the patient. 50% of this time was spent counseling and educating patient on plan of care and medications.    Debbora Presto, FNP-C 10/27/2018, 3:47 PM [facility] [address], [place], [place] [address] [phone]

## 2018-10-27 NOTE — Patient Instructions (Signed)
Continue nightly use of CPAP and for greater than 4 hours each night  Try Valerian Root or Melatonin OTC for sleep if needed   Follow up annually   Sleep Apnea Sleep apnea affects breathing during sleep. It causes breathing to stop for a short time or to become shallow. It can also increase the risk of:  Heart attack.  Stroke.  Being very overweight (obese).  Diabetes.  Heart failure.  Irregular heartbeat. The goal of treatment is to help you breathe normally again. What are the causes? There are three kinds of sleep apnea:  Obstructive sleep apnea. This is caused by a blocked or collapsed airway.  Central sleep apnea. This happens when the brain does not send the right signals to the muscles that control breathing.  Mixed sleep apnea. This is a combination of obstructive and central sleep apnea. The most common cause of this condition is a collapsed or blocked airway. This can happen if:  Your throat muscles are too relaxed.  Your tongue and tonsils are too large.  You are overweight.  Your airway is too small. What increases the risk?  Being overweight.  Smoking.  Having a small airway.  Being older.  Being female.  Drinking alcohol.  Taking medicines to calm yourself (sedatives or tranquilizers).  Having family members with the condition. What are the signs or symptoms?  Trouble staying asleep.  Being sleepy or tired during the day.  Getting angry a lot.  Loud snoring.  Headaches in the morning.  Not being able to focus your mind (concentrate).  Forgetting things.  Less interest in sex.  Mood swings.  Personality changes.  Feelings of sadness (depression).  Waking up a lot during the night to pee (urinate).  Dry mouth.  Sore throat. How is this diagnosed?  Your medical history.  A physical exam.  A test that is done when you are sleeping (sleep study). The test is most often done in a sleep lab but may also be done at home.  How is this treated?   Sleeping on your side.  Using a medicine to get rid of mucus in your nose (decongestant).  Avoiding the use of alcohol, medicines to help you relax, or certain pain medicines (narcotics).  Losing weight, if needed.  Changing your diet.  Not smoking.  Using a machine to open your airway while you sleep, such as: ? An oral appliance. This is a mouthpiece that shifts your lower jaw forward. ? A CPAP device. This device blows air through a mask when you breathe out (exhale). ? An EPAP device. This has valves that you put in each nostril. ? A BPAP device. This device blows air through a mask when you breathe in (inhale) and breathe out.  Having surgery if other treatments do not work. It is important to get treatment for sleep apnea. Without treatment, it can lead to:  High blood pressure.  Coronary artery disease.  In men, not being able to have an erection (impotence).  Reduced thinking ability. Follow these instructions at home: Lifestyle  Make changes that your doctor recommends.  Eat a healthy diet.  Lose weight if needed.  Avoid alcohol, medicines to help you relax, and some pain medicines.  Do not use any products that contain nicotine or tobacco, such as cigarettes, e-cigarettes, and chewing tobacco. If you need help quitting, ask your doctor. General instructions  Take over-the-counter and prescription medicines only as told by your doctor.  If you were given a  machine to use while you sleep, use it only as told by your doctor.  If you are having surgery, make sure to tell your doctor you have sleep apnea. You may need to bring your device with you.  Keep all follow-up visits as told by your doctor. This is important. Contact a doctor if:  The machine that you were given to use during sleep bothers you or does not seem to be working.  You do not get better.  You get worse. Get help right away if:  Your chest hurts.  You have  trouble breathing in enough air.  You have an uncomfortable feeling in your back, arms, or stomach.  You have trouble talking.  One side of your body feels weak.  A part of your face is hanging down. These symptoms may be an emergency. Do not wait to see if the symptoms will go away. Get medical help right away. Call your local emergency services (911 in the [place] Do not drive yourself to the hospital. Summary  This condition affects breathing during sleep.  The most common cause is a collapsed or blocked airway.  The goal of treatment is to help you breathe normally while you sleep. This information is not intended to replace advice given to you by your health care provider. Make sure you discuss any questions you have with your health care provider. Document Released: 12/31/2007 Document Revised: 01/07/2018 Document Reviewed: 11/16/2017 Elsevier Patient Education  2020 [employer].

## 2018-11-01 DIAGNOSIS — E039 Hypothyroidism, unspecified: Secondary | ICD-10-CM | POA: Diagnosis present
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
## 2018-11-11 ENCOUNTER — Encounter: Payer: Self-pay | Admitting: Cardiovascular Disease

## 2018-11-11 ENCOUNTER — Other Ambulatory Visit: Payer: Self-pay

## 2018-11-11 ENCOUNTER — Ambulatory Visit (INDEPENDENT_AMBULATORY_CARE_PROVIDER_SITE_OTHER): Payer: Medicare Other | Admitting: Cardiovascular Disease

## 2018-11-11 VITALS — BP 134/66 | HR 60 | Ht 64.0 in | Wt 271.6 lb

## 2018-11-11 DIAGNOSIS — G4733 Obstructive sleep apnea (adult) (pediatric): Secondary | ICD-10-CM

## 2018-11-11 DIAGNOSIS — I10 Essential (primary) hypertension: Secondary | ICD-10-CM

## 2018-11-11 DIAGNOSIS — Z9989 Dependence on other enabling machines and devices: Secondary | ICD-10-CM | POA: Diagnosis not present

## 2018-11-11 NOTE — Patient Instructions (Signed)

## 2018-12-14 ENCOUNTER — Telehealth: Payer: Self-pay | Admitting: Family Medicine

## 2018-12-14 NOTE — Telephone Encounter (Signed)
Please let Vicki [name] know that I have reviewed her most recent compliance report.  Apneic events continue to be well managed at 1.9.  Her leak continues to be a little high.  Was she able to adjust her mask?  Does she feel that she would benefit from a mask refitting?  I am happy to provide an order if needed.

## 2018-12-15 NOTE — Telephone Encounter (Signed)
Pt is now asking for a call back from [place]

## 2018-12-15 NOTE — Telephone Encounter (Signed)
Patient mentioned that she believes the problem was coming from getting her blue caps mixed up that go around her mouthpiece. She stated that she just ordered some new ones that will be there in 7 days. Patient has an adjustable mask. She stated that she would like to wait a month to see how the new mouthpiece works before doing a Financial controller.

## 2018-12-15 NOTE — Telephone Encounter (Signed)
Pt has called back and stated she does not think so for right now.  Pt states it seems to be working okay

## 2018-12-15 NOTE — Telephone Encounter (Signed)
Unable to get in contact with the patient. I left a voicemail with her recent cpap compliance findings (DPR verified). I asked that she call us back to let us know if a order needs to be placed for a mask refitting. Office number was provided.

## 2018-12-21 ENCOUNTER — Other Ambulatory Visit: Payer: Self-pay | Admitting: Gynecology

## 2018-12-21 DIAGNOSIS — Z1231 Encounter for screening mammogram for malignant neoplasm of breast: Secondary | ICD-10-CM

## 2019-01-12 ENCOUNTER — Encounter: Payer: Self-pay | Admitting: Gynecology

## 2019-01-26 ENCOUNTER — Other Ambulatory Visit: Payer: Self-pay | Admitting: Cardiovascular Disease

## 2019-02-06 ENCOUNTER — Ambulatory Visit
Admission: RE | Admit: 2019-02-06 | Discharge: 2019-02-06 | Disposition: A | Discharge status: Home / Self Care | Payer: Medicare Other | Source: Ambulatory Visit | Attending: Gynecology | Admitting: Gynecology

## 2019-02-06 ENCOUNTER — Other Ambulatory Visit: Payer: Self-pay

## 2019-02-06 DIAGNOSIS — Z1231 Encounter for screening mammogram for malignant neoplasm of breast: Secondary | ICD-10-CM

## 2019-04-26 ENCOUNTER — Ambulatory Visit: Payer: Medicare Other | Attending: Internal Medicine

## 2019-04-26 ENCOUNTER — Other Ambulatory Visit: Payer: Self-pay

## 2019-04-26 DIAGNOSIS — Z23 Encounter for immunization: Secondary | ICD-10-CM

## 2019-04-26 NOTE — Progress Notes (Signed)
   Covid-19 Vaccination Clinic  Name:  Vicki [name]    MRN: [id number] DOB: [birth year]  04/26/2019  Vicki [name] was observed post Covid-19 immunization for 30 minutes based on pre-vaccination screening without incidence. She was provided with Vaccine Information Sheet and instruction to access the V-Safe system.   Vicki [name] was instructed to call 911 with any severe reactions post vaccine: Marland Kitchen Difficulty breathing  . Swelling of your face and throat  . A fast heartbeat  . A bad rash all over your body  . Dizziness and weakness    Immunizations Administered    Name Date Dose VIS Date Route   Pfizer COVID-19 Vaccine 04/26/2019  4:17 PM 0.3 mL 03/17/2019 Intramuscular   Manufacturer: [place]   Lot: [id number]   [place]: [address]-9

## 2019-05-06 ENCOUNTER — Ambulatory Visit: Payer: Medicare Other

## 2019-05-17 ENCOUNTER — Ambulatory Visit: Payer: Medicare Other

## 2019-05-17 ENCOUNTER — Ambulatory Visit: Payer: Medicare Other | Attending: Internal Medicine

## 2019-05-17 DIAGNOSIS — Z23 Encounter for immunization: Secondary | ICD-10-CM

## 2019-05-17 NOTE — Progress Notes (Signed)
   Covid-19 Vaccination Clinic  Name:  Vicki [name]    MRN: [id number] DOB: May 07, 1934  05/17/2019  [name] was observed post Covid-19 immunization for 15 minutes without incidence. She was provided with Vaccine Information Sheet and instruction to access the V-Safe system.   Vicki [name] was instructed to call 911 with any severe reactions post vaccine: Marland Kitchen Difficulty breathing  . Swelling of your face and throat  . A fast heartbeat  . A bad rash all over your body  . Dizziness and weakness    Immunizations Administered    Name Date Dose VIS Date Route   Pfizer COVID-19 Vaccine 05/17/2019  2:24 PM 0.3 mL 03/17/2019 Intramuscular   Manufacturer: [place]   Lot: [id number]   [place]: [address]-2

## 2019-06-30 ENCOUNTER — Telehealth: Payer: Self-pay | Admitting: Cardiovascular Disease

## 2019-06-30 DIAGNOSIS — R0609 Other forms of dyspnea: Secondary | ICD-10-CM

## 2019-06-30 DIAGNOSIS — R0600 Dyspnea, unspecified: Secondary | ICD-10-CM

## 2019-06-30 NOTE — Telephone Encounter (Signed)
Pt c/o Shortness Of Breath: STAT if SOB developed within the last 24 hours or pt is noticeably SOB on the phone  1. Are you currently SOB (can you hear that pt is SOB on the phone)? No  2. How long have you been experiencing SOB? About 2 months   3. Are you SOB when sitting or when up moving around? Moving around   4. Are you currently experiencing any other symptoms? No   Vicki [name] is calling due to having SOB for the past 2 months. She states every time she does any small task she gets completely out of breathe. No other symptoms occur when she is having the SOB. An appointment has been scheduled for 07/18/19 in regards to this. Please advise.

## 2019-06-30 NOTE — Telephone Encounter (Signed)
Returned call to patient who states she is having SOB with walking short distances. She denies increased swelling. She denies orthopnea; uses cpap for OSA. States she gets very little exercise and is mostly disabled due to back pain. We discussed ways for her to incorporate more steps into her day and I advised her to try the sit/stand technique for building up her leg muscles. I advised that Dr. Acie Fredrickson would like to get an echo since we have not checked her LV function in many years. She agrees with plan and is aware that our scheduler will call her to schedule the echo. She thanked me for my help.

## 2019-07-03 ENCOUNTER — Telehealth (HOSPITAL_COMMUNITY): Payer: Self-pay | Admitting: Cardiovascular Disease

## 2019-07-03 NOTE — Telephone Encounter (Signed)
I called patient to schedule Echocardiogram and she just wants to wait and not have at this time.  She was given some exercises and has been using those and wants to wait to see if they help any. She will follow up with Dr. Acie Fredrickson and then will decide if she needs to.  Order has been deleted from the WQ.

## 2019-07-18 ENCOUNTER — Encounter: Payer: Self-pay | Admitting: Cardiovascular Disease

## 2019-07-18 ENCOUNTER — Other Ambulatory Visit: Payer: Self-pay

## 2019-07-18 ENCOUNTER — Ambulatory Visit: Payer: Medicare Other | Admitting: Cardiovascular Disease

## 2019-07-18 VITALS — BP 140/62 | HR 58 | Ht 64.0 in | Wt 271.0 lb

## 2019-07-18 DIAGNOSIS — I5032 Chronic diastolic (congestive) heart failure: Secondary | ICD-10-CM | POA: Insufficient documentation

## 2019-07-18 NOTE — Patient Instructions (Signed)
Medication Instructions:  Your physician recommends that you continue on your current medications as directed. Please refer to the Current Medication list given to you today.  *If you need a refill on your cardiac medications before your next appointment, please call your pharmacy*   Lab Work: None Ordered If you have labs (blood work) drawn today and your tests are completely normal, you will receive your results only by: Marland Kitchen MyChart Message (if you have MyChart) OR . A paper copy in the mail If you have any lab test that is abnormal or we need to change your treatment, we will call you to review the results.   Testing/Procedures: None Ordered   Follow-Up: At [facility], you and your health needs are our priority.  As part of our continuing mission to provide you with exceptional heart care, we have created designated Provider Care Teams.  These Care Teams include your primary Cardiologist (physician) and Advanced Practice Providers (APPs -  Physician Assistants and Nurse Practitioners) who all work together to provide you with the care you need, when you need it.  We recommend signing up for the patient portal called "MyChart".  Sign up information is provided on this After Visit Summary.  MyChart is used to connect with patients for Virtual Visits (Telemedicine).  Patients are able to view lab/test results, encounter notes, upcoming appointments, etc.  Non-urgent messages can be sent to your provider as well.   To learn more about what you can do with MyChart, go to [email or web address].    Your next appointment:   6 month(s)  The format for your next appointment:   Either In Person or Virtual  Provider:   You may see Mertie Moores, MD or one of the following Advanced Practice Providers on your designated Care Team:    Richardson Dopp, PA-C  [place], [place]  Daune Perch, [place]
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
## 2019-10-05 ENCOUNTER — Other Ambulatory Visit: Payer: Self-pay

## 2019-10-05 ENCOUNTER — Ambulatory Visit: Payer: Medicare Other | Admitting: Neurology

## 2019-10-05 ENCOUNTER — Encounter: Payer: Self-pay | Admitting: Neurology

## 2019-10-05 VITALS — BP 154/66 | HR 59 | Ht 64.0 in | Wt 273.0 lb

## 2019-10-05 DIAGNOSIS — G8929 Other chronic pain: Secondary | ICD-10-CM | POA: Diagnosis not present

## 2019-10-05 DIAGNOSIS — G4733 Obstructive sleep apnea (adult) (pediatric): Secondary | ICD-10-CM | POA: Diagnosis not present

## 2019-10-05 DIAGNOSIS — I5032 Chronic diastolic (congestive) heart failure: Secondary | ICD-10-CM

## 2019-10-05 DIAGNOSIS — E662 Morbid (severe) obesity with alveolar hypoventilation: Secondary | ICD-10-CM

## 2019-10-05 DIAGNOSIS — Z9989 Dependence on other enabling machines and devices: Secondary | ICD-10-CM

## 2019-10-05 DIAGNOSIS — G4701 Insomnia due to medical condition: Secondary | ICD-10-CM

## 2019-10-05 MED ORDER — PREDNISONE 20 MG PO TABS: ORAL_TABLET | ORAL | 0 refills | Status: DC

## 2019-10-05 NOTE — Progress Notes (Signed)
PATIENT: Vicki [name] DOB: 1934-09-01  REASON FOR VISIT: follow up HISTORY FROM: patient  Chief Complaint  Patient presents with  . Follow-up    pt with husband, rm 58. presents for follow up with machine. DME [facility].      HISTORY OF PRESENT ILLNESS: 10/05/19 -    Vicki [name] is a 85 y.o. female here today for follow up of OSA on CPAP.  She continues to do well with CPAP compliance.  Interval history Vicki [name] is a highly compliant CPAP user she has used the machine 30 out of 30 days and 29 of these days over 4 hours with an average of 9 hours and 23 minutes.  She is using an AutoSet which was just replaced last year with a minimum setting of 5 and maximum pressure setting of 12 cmH2O.  She has a 1 cm expiratory pressure relief setting her residual AHI is 2.1.   Air leak remains high.  And 95th percentile pressure is 11.5 cm water based on this I would like to increase her pressure by 1 cm only and encouraged her to do just as she is doing now she is using the machine beautiful.  Epworth Sleepiness Scale was endorsed at 9 points fatigue severity at 63 and her geriatric depression score is endorsed at 4 points out of 15.    I would not consider her clinically depressed but certainly frustrated by ongoing arthritis pain, and limitations to mobility, severe ankle edema which also is painful at times.  The patient is status post breast cancer surgery. Her left rotator-cuff pain is interrupting her sleep.   11-2018/ She reports that she is using her CPAP nightly.  She has noted an air leak around the sides of her mask.  She notes that this is due to her not tightening her straps.  She is also interested in considering nonprescription therapy for helping her rest.  She goes to bed at varying hours.  She reports that sometimes she will just lie in the bed and not be able to go to sleep.  Compliance report dated 09/26/2018 through 10/25/2018 reveals that she is using her CPAP  nightly for compliance of 100%.  29 days she used a greater than 4 hours for compliance of 97%.  AHI was 1.2 on 5 to 12 cm of water and EPR of 1.  There was a significant leak noted in the 95th percentile of 31.8.  She returns today for evaluation.  85 y.o. year old female  has a past medical history of Angio-edema, Arthritis, Asthma, Bradycardia, Breast cancer ([place]) (2010), Chest pain, Chronic kidney disease, Complication of anesthesia, Diabetes mellitus, GERD (gastroesophageal reflux disease), Gout, Hypertension, Obesity, Personal history of radiation therapy, Scoliosis, CKD3, Vertigo, orthotatic hypotension, Sleep apnea, Sleep apnea with use of continuous positive airway pressure (CPAP) (01/24/2013), and Urticaria. here with :   HISTORY: (copied from [name]'s note on 06/04/2017) UPDATE 3/1/2019CM Vicki [name], 85 year old female returns for follow-up.  She has a history of obstructive sleep apnea and just recently obtained a new machine she is here for compliance.  She is tolerating CPAP without difficulty.  Compliance data dated 05/04/2017 - 06/02/2017 shows compliance greater than 4 hours at 100%.  Average usage 9 hours 5 minutes.  Set pressure 5-12 cm EPR 1.  AHI 1.9.  ESS 2.  She returns for reevaluation  02/05/17 CDMrs. [name] has been my sleep apnea patient for over 12 years, she isan afro-american married  ladymeanwhile 85 years of age- and feels and looks well. Today is 05 February 2017 and she is here for a compliance visit. In 2010 she was diagnosed with breast cancer which was surgically treated ( Dr. Neldon Mc )only. She reports the tragic loss of her son at age 48 earlier this summer after a long history of polysubstance abuse. Her husband is now 18 and still works part time. She was very worried over his infrequent syncopes- and after he had finally been evaluated by a loop recorder a ventricular tachycardia was found. He doesn't tolerate the medication though. [name] has  used the machine with 100% compliance for 9 hours nightly, with a setting of 12 cmH2O-2 cm EPR Her residual AHI is 0.7. She does have some significant air leaks, I would like to change her from nasal pillow to nasal covering mask and recommended the [employer] model. Her humdidier is broken and cannot be repaired- I will order auto set CPAP 5-12 cm , a new machine.  Today 01/28/2016: Vicki [name] is a 85 year old female with a history of obstructive sleep apnea on CPAP. She returns today for follow-up. Her compliance download indicates that she uses her machine 30 out of 30 days for compliance of 100%. She uses her machine greater than 4 hours 29 out of 30 days for compliance of 97%. She has a residual AHI is 0.5 on 12 cm water with EPR 2. She does not have a significant leak. She feels that the CPAP is working well for her. Her Epworth sleepiness score is 10 and fatigue severity score is 49. She contributes the elevation in her scores to her pain. She states that she's had 2 rotator cuff surgeries, osteoarthritis and was recently diagnosis with some "back issues." She states that surgery was recommended however she declined and is just in pain management at [facility]. She returns today for an evaluation.  HISTORY copied from Dr. Edwena Felty notes: Vicki [name] , a right handed , married lady from [place] is retired. She was diagnosed with obstructive sleep apnea , the sleep study took place in 2005.  She is seen today on 01-23-15 and recently had a download of her CPAP machine through her DME, [facility]. Dated 12-10-14 she was 100% compliance for 90 days of use and 100% of those days for over 4 hours of continued use. Her average usage was 7 hours and 42 minutes. Set pressure remains at 12 cm water pressure and her EPR is set at 2 cm water the residual AHI is 0.6. She does have moderate to severe air leaks at times. Vicki [name] reports that her sleep has been impaired  not by apnea but by knee pain and other arthritis and degenerative joint disease related discomfort. She endorsed today the Epworth sleepiness score at 9 points and the fatigue severity score at 45 points. She does not feel depressed her geriatric depression score is endorsed at 2 out of 15 points her sleep habits has not overall changed but the sleep quality has changed with the pain. She usually goes to bed around 11 PM she falls asleep rather promptly she states but wakes up frequently through the night she also has trouble finding a comfortable sleep position. She wakes up about 3 times at night goes to the bathroom twice on average. She does fall asleep again she rises in the morning at about 6 AM -she has never used an alarm and her inner clock is well trained !  She rearely naps in daytime, just sitting will hurt her back,  She drinks decaf the native coffee in the morning, and she may have to iced teas a week and disorder a week. Her caffeine intake is truly low. She is a nonsmoker lifelong and a nondrinker. She has no shift work history.  REVIEW OF SYSTEMS: Out of a complete 14 system review of symptoms, the patient complains only of the following symptoms, shortness of breath, constipation, food allergies, incontinence of bladder, blood in urine, joint pain, back pain, achy muscles, walking difficulty, neck pain, moles, itching and all other reviewed systems are negative.  Epworth sleepiness scale: 10 Fatigue severity scale: 61  ALLERGIES: Allergies  Allergen Reactions  . Shellfish Allergy Swelling  . Sulfa Antibiotics Hives  . Pantoprazole Sodium Palpitations  . Sulfur Swelling and Rash    HOME MEDICATIONS: Outpatient Medications Prior to Visit  Medication Sig Dispense Refill  . allopurinol (ZYLOPRIM) 300 MG tablet Take 150 mg by mouth daily.    Marland Kitchen amLODipine (NORVASC) 5 MG tablet Take 5 mg by mouth daily.     Marland Kitchen aspirin 81 MG tablet Take 81 mg by mouth every other day.     .  carvedilol (COREG) 12.5 MG tablet TAKE 1 TABLET BY MOUTH TWICE DAILY WITH A MEAL. 180 tablet 2  . chlorthalidone (HYGROTON) 25 MG tablet Take 12.5 mg by mouth daily.    . Cholecalciferol (VITAMIN D) 1000 UNITS capsule Take 1,000 Units by mouth daily.     . Coenzyme Q10 (CO Q 10 PO) Take 1 tablet by mouth daily.     Marland Kitchen EPIPEN 2-PAK 0.3 MG/0.3ML DEVI 0.3 mg.     . esomeprazole (NEXIUM) 20 MG capsule Take 20 mg by mouth daily as needed.    Marland Kitchen HYDROcodone-acetaminophen (NORCO/VICODIN) 5-325 MG tablet Take 1-2 tablets by mouth every 6 (six) hours as needed for moderate pain. 10 tablet 0  . levothyroxine (SYNTHROID) 25 MCG tablet Take 25 mcg by mouth daily.    Marland Kitchen lidocaine (LIDODERM) 5 % Place 1 patch onto the skin daily. Remove & Discard patch within 12 hours or as directed by MD 10 patch 0  . olmesartan (BENICAR) 40 MG tablet Take 20 mg by mouth daily.     . rosuvastatin (CRESTOR) 10 MG tablet Take 10 mg by mouth 3 (three) times a week.      No facility-administered medications prior to visit.    PAST MEDICAL HISTORY: Past Medical History:  Diagnosis Date  . Angio-edema   . Arthritis    osteo  . Asthma    none in years  . Bradycardia   . Breast cancer ([place]) 2010   Left  . Chest pain   . Chronic kidney disease    states elevated creatnine- followed by Dr Joylene Draft  . Complication of anesthesia    following tympanoplasty in 1997- "HAD TIGHTENING AROUND CHEST FOLLOWING ANESTHESIA'- states has done well x 2 since  . Diabetes mellitus    borderline/diet controlled  . GERD (gastroesophageal reflux disease)   . Gout   . Hypertension   . Obesity   . Personal history of radiation therapy   . Scoliosis   . Sleep apnea    states setting on 11- followed by Dr Westley Hummer yearly- last study 7 years ago  . Sleep apnea with use of continuous positive airway pressure (CPAP) 01/24/2013  . Urticaria     PAST SURGICAL HISTORY: Past Surgical History:  Procedure Laterality Date  . BREAST LUMPECTOMY  Left 07/2008   Left - Dr Margot Chimes  . CARPAL TUNNEL RELEASE     right  . DILATION AND CURETTAGE OF UTERUS    . SHOULDER OPEN ROTATOR CUFF REPAIR  05/27/2011   Procedure: ROTATOR CUFF REPAIR SHOULDER OPEN;  Surgeon: Tobi Bastos, MD;  Location: [facility];  Service: Orthopedics;  Laterality: Right;  Right Shoulder Rotator Cuff Repair complex    . TONSILLECTOMY    . TYMPANOPLASTY  1997   right    FAMILY HISTORY: Family History  Problem Relation Age of Onset  . Diabetes Mother   . Breast cancer Sister        Age unknown  . Diabetes Sister   . Diabetes Brother   . Breast cancer Maternal Grandmother        Age unknown    SOCIAL HISTORY: Married. Retired.     PHYSICAL EXAM  Vitals:   10/05/19 1543  BP: (!) 154/66  Pulse: (!) 59  Weight: 273 lb (123.8 kg)  Height: 5\' 4"  (1.626 m)   Body mass index is 46.86 kg/m.  Generalized: Well developed, in no acute distress  Cardiology: normal rate and rhythm, no murmur noted Neurological examination  Mentation: Alert oriented to time, place, history taking. Follows all commands speech and language fluent Cranial nerve II-XII: Pupils were equal round reactive to light. Extraocular movements were full, visual field were full on confrontational test. Facial sensation and strength were normal. Uvula tongue midline. Head turning and shoulder shrug  were normal and symmetric. Motor: The motor testing reveals 4 over 5 strength of all 4 extremities. Good symmetric motor tone is noted throughout.   Coordination: Cerebellar testing reveals good finger-nose-finger and heel-to-shin bilaterally.  Gait and station: Gait is normal.   DIAGNOSTIC DATA (LABS, IMAGING, TESTING) - I reviewed patient records, labs, notes, testing and imaging myself where available.  No flowsheet data found.   Lab Results  Component Value Date   WBC 10.6 (H) 08/26/2016   HGB 12.5 08/26/2016   HCT 38.5 08/26/2016   MCV 84.1 08/26/2016   PLT 218 08/26/2016       Component Value Date/Time   NA 140 08/26/2016 1737   NA 140 07/06/2013 0900   K 4.0 08/26/2016 1737   K 3.9 07/06/2013 0900   CL 106 08/26/2016 1737   CL 106 07/05/2012 1101   CO2 24 08/26/2016 1737   CO2 20 (L) 07/06/2013 0900   GLUCOSE 111 (H) 08/26/2016 1737   GLUCOSE 170 (H) 07/06/2013 0900   GLUCOSE 182 (H) 07/05/2012 1101   BUN 26 (H) 08/26/2016 1737   BUN 21.2 07/06/2013 0900   CREATININE 1.57 (H) 08/26/2016 1737   CREATININE 1.4 (H) 07/06/2013 0900   CALCIUM 8.9 08/26/2016 1737   CALCIUM 9.4 07/06/2013 0900   PROT 7.6 08/26/2016 1737   PROT 7.3 07/06/2013 0900   ALBUMIN 3.9 08/26/2016 1737   ALBUMIN 3.6 07/06/2013 0900   AST 21 08/26/2016 1737   AST 14 07/06/2013 0900   ALT 16 08/26/2016 1737   ALT 13 07/06/2013 0900   ALKPHOS 83 08/26/2016 1737   ALKPHOS 102 07/06/2013 0900   BILITOT 0.4 08/26/2016 1737   BILITOT 0.37 07/06/2013 0900   GFRNONAA 30 (L) 08/26/2016 1737   GFRAA 35 (L) 08/26/2016 1737  all labs through [facility], Dr Joylene Draft.   ASSESSMENT AND PLAN OSA on CPAP and morbid obesity is her main risk factor. The patient also struggles with joint pain, degenerative osteoarthritis. She experienced pain relief while on  steroids, more than on narcotics.     Ms. Darin continues to do well with excellent compliance of CPAP therapy.   She was encouraged to continue using CPAP nightly and for greater than 4 hours each night.  I have also advised she consider melatonin or valerian root over-the-counter to help her rest. I  A encouraged her to address the bone and joint pain with her orthopedist ( Dr Nelva Bush) and Dr Joylene Draft.   She will follow-up with [place] annually, sooner if needed.   She verbalizes understanding and agreement with this plan.  Larey Seat, MD   10/05/2019, 4:15 PM [facility] [address] [place], [place] [address] [phone]

## 2019-10-06 LAB — CK TOTAL AND CKMB (NOT AT ARMC)
CK-MB Index: 1.2 ng/mL (ref 0.0–5.3)
Total CK: 199 U/L — ABNORMAL HIGH (ref 26–161)

## 2019-10-06 LAB — ANA W/REFLEX IF POSITIVE: Anti Nuclear Antibody (ANA): NEGATIVE

## 2019-10-06 LAB — RHEUMATOID FACTOR: Rhuematoid fact SerPl-aCnc: <10 IU/mL (ref 0.0–13.9)

## 2019-10-06 NOTE — Progress Notes (Signed)
Normal (negative ) RF and ANA, and only slightly elevated CK.

## 2019-10-13 ENCOUNTER — Telehealth: Payer: Self-pay | Admitting: Neurology

## 2019-10-13 NOTE — Telephone Encounter (Signed)
Called and advised the patient of the lab findings. Informed that the Total CK was slightly elevated. Informed her I would send this information to her cardiologist and PCP

## 2019-10-13 NOTE — Telephone Encounter (Signed)
-----   Message from Larey Seat, MD sent at 10/06/2019  6:54 PM EDT ----- Normal (negative ) RF and ANA, and only slightly elevated CK.

## 2019-10-30 ENCOUNTER — Ambulatory Visit: Payer: Medicare Other | Admitting: Neurology

## 2019-11-03 ENCOUNTER — Other Ambulatory Visit: Payer: Self-pay | Admitting: Cardiovascular Disease

## 2020-01-10 ENCOUNTER — Other Ambulatory Visit: Payer: Self-pay | Admitting: Nurse Practitioner

## 2020-01-10 DIAGNOSIS — Z1231 Encounter for screening mammogram for malignant neoplasm of breast: Secondary | ICD-10-CM

## 2020-01-20 ENCOUNTER — Ambulatory Visit: Payer: Medicare Other | Attending: Internal Medicine

## 2020-01-20 DIAGNOSIS — Z23 Encounter for immunization: Secondary | ICD-10-CM

## 2020-01-20 NOTE — Progress Notes (Signed)
   Covid-19 Vaccination Clinic  Name:  Vicki [name]    MRN: [id number] DOB: 1934/12/14  01/20/2020  Vicki [name] was observed post Covid-19 immunization for 30 minutes based on pre-vaccination screening without incident. She was provided with Vaccine Information Sheet and instruction to access the V-Safe system.   Vicki [name] was instructed to call 911 with any severe reactions post vaccine: Marland Kitchen Difficulty breathing  . Swelling of face and throat  . A fast heartbeat  . A bad rash all over body  . Dizziness and weakness

## 2020-02-21 ENCOUNTER — Other Ambulatory Visit: Payer: Self-pay | Admitting: Nurse Practitioner

## 2020-02-27 ENCOUNTER — Encounter: Payer: Self-pay | Admitting: Cardiovascular Disease

## 2020-02-27 ENCOUNTER — Ambulatory Visit: Payer: Medicare Other | Admitting: Cardiovascular Disease

## 2020-02-27 ENCOUNTER — Other Ambulatory Visit: Payer: Self-pay

## 2020-02-27 ENCOUNTER — Encounter (INDEPENDENT_AMBULATORY_CARE_PROVIDER_SITE_OTHER): Payer: Self-pay

## 2020-02-27 VITALS — BP 132/59 | HR 63 | Ht 64.0 in | Wt 275.0 lb

## 2020-02-27 DIAGNOSIS — I5032 Chronic diastolic (congestive) heart failure: Secondary | ICD-10-CM | POA: Diagnosis not present

## 2020-02-27 NOTE — Patient Instructions (Signed)
Medication Instructions:  Your provider recommends that you continue on your current medications as directed. Please refer to the Current Medication list given to you today.   *If you need a refill on your cardiac medications before your next appointment, please call your pharmacy*  Follow-Up: At [facility], you and your health needs are our priority.  As part of our continuing mission to provide you with exceptional heart care, we have created designated Provider Care Teams.  These Care Teams include your primary Cardiologist (physician) and Advanced Practice Providers (APPs -  Physician Assistants and Nurse Practitioners) who all work together to provide you with the care you need, when you need it. Your next appointment:   12 month(s) The format for your next appointment:   In Person Provider:   You may see Philip Nahser, MD or one of the following Advanced Practice Providers on your designated Care Team:    Scott Weaver, PA-C  Vin Bhagat, PA-C   
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
## 2020-03-08 ENCOUNTER — Ambulatory Visit
Admission: RE | Admit: 2020-03-08 | Discharge: 2020-03-08 | Disposition: A | Discharge status: Home / Self Care | Payer: Medicare Other | Source: Ambulatory Visit | Attending: Nurse Practitioner | Admitting: Nurse Practitioner

## 2020-03-08 ENCOUNTER — Other Ambulatory Visit: Payer: Self-pay

## 2020-03-08 DIAGNOSIS — Z1231 Encounter for screening mammogram for malignant neoplasm of breast: Secondary | ICD-10-CM

## 2020-03-18 ENCOUNTER — Encounter: Payer: Medicare Other | Admitting: Nurse Practitioner

## 2020-04-09 ENCOUNTER — Encounter: Payer: Self-pay | Admitting: Neurology

## 2020-04-11 ENCOUNTER — Other Ambulatory Visit: Payer: Self-pay

## 2020-04-11 ENCOUNTER — Encounter: Payer: Self-pay | Admitting: Neurology

## 2020-04-11 ENCOUNTER — Ambulatory Visit: Payer: Medicare Other | Admitting: Neurology

## 2020-04-11 VITALS — BP 132/74 | HR 62 | Ht 64.0 in | Wt 273.0 lb

## 2020-04-11 DIAGNOSIS — G4730 Sleep apnea, unspecified: Secondary | ICD-10-CM

## 2020-04-11 DIAGNOSIS — I5032 Chronic diastolic (congestive) heart failure: Secondary | ICD-10-CM | POA: Diagnosis not present

## 2020-04-11 DIAGNOSIS — E662 Morbid (severe) obesity with alveolar hypoventilation: Secondary | ICD-10-CM

## 2020-04-11 DIAGNOSIS — Z853 Personal history of malignant neoplasm of breast: Secondary | ICD-10-CM

## 2020-04-11 NOTE — Progress Notes (Signed)
PATIENT: Vicki [name] DOB: 02-04-1935  REASON FOR VISIT: follow up HISTORY FROM: patient  Chief Complaint  Patient presents with  . Follow-up    Pt alone, rm 11. Yearly CPAP f/u. States overall things are well     INTERVAL HISTORY : 04/11/20  Vicki [name] is a 86 y.o. female here today for follow up of OSA on CPAP.  Vicki [name] has used her CPAP machine 100% and 100% of those days for over 4 hours.  Average use at time is 8 hours 46 minutes she uses an AutoSet with a mode set between a minimum pressure 5 and maximum pressure of 14 cmH2O she does not use expiratory pressure relief and her residual AHI is 2.2/h which is an excellent resolution.  There are no central apneas arising the 95th percentile pressure is 12.2 well in her current visit window.  There are some air leaks but they do not cause erroneous apnea count.  She does feel sometimes that she sleeps deeper sometimes that she has not slept enough, she continues to have a lot of of muscle skeletal pain and rheumatoligal tests in the past have not shown any kind of specific autoimmune condition.  She is a breast cancer survivor. She misses her exercise routines.  COVID 19 Fully vaccinated , third shot on 01-2020.  She endorsed the fatigue severity questionnaire at 53 out of 63 points,  the Epworth sleepiness scale however only at 6 out of 24 points.   She also endorsed the geriatric depression score at 4 out of 15 points.      04-26-19: CD RV;  She continues to do well with CPAP compliance.  Interval history Vicki [name] is a highly compliant CPAP user she has used the machine 30 out of 30 days and 29 of these days over 4 hours with an average of 9 hours and 23 minutes.  She is using an AutoSet which was just replaced last year with a minimum setting of 5 and maximum pressure setting of 12 cmH2O.  She has a 1 cm expiratory pressure relief setting her residual AHI is 2.1.   Air leak remains high.  And 95th percentile  pressure is 11.5 cm water based on this I would like to increase her pressure by 1 cm only and encouraged her to do just as she is doing now she is using the machine beautiful.  Epworth Sleepiness Scale was endorsed at 9 points fatigue severity at 63 and her geriatric depression score is endorsed at 4 points out of 15.  I would not consider her clinically depressed but certainly frustrated by ongoing arthritis pain, and limitations to mobility, severe ankle edema which also is painful at times.  The patient is status post breast cancer surgery. Her left rotator-cuff pain is interrupting her sleep.   11-2018/ She reports that she is using her CPAP nightly. She has noted an air leak around the sides of her mask.  She notes that this is due to her not tightening her straps.  She is also interested in considering nonprescription therapy for helping her rest.  She goes to bed at varying hours. She reports that sometimes she will just lie in the bed and not be able to go to sleep.  Compliance report dated 09/26/2018 through 10/25/2018 reveals that she is using her CPAP nightly for compliance of 100%.  29 days she used a greater than 4 hours for compliance of 97%.  AHI was 1.2 on  5 to 12 cm of water and EPR of 1.  There was a significant leak noted in the 95th percentile of 31.8.  She returns today for evaluation.  86 y.o. year old female  has a past medical history of Angio-edema, Arthritis, Asthma, Bradycardia, Breast cancer ([place]) (2010), Chest pain, Chronic kidney disease, Complication of anesthesia, Diabetes mellitus, GERD (gastroesophageal reflux disease), Gout, Hypertension, Obesity, Personal history of radiation therapy, Scoliosis, CKD3, Vertigo, orthotatic hypotension, Sleep apnea, Sleep apnea with use of continuous positive airway pressure (CPAP) (01/24/2013), and Urticaria. here with :   HISTORY: (copied from [name]'s note on 06/04/2017) 3/1/2019CM [name], 86 year old female returns for  follow-up.  She has a history of obstructive sleep apnea and just recently obtained a new machine she is here for compliance.  She is tolerating CPAP without difficulty.  Compliance data dated 05/04/2017 - 06/02/2017 shows compliance greater than 4 hours at 100%.  Average usage 9 hours 5 minutes.  Set pressure 5-12 cm EPR 1.  AHI 1.9.  ESS 2.  She returns for reevaluation  02/05/17 CDMrs. [name] has been my sleep apnea patient for over 12 years, she isan afro-american married ladymeanwhile 86 years of age- and feels and looks well. Today is 05 February 2017 and she is here for a compliance visit. In 2010 she was diagnosed with breast cancer which was surgically treated ( Dr. Neldon Mc )only. She reports the tragic loss of her son at age 53 earlier this summer after a long history of polysubstance abuse. Her husband is now 36 and still works part time. She was very worried over his infrequent syncopes- and after he had finally been evaluated by a loop recorder a ventricular tachycardia was found. He doesn't tolerate the medication though. [name] has used the machine with 100% compliance for 9 hours nightly, with a setting of 12 cmH2O-2 cm EPR Her residual AHI is 0.7. She does have some significant air leaks, I would like to change her from nasal pillow to nasal covering mask and recommended the [employer] model. Her humdidier is broken and cannot be repaired- I will order auto set CPAP 5-12 cm , a new machine.  Today 01/28/2016: Vicki [name] is a 86 year old female with a history of obstructive sleep apnea on CPAP. She returns today for follow-up. Her compliance download indicates that she uses her machine 30 out of 30 days for compliance of 100%. She uses her machine greater than 4 hours 29 out of 30 days for compliance of 97%. She has a residual AHI is 0.5 on 12 cm water with EPR 2. She does not have a significant leak. She feels that the CPAP is working well for her. Her Epworth  sleepiness score is 10 and fatigue severity score is 49. She contributes the elevation in her scores to her pain. She states that she's had 2 rotator cuff surgeries, osteoarthritis and was recently diagnosis with some "back issues." She states that surgery was recommended however she declined and is just in pain management at [place]. She returns today for an evaluation.  HISTORY copied from Dr. Edwena Felty notes: Mrs. Cederberg , a right handed , married lady from [place] is retired. She was diagnosed with obstructive sleep apnea , the sleep study took place in 2005.  She is seen today on 01-23-15 and recently had a download of her CPAP machine through her DME, [facility]. Dated 12-10-14 she was 100% compliance for 90 days of use and 100% of those  days for over 4 hours of continued use. Her average usage was 7 hours and 42 minutes. Set pressure remains at 12 cm water pressure and her EPR is set at 2 cm water the residual AHI is 0.6. She does have moderate to severe air leaks at times. Mrs. Stengel reports that her sleep has been impaired not by apnea but by knee pain and other arthritis and degenerative joint disease related discomfort. She endorsed today the Epworth sleepiness score at 9 points and the fatigue severity score at 45 points. She does not feel depressed her geriatric depression score is endorsed at 2 out of 15 points her sleep habits has not overall changed but the sleep quality has changed with the pain. She usually goes to bed around 11 PM she falls asleep rather promptly she states but wakes up frequently through the night she also has trouble finding a comfortable sleep position. She wakes up about 3 times at night goes to the bathroom twice on average. She does fall asleep again she rises in the morning at about 6 AM -she has never used an alarm and her inner clock is well trained ! She rearely naps in daytime, just sitting will hurt her back,  She drinks decaf the  native coffee in the morning, and she may have to iced teas a week and disorder a week. Her caffeine intake is truly low. She is a nonsmoker lifelong and a nondrinker. She has no shift work history.  REVIEW OF SYSTEMS: Out of a complete 14 system review of symptoms, the patient complains only of the following symptoms, shortness of breath, constipation, food allergies, incontinence of bladder, blood in urine, joint pain, back pain, achy muscles, walking difficulty, neck pain, moles, itching and all other reviewed systems are negative.  How likely are you to doze in the following situations: 0 = not likely, 1 = slight chance, 2 = moderate chance, 3 = high chance  Sitting and Reading? Watching Television? Sitting inactive in a public place (theater or meeting)? Lying down in the afternoon when circumstances permit? Sitting and talking to someone? Sitting quietly after lunch without alcohol? In a car, while stopped for a few minutes in traffic? As a passenger in a car for an hour without a break?  Total = 6/ 24 FSS at 46/ 63 points.    ALLERGIES: Allergies  Allergen Reactions  . Shellfish Allergy Swelling  . Sulfa Antibiotics Hives  . Pantoprazole Sodium Palpitations  . Sulfur Swelling and Rash    HOME MEDICATIONS: Outpatient Medications Prior to Visit  Medication Sig Dispense Refill  . allopurinol (ZYLOPRIM) 300 MG tablet Take 150 mg by mouth daily.    Marland Kitchen amLODipine (NORVASC) 5 MG tablet Take 5 mg by mouth daily.     Marland Kitchen aspirin 81 MG tablet Take 81 mg by mouth every other day.    . carvedilol (COREG) 12.5 MG tablet TAKE 1 TABLET BY MOUTH TWICE DAILY WITH A MEAL. 180 tablet 2  . chlorthalidone (HYGROTON) 25 MG tablet Take 12.5 mg by mouth daily.    . Cholecalciferol (VITAMIN D) 1000 UNITS capsule Take 1,000 Units by mouth daily.    . Coenzyme Q10 (CO Q 10 PO) Take 1 tablet by mouth daily.     Marland Kitchen EPIPEN 2-PAK 0.3 MG/0.3ML DEVI 0.3 mg.     . esomeprazole (NEXIUM) 20 MG capsule Take 20  mg by mouth daily as needed.    Marland Kitchen HYDROcodone-acetaminophen (NORCO/VICODIN) 5-325 MG tablet Take 1-2 tablets by  mouth every 6 (six) hours as needed for moderate pain. 10 tablet 0  . levothyroxine (SYNTHROID) 25 MCG tablet Take 25 mcg by mouth daily.    Marland Kitchen lidocaine (LIDODERM) 5 % Place 1 patch onto the skin daily. Remove & Discard patch within 12 hours or as directed by MD 10 patch 0  . olmesartan (BENICAR) 20 MG tablet Take 20 mg by mouth daily.    Marland Kitchen olmesartan (BENICAR) 40 MG tablet Take 20 mg by mouth daily.     . rosuvastatin (CRESTOR) 10 MG tablet Take 10 mg by mouth 3 (three) times a week.      No facility-administered medications prior to visit.    PAST MEDICAL HISTORY: Past Medical History:  Diagnosis Date  . Angio-edema   . Arthritis    osteo  . Asthma    none in years  . Bradycardia   . Breast cancer ([place]) 2010   Left  . Chest pain   . Chronic kidney disease    states elevated creatnine- followed by Dr Joylene Draft  . Complication of anesthesia    following tympanoplasty in 1997- "HAD TIGHTENING AROUND CHEST FOLLOWING ANESTHESIA'- states has done well x 2 since  . Diabetes mellitus    borderline/diet controlled  . GERD (gastroesophageal reflux disease)   . Gout   . Hypertension   . Obesity   . Personal history of radiation therapy   . Scoliosis   . Sleep apnea    states setting on 11- followed by Dr Westley Hummer yearly- last study 7 years ago  . Sleep apnea with use of continuous positive airway pressure (CPAP) 01/24/2013  . Urticaria     PAST SURGICAL HISTORY: Past Surgical History:  Procedure Laterality Date  . BREAST LUMPECTOMY Left 07/2008   Left - Dr Margot Chimes  . CARPAL TUNNEL RELEASE     right  . DILATION AND CURETTAGE OF UTERUS    . SHOULDER OPEN ROTATOR CUFF REPAIR  05/27/2011   Procedure: ROTATOR CUFF REPAIR SHOULDER OPEN;  Surgeon: Tobi Bastos, MD;  Location: [facility];  Service: Orthopedics;  Laterality: Right;  Right Shoulder Rotator Cuff Repair complex     . TONSILLECTOMY    . TYMPANOPLASTY  1997   right    FAMILY HISTORY: Family History  Problem Relation Age of Onset  . Diabetes Mother   . Breast cancer Sister        Age unknown  . Diabetes Sister   . Diabetes Brother   . Breast cancer Maternal Grandmother        Age unknown    SOCIAL HISTORY: Married. Retired.     PHYSICAL EXAM  Vitals:   04/11/20 1556  BP: 132/74  Pulse: 62  Weight: 273 lb (123.8 kg)  Height: 5\' 4"  (1.626 m)   Body mass index is 46.86 kg/m.  Generalized: Well developed, in no acute distress  Cardiology: normal rate and rhythm, no murmur noted Neurological examination  Mentation: Alert oriented to time, place, history taking.  Follows all commands speech and language fluent Cranial nerve :  No loss of smell or taste reported.  Pupils were equal round reactive to light.  Hearing is intact, Rinne and Weber non lateralizing.  Extraocular movements were full, visual field were full on confrontational test.  Facial sensation and strength were normal.  Uvula tongue midline. Head turning and shoulder shrug  were normal and symmetric. Motor: 3 over 5 strength of the lower 4 extremities. 4 plus out of 5 in upper extremities.  ROM for shoulder range of motion is reduced.  Can't lift the arm above shoulder level, rotator cuff injuries.  Symmetric motor tone is noted throughout.  Coordination:  Finger to nose bilaterally intact.  Gait and station: Gait is wide based, patient needed a walker. She leans onto the walker as she walks.   DIAGNOSTIC DATA (LABS, IMAGING, TESTING) - I reviewed patient records, labs, notes, testing and imaging myself where available.  No flowsheet data found.   Lab Results  Component Value Date   WBC 10.6 (H) 08/26/2016   HGB 12.5 08/26/2016   HCT 38.5 08/26/2016   MCV 84.1 08/26/2016   PLT 218 08/26/2016   all labs through [facility], Dr Joylene Draft.   ASSESSMENT AND PLAN OSA on CPAP and morbid obesity is her main risk factor for  OSA. This is unchanged for years now   The patient also struggles with joint pain, degenerative osteoarthritis, rotator cuff injuries, knee and back pain. . She is a breast cancer survivor.   She experienced pain relief while on steroids, more than on narcotics.     Ms. Fetterly continues to do well with excellent compliance of CPAP therapy.   She was encouraged to continue using CPAP nightly and for greater than 4 hours each night.  I have also advised she consider melatonin or valerian root over-the-counter to help her rest. She uses a walker and is not walking far , but appreciates the inbuilt seat - she needs to take more breaks , 80 meters is max.  She does not walk stairs. Has knee pain. Has shoulder pain. Changed orthopedic providers 2 years ago.    Larey Seat, MD   04/11/2020, 4:20 PM [facility] [address] [place] [address] [place], [place] [address] [phone]

## 2020-04-18 ENCOUNTER — Ambulatory Visit: Payer: Medicare Other | Admitting: Nurse Practitioner

## 2020-04-18 ENCOUNTER — Other Ambulatory Visit: Payer: Self-pay

## 2020-04-18 ENCOUNTER — Encounter: Payer: Self-pay | Admitting: Nurse Practitioner

## 2020-04-18 VITALS — BP 144/90 | HR 80 | Resp 14 | Ht 63.5 in | Wt 273.0 lb

## 2020-04-18 DIAGNOSIS — Z01419 Encounter for gynecological examination (general) (routine) without abnormal findings: Secondary | ICD-10-CM | POA: Diagnosis not present

## 2020-04-18 DIAGNOSIS — Z853 Personal history of malignant neoplasm of breast: Secondary | ICD-10-CM | POA: Diagnosis not present

## 2020-04-18 NOTE — Progress Notes (Signed)
   Vicki [name] 11/04/1934 [id number]   History:  86 y.o. [id number] presents for breast and pelvic exam without GYN complaints. Postmenopausual - no HRT, no bleeding. 2010 Left breast cancer managed with lumpectomy and radiation. Normal pap history.    Gynecologic History No LMP recorded. Patient is postmenopausal.   Health Maintenance Last Pap:No longer screening per guidelines Last mammogram: 03/12/2020. Results were: normal Last colonoscopy: 2017. Results were: normal Last Dexa: PCP, we do not have reports  Past medical history, past surgical history, family history and social history were all reviewed and documented in the EPIC chart. Married - husband is 26. 6 children.   ROS:  A ROS was performed and pertinent positives and negatives are included.  Exam:  Vitals:   04/18/20 1431  BP: (!) 144/90  Pulse: 80  Resp: 14  Weight: 273 lb (123.8 kg)  Height: 5' 3.5" (1.613 m)   Body mass index is 47.6 kg/m.  General appearance:  Normal Thyroid:  Symmetrical, normal in size, without palpable masses or nodularity. Respiratory  Auscultation:  Clear without wheezing or rhonchi Cardiovascular  Auscultation:  Regular rate, without rubs, murmurs or gallops  Edema/varicosities:  Not grossly evident Abdominal  Soft,nontender, without masses, guarding or rebound.  Liver/spleen:  No organomegaly noted  Hernia:  None appreciated  Skin  Inspection:  Grossly normal   Breasts: Examined lying and sitting.   Right: Without masses, retractions, discharge or axillary adenopathy.   Left: Without masses, retractions, discharge or axillary adenopathy. Gentitourinary   Inguinal/mons:  Normal without inguinal adenopathy  External genitalia:  Normal  BUS/Urethra/Skene's glands:  Normal  Vagina:  Normal  Cervix:  Normal  Uterus: Difficult to palpate due to body habitus but no gross masses or tenderness  Adnexa/parametria:     Rt: Without masses or tenderness.   Lt: Without masses or  tenderness.  Anus and perineum: Normal  Digital rectal exam: Declined  Assessment/Plan:  86 y.o. [id number] for breast and pelvic exam.   Well female exam with routine gynecological exam - Education provided on SBEs, importance of preventative screenings, current guidelines, high calcium diet, regular exercise, and multivitamin daily. Labs done elsewhere.  History of cancer of left breast - 2010 managed with lumpectomy and radiation. Normal mammograms since.   Screening for cervical cancer - Normal Pap history.  No longer screening per guidelines.  Screening for breast cancer - 2010 left breast cancer. Continue annual screenings.  Normal breast exam today.  Screening for colon cancer -2017 colonoscopy.  Will repeat at GI's recommended interval.   Follow up in 2 years for CE.     [name] [facility], 2:56 PM 04/18/2020

## 2020-04-18 NOTE — Patient Instructions (Signed)
Health Maintenance After Age 84 After age 84, you are at a higher risk for certain long-term diseases and infections as well as injuries from falls. Falls are a major cause of broken bones and head injuries in people who are older than age 84. Getting regular preventive care can help to keep you healthy and well. Preventive care includes getting regular testing and making lifestyle changes as recommended by your health care provider. Talk with your health care provider about:  Which screenings and tests you should have. A screening is a test that checks for a disease when you have no symptoms.  A diet and exercise plan that is right for you. What should I know about screenings and tests to prevent falls? Screening and testing are the best ways to find a health problem early. Early diagnosis and treatment give you the best chance of managing medical conditions that are common after age 84. Certain conditions and lifestyle choices may make you more likely to have a fall. Your health care provider may recommend:  Regular vision checks. Poor vision and conditions such as cataracts can make you more likely to have a fall. If you wear glasses, make sure to get your prescription updated if your vision changes.  Medicine review. Work with your health care provider to regularly review all of the medicines you are taking, including over-the-counter medicines. Ask your health care provider about any side effects that may make you more likely to have a fall. Tell your health care provider if any medicines that you take make you feel dizzy or sleepy.  Osteoporosis screening. Osteoporosis is a condition that causes the bones to get weaker. This can make the bones weak and cause them to break more easily.  Blood pressure screening. Blood pressure changes and medicines to control blood pressure can make you feel dizzy.  Strength and balance checks. Your health care provider may recommend certain tests to check your  strength and balance while standing, walking, or changing positions.  Foot health exam. Foot pain and numbness, as well as not wearing proper footwear, can make you more likely to have a fall.  Depression screening. You may be more likely to have a fall if you have a fear of falling, feel emotionally low, or feel unable to do activities that you used to do.  Alcohol use screening. Using too much alcohol can affect your balance and may make you more likely to have a fall. What actions can I take to lower my risk of falls? General instructions  Talk with your health care provider about your risks for falling. Tell your health care provider if: ? You fall. Be sure to tell your health care provider about all falls, even ones that seem minor. ? You feel dizzy, sleepy, or off-balance.  Take over-the-counter and prescription medicines only as told by your health care provider. These include any supplements.  Eat a healthy diet and maintain a healthy weight. A healthy diet includes low-fat dairy products, low-fat (lean) meats, and fiber from whole grains, beans, and lots of fruits and vegetables. Home safety  Remove any tripping hazards, such as rugs, cords, and clutter.  Install safety equipment such as grab bars in bathrooms and safety rails on stairs.  Keep rooms and walkways well-lit. Activity  Follow a regular exercise program to stay fit. This will help you maintain your balance. Ask your health care provider what types of exercise are appropriate for you.  If you need a cane or walker,   use it as recommended by your health care provider.  Wear supportive shoes that have nonskid soles.   Lifestyle  Do not drink alcohol if your health care provider tells you not to drink.  If you drink alcohol, limit how much you have: ? 0-1 drink a day for women. ? 0-2 drinks a day for men.  Be aware of how much alcohol is in your drink. In the [place] one drink equals one typical bottle of beer (12  oz), one-half glass of wine (5 oz), or one shot of hard liquor (1 oz).  Do not use any products that contain nicotine or tobacco, such as cigarettes and e-cigarettes. If you need help quitting, ask your health care provider. Summary  Having a healthy lifestyle and getting preventive care can help to protect your health and wellness after age 84.  Screening and testing are the best way to find a health problem early and help you avoid having a fall. Early diagnosis and treatment give you the best chance for managing medical conditions that are more common for people who are older than age 84.  Falls are a major cause of broken bones and head injuries in people who are older than age 84. Take precautions to prevent a fall at home.  Work with your health care provider to learn what changes you can make to improve your health and wellness and to prevent falls. This information is not intended to replace advice given to you by your health care provider. Make sure you discuss any questions you have with your health care provider. Document Revised: 07/14/2018 Document Reviewed: 02/03/2017 Elsevier Patient Education  2021 Elsevier Inc.  

## 2020-07-23 ENCOUNTER — Other Ambulatory Visit: Payer: Self-pay | Admitting: Cardiovascular Disease

## 2020-08-07 ENCOUNTER — Ambulatory Visit: Payer: Medicare Other

## 2020-08-07 ENCOUNTER — Ambulatory Visit: Payer: Medicare Other | Attending: Internal Medicine

## 2020-08-07 DIAGNOSIS — Z23 Encounter for immunization: Secondary | ICD-10-CM

## 2020-08-07 NOTE — Progress Notes (Signed)
   Covid-19 Vaccination Clinic  Name:  [name]    MRN: [id number] DOB: 1934-10-02  08/07/2020  Vicki [name] was observed post Covid-19 immunization for 15 minutes without incident. She was provided with Vaccine Information Sheet and instruction to access the V-Safe system.   Vicki [name] was instructed to call 911 with any severe reactions post vaccine: Marland Kitchen Difficulty breathing  . Swelling of face and throat  . A fast heartbeat  . A bad rash all over body  . Dizziness and weakness   Immunizations Administered    Name Date Dose VIS Date Route   PFIZER Comrnaty(Gray TOP) Covid-19 Vaccine 08/07/2020 11:39 AM 0.3 mL 03/14/2020 Intramuscular   Manufacturer: [employer], [employer]   Lot: [id number]   NDC: [phone]

## 2020-08-08 ENCOUNTER — Other Ambulatory Visit (HOSPITAL_COMMUNITY): Payer: Self-pay

## 2020-08-08 MED ORDER — COVID-19 MRNA VAC-TRIS(PFIZER) 30 MCG/0.3ML IM SUSP
INTRAMUSCULAR | 0 refills | Status: DC
  Filled 2020-08-08: qty 0.3, 1d supply, fill #0

## 2020-10-27 ENCOUNTER — Encounter: Payer: Self-pay | Admitting: Cardiovascular Disease
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
## 2020-10-28 ENCOUNTER — Other Ambulatory Visit: Payer: Self-pay

## 2020-10-28 ENCOUNTER — Ambulatory Visit: Payer: Medicare Other | Admitting: Cardiovascular Disease

## 2020-10-28 ENCOUNTER — Encounter: Payer: Self-pay | Admitting: Cardiovascular Disease

## 2020-10-28 VITALS — BP 126/68 | HR 62 | Ht 64.0 in | Wt 271.0 lb

## 2020-10-28 DIAGNOSIS — I5032 Chronic diastolic (congestive) heart failure: Secondary | ICD-10-CM | POA: Diagnosis not present

## 2020-10-28 NOTE — Patient Instructions (Signed)
Medication Instructions:  Your physician recommends that you continue on your current medications as directed. Please refer to the Current Medication list given to you today.  *If you need a refill on your cardiac medications before your next appointment, please call your pharmacy*  Follow-Up: At [facility], you and your health needs are our priority.  As part of our continuing mission to provide you with exceptional heart care, we have created designated Provider Care Teams.  These Care Teams include your primary Cardiologist (physician) and Advanced Practice Providers (APPs -  Physician Assistants and Nurse Practitioners) who all work together to provide you with the care you need, when you need it.  Your next appointment:   1 year(s)  The format for your next appointment:   In Person  Provider:   You may see Mertie Moores, MD or one of the following Advanced Practice Providers on your designated Care Team:   Richardson Dopp, PA-C [place], [place]

## 2021-02-13 ENCOUNTER — Other Ambulatory Visit: Payer: Self-pay | Admitting: Nurse Practitioner

## 2021-02-13 DIAGNOSIS — Z1231 Encounter for screening mammogram for malignant neoplasm of breast: Secondary | ICD-10-CM

## 2021-02-17 ENCOUNTER — Other Ambulatory Visit: Payer: Self-pay

## 2021-02-17 ENCOUNTER — Other Ambulatory Visit (HOSPITAL_BASED_OUTPATIENT_CLINIC_OR_DEPARTMENT_OTHER): Payer: Self-pay

## 2021-02-17 ENCOUNTER — Ambulatory Visit: Payer: Medicare Other | Attending: Internal Medicine

## 2021-02-17 DIAGNOSIS — Z23 Encounter for immunization: Secondary | ICD-10-CM

## 2021-02-17 MED ORDER — PFIZER COVID-19 VAC BIVALENT 30 MCG/0.3ML IM SUSP
INTRAMUSCULAR | 0 refills | Status: DC
  Filled 2021-02-17: qty 0.3, 1d supply, fill #0

## 2021-02-17 NOTE — Progress Notes (Signed)
   Covid-19 Vaccination Clinic  Name:  FAMA MUENCHOW    MRN: [id number] DOB: 1934-09-13  02/17/2021  Ms. Hubers was observed post Covid-19 immunization for 15 minutes without incident. She was provided with Vaccine Information Sheet and instruction to access the V-Safe system.   Ms. Ivanov was instructed to call 911 with any severe reactions post vaccine: Difficulty breathing  Swelling of face and throat  A fast heartbeat  A bad rash all over body  Dizziness and weakness   Immunizations Administered     Name Date Dose VIS Date Route   Pfizer Covid-19 Vaccine Bivalent Booster 02/17/2021  2:23 PM 0.3 mL 12/04/2020 Intramuscular   Manufacturer: [place]   Lot: [id number]   [place]: [phone]

## 2021-03-18 ENCOUNTER — Other Ambulatory Visit: Payer: Self-pay

## 2021-03-18 ENCOUNTER — Ambulatory Visit
Admission: RE | Admit: 2021-03-18 | Discharge: 2021-03-18 | Disposition: A | Discharge status: Home / Self Care | Payer: Medicare Other | Source: Ambulatory Visit | Attending: Nurse Practitioner | Admitting: Nurse Practitioner

## 2021-03-18 DIAGNOSIS — Z1231 Encounter for screening mammogram for malignant neoplasm of breast: Secondary | ICD-10-CM

## 2021-03-24 ENCOUNTER — Other Ambulatory Visit: Payer: Self-pay | Admitting: Cardiovascular Disease

## 2021-04-17 ENCOUNTER — Encounter: Payer: Self-pay | Admitting: Neurology

## 2021-04-17 ENCOUNTER — Ambulatory Visit (INDEPENDENT_AMBULATORY_CARE_PROVIDER_SITE_OTHER): Payer: Medicare Other | Admitting: Neurology

## 2021-04-17 VITALS — BP 165/78 | HR 58 | Ht 64.0 in | Wt 275.0 lb

## 2021-04-17 DIAGNOSIS — I5032 Chronic diastolic (congestive) heart failure: Secondary | ICD-10-CM | POA: Diagnosis not present

## 2021-04-17 DIAGNOSIS — M12812 Other specific arthropathies, not elsewhere classified, left shoulder: Secondary | ICD-10-CM

## 2021-04-17 DIAGNOSIS — G4730 Sleep apnea, unspecified: Secondary | ICD-10-CM

## 2021-04-17 DIAGNOSIS — Z853 Personal history of malignant neoplasm of breast: Secondary | ICD-10-CM

## 2021-04-17 DIAGNOSIS — M12811 Other specific arthropathies, not elsewhere classified, right shoulder: Secondary | ICD-10-CM
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
## 2021-04-17 NOTE — Patient Instructions (Signed)

## 2021-05-22 ENCOUNTER — Ambulatory Visit (INDEPENDENT_AMBULATORY_CARE_PROVIDER_SITE_OTHER): Payer: Medicare Other | Admitting: Sports Medicine

## 2021-05-22 DIAGNOSIS — M12812 Other specific arthropathies, not elsewhere classified, left shoulder: Secondary | ICD-10-CM | POA: Diagnosis not present

## 2021-05-22 DIAGNOSIS — M12811 Other specific arthropathies, not elsewhere classified, right shoulder: Secondary | ICD-10-CM

## 2021-05-22 NOTE — Progress Notes (Signed)
PCP: Crist Infante, MD  Subjective:   HPI: Patient is a 87 y.o. female with PMH of bilateral rotator cuff arthropathy s/p repair, here for bilateral shoulder pain; referred by Dr. Brett Fairy  She states she hasn't been able to use arms above her head or behind her back since her rotator cuff surgery in around 2014 and had physical therapy twice without improvement. State she can reach straight out in front of her and to the side. Cant do anything overhead. Unable to snap bra, brush hair, etc. Lives with husband who helps her perform these functions. Saw [facility] in 2021 who obtained XR which patient states showed arthritis. She endorses constant diffuse bilateral shoulder pain, hard to sleep. Takes hydrocodone about 1 or 2 pills a day for her back pain. This does help her shoulder pain when she takes it. Tried aspercream patch which does provide pain relief but makes her dizzy. Denies every trying cortisone injections.   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 No apparent bruising. No swelling Palpation is normal with no TTP over [facility] joint or bicipital groove b/l.  Significantly limited (passive) ROM:  R shoulder: Supination: 30 degrees Flexion: 110 Abduction: 90 Extension: 20 Back scratch to posterior hip   L shoulder:  Supination: 30 degrees Flexion: 90 Abduction: 60 Extension: 20 Back scratch to posterior hip   NV intact distally b/l  Unable to perform special tests due to inability to actively raise arms     Assessment & Plan:  1. Bilateral shoulder pain  Patient presents with chronic bilateral shoulder pain with PMH of bilateral rotator cuff arthropathy s/p surgical repair in around 2014. She reports since surgery she has not been able to use her arms much, and can not perform any over head activities. Physical exam limited due to patient's inability to passively raise arms above  about 90 degrees (specific measurements listed above). Patients pain likely due to arthritis and scar tissue that formed after the surgery due to patient not being able to adequately rehab and recover from procedures. Gave patients exercises to do (arm crawls) while sitting at the table. Discussed continuing her pain medication she has, recommended adding Voltaren gel, and discussed possible cortisone injections in the future if she so desires. Patient agreeable with plan.    I observed and examined the patient with the resident and agree with assessment and plan.  Note reviewed and modified by me.  We were able to demonstrate some chair based exercises - modified Codman series to help her maintain as much ROM as possible. Ila Mcgill, MD

## 2021-05-23 NOTE — Assessment & Plan Note (Signed)
She has advanced shoulder OA from her chronic RC arthropathy.  We worked with her on ways to maximize her ROM and maintain as much function as possilbe. At her age very hesitant to recommend a shoulder replacement,

## 2021-07-02 ENCOUNTER — Telehealth: Payer: Self-pay | Admitting: Cardiovascular Disease

## 2021-07-02 NOTE — Telephone Encounter (Signed)
Spoke with the patient who reports that she has had shortness of breath for several months now. She denies any swelling, weight gain or chest pain. She states that SOB is with exertion. She states that she notices that it is worse after she takes her hydrocodone. She tries not to take it often but has some severe pain all over her body so she needs it at times. I have advised her to follow up with Dr. Joylene Draft in regards to her concerns that medication is causing worsened SOB. Patient with history of obesity hypoventilation that Dr. Acie Fredrickson suspects is the cause of her SOB.  ?Advised to call back if symptoms worsen or any additional symptoms arise. Otherwise keep yearly follow up as scheduled with Dr. Acie Fredrickson in July.  ?

## 2021-07-02 NOTE — Telephone Encounter (Signed)
Pt c/o Shortness Of Breath: STAT if SOB developed within the last 24 hours or pt is noticeably SOB on the phone ? ?1. Are you currently SOB (can you hear that pt is SOB on the phone)?  ?No  ? ?2. How long have you been experiencing SOB?  ?About 6 months  ? ?3. Are you SOB when sitting or when up moving around?  ?When up and moving around ? ?4. Are you currently experiencing any other symptoms?  ? ?No, but patient states she experiences pain all over her body due to arthritis and a back condition. Dr. Joylene Draft prescribed Hydrocodone and patient assumes this may also be causing the SOB. ? ?

## 2021-07-29 ENCOUNTER — Telehealth: Payer: Self-pay | Admitting: Cardiovascular Disease

## 2021-07-29 NOTE — Telephone Encounter (Signed)
Pt c/o BP issue: STAT if pt c/o blurred vision, one-sided weakness or slurred speech ? ?1. What are your last 5 BP readings?  ?155/63 ?173/71 ?189/69 ?163/68 ?168/74 ? ?2. Are you having any other symptoms (ex. Dizziness, headache, blurred vision, passed out)? Dizzy, a little lightheaded.  A little headache.  ? ?3. What is your BP issue? Feels the diastolic number is a little low.   ?

## 2021-07-29 NOTE — Telephone Encounter (Signed)
The patient is complaining of dizziness, lightheaded and a slight headache. The headache has passed. Dizziness and lightheadedness is when she is getting up from a sitting to a standing position.  I am going to have her lay flat for 8-10 min and start orthostatic vital signs.  ? ?Lay:161/63  57 ?Sit: 178/82  61 ?Stand:  187/89  70 ?Stand after 3 mins: 150/68  76 ? ?Patient did not experience dizziness while getting these VS. Medications are correct and she has not missed any doses. CBG was 142 this morning. She has called her PCP about this as well and she is going to see an APP tomorrow (07/30/21) morning. No changes in lifestyle or diet. ED precautions given. ?Patient verbalized understanding and agreement. ?

## 2021-11-02 ENCOUNTER — Encounter: Payer: Self-pay | Admitting: Cardiovascular Disease
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
## 2021-11-03 ENCOUNTER — Encounter: Payer: Self-pay | Admitting: Cardiovascular Disease

## 2021-11-03 ENCOUNTER — Ambulatory Visit (INDEPENDENT_AMBULATORY_CARE_PROVIDER_SITE_OTHER): Payer: Medicare Other | Admitting: Cardiovascular Disease

## 2021-11-03 VITALS — BP 138/80 | HR 59 | Resp 20 | Ht 65.0 in | Wt 281.4 lb

## 2021-11-03 DIAGNOSIS — E662 Morbid (severe) obesity with alveolar hypoventilation: Secondary | ICD-10-CM | POA: Diagnosis not present

## 2021-11-03 DIAGNOSIS — Z9989 Dependence on other enabling machines and devices: Secondary | ICD-10-CM

## 2021-11-03 DIAGNOSIS — I119 Hypertensive heart disease without heart failure: Secondary | ICD-10-CM | POA: Diagnosis not present

## 2021-11-03 DIAGNOSIS — G4733 Obstructive sleep apnea (adult) (pediatric): Secondary | ICD-10-CM | POA: Diagnosis not present

## 2021-11-03 DIAGNOSIS — I5032 Chronic diastolic (congestive) heart failure: Secondary | ICD-10-CM | POA: Diagnosis not present

## 2021-11-03 NOTE — Patient Instructions (Signed)
Medication Instructions:  No changes  *If you need a refill on your cardiac medications before your next appointment, please call your pharmacy*   Lab Work: NONE If you have labs (blood work) drawn today and your tests are completely normal, you will receive your results only by: [place] (if you have MyChart) OR A paper copy in the mail If you have any lab test that is abnormal or we need to change your treatment, we will call you to review the results.   Testing/Procedures: NONE   Follow-Up: At [facility], you and your health needs are our priority.  As part of our continuing mission to provide you with exceptional heart care, we have created designated Provider Care Teams.  These Care Teams include your primary Cardiologist (physician) and Advanced Practice Providers (APPs -  Physician Assistants and Nurse Practitioners) who all work together to provide you with the care you need, when you need it.  We recommend signing up for the patient portal called "MyChart".  Sign up information is provided on this After Visit Summary.  MyChart is used to connect with patients for Virtual Visits (Telemedicine).  Patients are able to view lab/test results, encounter notes, upcoming appointments, etc.  Non-urgent messages can be sent to your provider as well.   To learn more about what you can do with MyChart, go to [email or web address].    Your next appointment:   6 month(s)  The format for your next appointment:   In Person  Provider:  SCOTT WEAVER [facility]  SCOTT WEAVER     Other Instructions NONEMediterranean Diet A Mediterranean diet refers to food and lifestyle choices that are based on the traditions of countries located on the [employer]. It focuses on eating more fruits, vegetables, whole grains, beans, nuts, seeds, and heart-healthy fats, and eating less dairy, meat, eggs, and processed foods with added sugar, salt, and fat. This way of eating has been shown to help  prevent certain conditions and improve outcomes for people who have chronic diseases, like kidney disease and heart disease. What are tips for following this plan? Reading food labels Check the serving size of packaged foods. For foods such as rice and pasta, the serving size refers to the amount of cooked product, not dry. Check the total fat in packaged foods. Avoid foods that have saturated fat or trans fats. Check the ingredient list for added sugars, such as corn syrup. Shopping  Buy a variety of foods that offer a balanced diet, including: Fresh fruits and vegetables (produce). Grains, beans, nuts, and seeds. Some of these may be available in unpackaged forms or large amounts (in bulk). Fresh seafood. Poultry and eggs. Low-fat dairy products. Buy whole ingredients instead of prepackaged foods. Buy fresh fruits and vegetables in-season from local farmers markets. Buy plain frozen fruits and vegetables. If you do not have access to quality fresh seafood, buy precooked frozen shrimp or canned fish, such as tuna, salmon, or sardines. Stock your pantry so you always have certain foods on hand, such as olive oil, canned tuna, canned tomatoes, rice, pasta, and beans. Cooking Cook foods with extra-virgin olive oil instead of using butter or other vegetable oils. Have meat as a side dish, and have vegetables or grains as your main dish. This means having meat in small portions or adding small amounts of meat to foods like pasta or stew. Use beans or vegetables instead of meat in common dishes like chili or lasagna. Experiment with different cooking methods. Try roasting,  broiling, steaming, and sauting vegetables. Add frozen vegetables to soups, stews, pasta, or rice. Add nuts or seeds for added healthy fats and plant protein at each meal. You can add these to yogurt, salads, or vegetable dishes. Marinate fish or vegetables using olive oil, lemon juice, garlic, and fresh herbs. Meal  planning Plan to eat one vegetarian meal one day each week. Try to work up to two vegetarian meals, if possible. Eat seafood two or more times a week. Have healthy snacks readily available, such as: Vegetable sticks with hummus. Greek yogurt. Fruit and nut trail mix. Eat balanced meals throughout the week. This includes: Fruit: 2-3 servings a day. Vegetables: 4-5 servings a day. Low-fat dairy: 2 servings a day. Fish, poultry, or lean meat: 1 serving a day. Beans and legumes: 2 or more servings a week. Nuts and seeds: 1-2 servings a day. Whole grains: 6-8 servings a day. Extra-virgin olive oil: 3-4 servings a day. Limit red meat and sweets to only a few servings a month. Lifestyle  Cook and eat meals together with your family, when possible. Drink enough fluid to keep your urine pale yellow. Be physically active every day. This includes: Aerobic exercise like running or swimming. Leisure activities like gardening, walking, or housework. Get 7-8 hours of sleep each night. If recommended by your health care provider, drink red wine in moderation. This means 1 glass a day for nonpregnant women and 2 glasses a day for men. A glass of wine equals 5 oz (150 mL). What foods should I eat? Fruits Apples. Apricots. Avocado. Berries. Bananas. Cherries. Dates. Figs. Grapes. Lemons. Melon. Oranges. Peaches. Plums. Pomegranate. Vegetables Artichokes. Beets. Broccoli. Cabbage. Carrots. Eggplant. Green beans. Chard. Kale. Spinach. Onions. Leeks. Peas. Squash. Tomatoes. Peppers. Radishes. Grains Whole-grain pasta. Brown rice. Bulgur wheat. Polenta. Couscous. Whole-wheat bread. [place]. Meats and other proteins Beans. Almonds. Sunflower seeds. Pine nuts. Peanuts. [place]. Salmon. Scallops. Shrimp. [place]. Tilapia. Clams. Oysters. Eggs. Poultry without skin. Dairy Low-fat milk. Cheese. Greek yogurt. Fats and oils Extra-virgin olive oil. Avocado oil. Grapeseed oil. Beverages Water. Red wine.  Herbal tea. Sweets and desserts Greek yogurt with honey. Baked apples. Poached pears. Trail mix. Seasonings and condiments Basil. Cilantro. Coriander. Cumin. Mint. Parsley. Sage. Rosemary. Tarragon. Garlic. Oregano. Thyme. Pepper. Balsamic vinegar. Tahini. Hummus. Tomato sauce. Olives. Mushrooms. The items listed above may not be a complete list of foods and beverages you can eat. Contact a dietitian for more information. What foods should I limit? This is a list of foods that should be eaten rarely or only on special occasions. Fruits Fruit canned in syrup. Vegetables Deep-fried potatoes (french fries). Grains Prepackaged pasta or rice dishes. Prepackaged cereal with added sugar. Prepackaged snacks with added sugar. Meats and other proteins Beef. Pork. Lamb. Poultry with skin. Hot dogs. Berniece Salines. Dairy Ice cream. Sour cream. Whole milk. Fats and oils Butter. Canola oil. Vegetable oil. Beef fat (tallow). Lard. Beverages Juice. Sugar-sweetened soft drinks. Beer. Liquor and spirits. Sweets and desserts Cookies. Cakes. Pies. Candy. Seasonings and condiments Mayonnaise. Pre-made sauces and marinades. The items listed above may not be a complete list of foods and beverages you should limit. Contact a dietitian for more information. Summary The Mediterranean diet includes both food and lifestyle choices. Eat a variety of fresh fruits and vegetables, beans, nuts, seeds, and whole grains. Limit the amount of red meat and sweets that you eat. If recommended by your health care provider, drink red wine in moderation. This means 1 glass a day for nonpregnant women  and 2 glasses a day for men. A glass of wine equals 5 oz (150 mL). This information is not intended to replace advice given to you by your health care provider. Make sure you discuss any questions you have with your health care provider. Document Revised: 04/28/2019 Document Reviewed: 02/23/2019 Elsevier Patient Education  [place] Eating Plan DASH stands for Dietary Approaches to Stop Hypertension. The DASH eating plan is a healthy eating plan that has been shown to: Reduce high blood pressure (hypertension). Reduce your risk for type 2 diabetes, heart disease, and stroke. Help with weight loss. What are tips for following this plan? Reading food labels Check food labels for the amount of salt (sodium) per serving. Choose foods with less than 5 percent of the Daily Value of sodium. Generally, foods with less than 300 milligrams (mg) of sodium per serving fit into this eating plan. To find whole grains, look for the word "whole" as the first word in the ingredient list. Shopping Buy products labeled as "low-sodium" or "no salt added." Buy fresh foods. Avoid canned foods and pre-made or frozen meals. Cooking Avoid adding salt when cooking. Use salt-free seasonings or herbs instead of table salt or sea salt. Check with your health care provider or pharmacist before using salt substitutes. Do not fry foods. Cook foods using healthy methods such as baking, boiling, grilling, roasting, and broiling instead. Cook with heart-healthy oils, such as olive, canola, avocado, soybean, or sunflower oil. Meal planning  Eat a balanced diet that includes: 4 or more servings of fruits and 4 or more servings of vegetables each day. Try to fill one-half of your plate with fruits and vegetables. 6-8 servings of whole grains each day. Less than 6 oz (170 g) of lean meat, poultry, or fish each day. A 3-oz (85-g) serving of meat is about the same size as a deck of cards. One egg equals 1 oz (28 g). 2-3 servings of low-fat dairy each day. One serving is 1 cup (237 mL). 1 serving of nuts, seeds, or beans 5 times each week. 2-3 servings of heart-healthy fats. Healthy fats called omega-3 fatty acids are found in foods such as walnuts, flaxseeds, fortified milks, and eggs. These fats are also found in cold-water fish, such as  sardines, salmon, and mackerel. Limit how much you eat of: Canned or prepackaged foods. Food that is high in trans fat, such as some fried foods. Food that is high in saturated fat, such as fatty meat. Desserts and other sweets, sugary drinks, and other foods with added sugar. Full-fat dairy products. Do not salt foods before eating. Do not eat more than 4 egg yolks a week. Try to eat at least 2 vegetarian meals a week. Eat more home-cooked food and less restaurant, buffet, and fast food. Lifestyle When eating at a restaurant, ask that your food be prepared with less salt or no salt, if possible. If you drink alcohol: Limit how much you use to: 0-1 drink a day for women who are not pregnant. 0-2 drinks a day for men. Be aware of how much alcohol is in your drink. In the [place] one drink equals one 12 oz bottle of beer (355 mL), one 5 oz glass of wine (148 mL), or one 1 oz glass of hard liquor (44 mL). General information Avoid eating more than 2,300 mg of salt a day. If you have hypertension, you may need to reduce your sodium intake to 1,500 mg a day. Work with  your health care provider to maintain a healthy body weight or to lose weight. Ask what an ideal weight is for you. Get at least 30 minutes of exercise that causes your heart to beat faster (aerobic exercise) most days of the week. Activities may include walking, swimming, or biking. Work with your health care provider or dietitian to adjust your eating plan to your individual calorie needs. What foods should I eat? Fruits All fresh, dried, or frozen fruit. Canned fruit in natural juice (without added sugar). Vegetables Fresh or frozen vegetables (raw, steamed, roasted, or grilled). Low-sodium or reduced-sodium tomato and vegetable juice. Low-sodium or reduced-sodium tomato sauce and tomato paste. Low-sodium or reduced-sodium canned vegetables. Grains Whole-grain or whole-wheat bread. Whole-grain or whole-wheat pasta. Brown rice.  [place]. Bulgur. Whole-grain and low-sodium cereals. Pita bread. Low-fat, low-sodium crackers. Whole-wheat flour tortillas. Meats and other proteins Skinless chicken or [place]. Ground chicken or [place]. Pork with fat trimmed off. Fish and seafood. Egg whites. Dried beans, peas, or lentils. Unsalted nuts, nut butters, and seeds. Unsalted canned beans. Lean cuts of beef with fat trimmed off. Low-sodium, lean precooked or cured meat, such as sausages or meat loaves. Dairy Low-fat (1%) or fat-free (skim) milk. Reduced-fat, low-fat, or fat-free cheeses. Nonfat, low-sodium ricotta or cottage cheese. Low-fat or nonfat yogurt. Low-fat, low-sodium cheese. Fats and oils Soft margarine without trans fats. Vegetable oil. Reduced-fat, low-fat, or light mayonnaise and salad dressings (reduced-sodium). Canola, safflower, olive, avocado, soybean, and sunflower oils. Avocado. Seasonings and condiments Herbs. Spices. Seasoning mixes without salt. Other foods Unsalted popcorn and pretzels. Fat-free sweets. The items listed above may not be a complete list of foods and beverages you can eat. Contact a dietitian for more information. What foods should I avoid? Fruits Canned fruit in a light or heavy syrup. Fried fruit. Fruit in cream or butter sauce. Vegetables Creamed or fried vegetables. Vegetables in a cheese sauce. Regular canned vegetables (not low-sodium or reduced-sodium). Regular canned tomato sauce and paste (not low-sodium or reduced-sodium). Regular tomato and vegetable juice (not low-sodium or reduced-sodium). Angie Fava. Olives. Grains Baked goods made with fat, such as croissants, muffins, or some breads. Dry pasta or rice meal packs. Meats and other proteins Fatty cuts of meat. Ribs. Fried meat. Berniece Salines. Bologna, salami, and other precooked or cured meats, such as sausages or meat loaves. Fat from the back of a pig (fatback). Bratwurst. Salted nuts and seeds. Canned beans with added salt. Canned or  smoked fish. Whole eggs or egg yolks. Chicken or [place] with skin. Dairy Whole or 2% milk, cream, and half-and-half. Whole or full-fat cream cheese. Whole-fat or sweetened yogurt. Full-fat cheese. Nondairy creamers. Whipped toppings. Processed cheese and cheese spreads. Fats and oils Butter. Stick margarine. Lard. Shortening. Ghee. Bacon fat. Tropical oils, such as coconut, palm kernel, or palm oil. Seasonings and condiments Onion salt, garlic salt, seasoned salt, table salt, and sea salt. Worcestershire sauce. Tartar sauce. Barbecue sauce. Teriyaki sauce. Soy sauce, including reduced-sodium. Steak sauce. Canned and packaged gravies. Fish sauce. Oyster sauce. Cocktail sauce. Store-bought horseradish. Ketchup. Mustard. Meat flavorings and tenderizers. Bouillon cubes. Hot sauces. Pre-made or packaged marinades. Pre-made or packaged taco seasonings. Relishes. Regular salad dressings. Other foods Salted popcorn and pretzels. The items listed above may not be a complete list of foods and beverages you should avoid. Contact a dietitian for more information. Where to find more information National Heart, Lung, and Blood Institute: https://wilson-eaton.com/ American Heart Association: www.heart.org Academy of Nutrition and Dietetics: www.eatright.[place]: www.kidney.org Summary  The DASH eating plan is a healthy eating plan that has been shown to reduce high blood pressure (hypertension). It may also reduce your risk for type 2 diabetes, heart disease, and stroke. When on the DASH eating plan, aim to eat more fresh fruits and vegetables, whole grains, lean proteins, low-fat dairy, and heart-healthy fats. With the DASH eating plan, you should limit salt (sodium) intake to 2,300 mg a day. If you have hypertension, you may need to reduce your sodium intake to 1,500 mg a day. Work with your health care provider or dietitian to adjust your eating plan to your individual calorie needs. This  information is not intended to replace advice given to you by your health care provider. Make sure you discuss any questions you have with your health care provider. Document Revised: 02/24/2019 Document Reviewed: 02/24/2019 Elsevier Patient Education  [place]

## 2021-12-28 ENCOUNTER — Other Ambulatory Visit: Payer: Self-pay | Admitting: Cardiovascular Disease

## 2022-02-05 ENCOUNTER — Other Ambulatory Visit: Payer: Self-pay | Admitting: Nurse Practitioner

## 2022-02-05 DIAGNOSIS — Z1231 Encounter for screening mammogram for malignant neoplasm of breast: Secondary | ICD-10-CM

## 2022-02-13 ENCOUNTER — Other Ambulatory Visit (HOSPITAL_BASED_OUTPATIENT_CLINIC_OR_DEPARTMENT_OTHER): Payer: Self-pay

## 2022-02-13 MED ORDER — COMIRNATY 30 MCG/0.3ML IM SUSY
PREFILLED_SYRINGE | INTRAMUSCULAR | 0 refills | Status: DC
  Filled 2022-02-13: qty 0.3, 1d supply, fill #0

## 2022-02-19 ENCOUNTER — Encounter: Payer: Self-pay | Admitting: Neurology

## 2022-02-19 ENCOUNTER — Ambulatory Visit (INDEPENDENT_AMBULATORY_CARE_PROVIDER_SITE_OTHER): Payer: Medicare Other | Admitting: Neurology

## 2022-02-19 VITALS — BP 162/53 | HR 64 | Ht 65.0 in | Wt 281.0 lb

## 2022-02-19 DIAGNOSIS — E662 Morbid (severe) obesity with alveolar hypoventilation: Secondary | ICD-10-CM | POA: Diagnosis not present

## 2022-02-19 DIAGNOSIS — Z853 Personal history of malignant neoplasm of breast: Secondary | ICD-10-CM | POA: Diagnosis not present

## 2022-02-19 DIAGNOSIS — N184 Chronic kidney disease, stage 4 (severe): Secondary | ICD-10-CM | POA: Insufficient documentation

## 2022-02-19 DIAGNOSIS — I5032 Chronic diastolic (congestive) heart failure: Secondary | ICD-10-CM | POA: Diagnosis not present

## 2022-02-19 DIAGNOSIS — G4730 Sleep apnea, unspecified: Secondary | ICD-10-CM | POA: Diagnosis not present

## 2022-02-19 DIAGNOSIS — C50912 Malignant neoplasm of unspecified site of left female breast: Secondary | ICD-10-CM
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
## 2022-03-09 ENCOUNTER — Encounter: Payer: Self-pay | Admitting: Neurology

## 2022-03-10 ENCOUNTER — Telehealth: Payer: Self-pay | Admitting: Neurology

## 2022-03-10 NOTE — Telephone Encounter (Signed)
HST- [employer] medicare/medicaid.   Patient is scheduled at [facility] for 03/31/22 at 1 pm.  Mailed packet to the patient to schedule.

## 2022-03-20 ENCOUNTER — Other Ambulatory Visit: Payer: Self-pay | Admitting: Cardiovascular Disease

## 2022-03-31 ENCOUNTER — Ambulatory Visit (INDEPENDENT_AMBULATORY_CARE_PROVIDER_SITE_OTHER): Payer: Medicare Other | Admitting: Neurology

## 2022-03-31 DIAGNOSIS — G4733 Obstructive sleep apnea (adult) (pediatric): Secondary | ICD-10-CM | POA: Diagnosis not present

## 2022-03-31 DIAGNOSIS — G4730 Sleep apnea, unspecified: Secondary | ICD-10-CM

## 2022-03-31 DIAGNOSIS — E662 Morbid (severe) obesity with alveolar hypoventilation: Secondary | ICD-10-CM

## 2022-03-31 DIAGNOSIS — Z853 Personal history of malignant neoplasm of breast: Secondary | ICD-10-CM

## 2022-03-31 DIAGNOSIS — I5032 Chronic diastolic (congestive) heart failure: Secondary | ICD-10-CM

## 2022-04-07 ENCOUNTER — Ambulatory Visit
Admission: RE | Admit: 2022-04-07 | Discharge: 2022-04-07 | Disposition: A | Discharge status: Home / Self Care | Payer: Medicare Other | Source: Ambulatory Visit | Attending: Nurse Practitioner | Admitting: Nurse Practitioner

## 2022-04-07 DIAGNOSIS — Z1231 Encounter for screening mammogram for malignant neoplasm of breast: Secondary | ICD-10-CM

## 2022-04-08 ENCOUNTER — Telehealth: Payer: Self-pay | Admitting: Neurology

## 2022-04-08 DIAGNOSIS — G4733 Obstructive sleep apnea (adult) (pediatric): Secondary | ICD-10-CM

## 2022-04-08 NOTE — Telephone Encounter (Signed)
This patient saw Dr. Brett Fairy for sleep evaluation.  Patient has been compliant on AutoPap therapy.  I reviewed her home sleep test on Dr. Edwena Felty behalf, study from 04/02/2022.  Patient has overall mild sleep apnea.  Since she has done well with AutoPap therapy and should qualify for new equipment I would like to write for a new machine.  Please reinforce full compliance and the need for a follow-up appointment in sleep clinic within 1 to 3 months of set up date.  She can be scheduled with Dr. Brett Fairy or one of our nurse practitioners.  Please find out if she has a preference regarding her DME company, she can continue with her existing company or establish with a new company if she prefers.

## 2022-04-08 NOTE — Procedures (Signed)
[facility]  HOME SLEEP TEST (Watch PAT) REPORT  STUDY DATE: 04/02/22  DOB: 1934/07/23  MRN: [id number]  ORDERING CLINICIAN: Star Age, MD, PhD - study interpreted on behalf of Dr. Brett Fairy   REFERRING CLINICIAN: Crist Infante, MD (PCP), Dr. Brett Fairy (Sleep)  CLINICAL INFORMATION/HISTORY: 88 yo female with a history of breast cancer, CHF, CKD, HTN, allergies, gout, DM, GERD, scoliosis, arthritis, bradycardia and morbid obesity, who has been on PAP therapy for years with compliance and good apnea control. She qualifies for new equipment.   Epworth sleepiness score: 10/24.  BMI: 46.8 kg/m  FINDINGS:   Sleep Summary:   Total Recording Time (hours, min): 6 hours, 54 min  Total Sleep Time (hours, min):  5 hours, 36 min  Percent REM (%):    16.3%   Respiratory Indices:   Calculated pAHI (per hour):  10.8/hour         REM pAHI:    32.4/hour       NREM pAHI: 6.6/hour  Central pAHI: 0/hour  Oxygen Saturation Statistics:    Oxygen Saturation (%) Mean: 94%   Minimum oxygen saturation (%):                 83%   O2 Saturation Range (%): 83 - 98%    O2 Saturation (minutes) <=88%: 4.8 min  Pulse Rate Statistics:   Pulse Mean (bpm):    62/min    Pulse Range (52 - 99/min)   IMPRESSION: OSA (obstructive sleep apnea)   RECOMMENDATION:  This home sleep test demonstrates overall mild obstructive sleep apnea with a total AHI of 10.8/hour and O2 nadir of 83%. The snore and position channel were not available during this test.  The patient is established on PAP therapy and she has had good results with her AutoPap, she should qualify for new equipment.  I recommend home AutoPap therapy with a new machine. A full night, in-lab PAP titration study may aid in improving proper treatment settings and with mask fit, if needed, down the road. Alternative treatments may include weight loss (where appropriate) along with avoidance of the supine sleep position (if  possible), or an oral appliance in appropriate candidates.   Please note that untreated obstructive sleep apnea may carry additional perioperative morbidity. Patients with significant obstructive sleep apnea should receive perioperative PAP therapy and the surgeons and particularly the anesthesiologist should be informed of the diagnosis and the severity of the sleep disordered breathing. The patient should be cautioned not to drive, work at heights, or operate dangerous or heavy equipment when tired or sleepy. Review and reiteration of good sleep hygiene measures should be pursued with any patient. Other causes of the patient's symptoms, including circadian rhythm disturbances, an underlying mood disorder, medication effect and/or an underlying medical problem cannot be ruled out based on this test. Clinical correlation is recommended.  The patient and her referring provider will be notified of the test results. The patient will be seen in follow up in sleep clinic at [facility], as necessary.  I certify that I have reviewed the raw data recording prior to the issuance of this report in accordance with the standards of the American Academy of Sleep Medicine (AASM).  INTERPRETING PHYSICIAN:   Star Age, MD, PhD Medical Director, [facility]) [place], ABPN (Neurology and Sleep)   [facility] [address], [place], [place] [address] [phone]

## 2022-04-08 NOTE — Progress Notes (Signed)
See procedure note.

## 2022-04-09 NOTE — Telephone Encounter (Signed)
Called pt at [phone]. LVM for pt to call about results.

## 2022-04-09 NOTE — Telephone Encounter (Signed)
Took call from phone staff and spoke with pt. Relayed results per Dr. Guadelupe Sabin note. Her DME is [facility]. I sent community message that order placed to them. Scheduled initial cpap f/u for 06/30/22 at 3:30pm with Dr. Brett Fairy.

## 2022-05-14 ENCOUNTER — Telehealth: Payer: Self-pay

## 2022-05-14 NOTE — Telephone Encounter (Signed)
Patient received recall letter and wanted to know if necessary she return for visit.  TW indicated at her 04/18/2020 [facility] visit to "Follow up in 2 years for CE ".  I called patient back 3 times but her phone number is not accepting calls at this time.

## 2022-05-14 NOTE — Telephone Encounter (Signed)
Spoke with patient and informed her TW recommended visit for this year.  She agrees and message sent to appt desk to schedule. She knows they will call her to arrange.

## 2022-06-08 ENCOUNTER — Encounter: Payer: Self-pay | Admitting: Cardiovascular Disease
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
## 2022-06-09 ENCOUNTER — Ambulatory Visit: Payer: Medicare Other | Attending: Cardiovascular Disease | Admitting: Cardiovascular Disease

## 2022-06-09 ENCOUNTER — Encounter: Payer: Self-pay | Admitting: Cardiovascular Disease

## 2022-06-09 VITALS — BP 126/70 | HR 72 | Ht 64.0 in | Wt 275.0 lb

## 2022-06-09 DIAGNOSIS — I10 Essential (primary) hypertension: Secondary | ICD-10-CM

## 2022-06-09 DIAGNOSIS — Z6841 Body Mass Index (BMI) 40.0 and over, adult: Secondary | ICD-10-CM

## 2022-06-09 MED ORDER — CARVEDILOL 12.5 MG PO TABS: 12.5000 mg | ORAL_TABLET | Freq: Two times a day (BID) | ORAL | 3 refills | Status: DC

## 2022-06-09 NOTE — Patient Instructions (Signed)
Medication Instructions:  None  *If you need a refill on your cardiac medications before your next appointment, please call your pharmacy*   Lab Work: none If you have labs (blood work) drawn today and your tests are completely normal, you will receive your results only by: [place] (if you have MyChart) OR A paper copy in the mail If you have any lab test that is abnormal or we need to change your treatment, we will call you to review the results.   Testing/Procedures: none   Follow-Up: At [facility], you and your health needs are our priority.  As part of our continuing mission to provide you with exceptional heart care, we have created designated Provider Care Teams.  These Care Teams include your primary Cardiologist (physician) and Advanced Practice Providers (APPs -  Physician Assistants and Nurse Practitioners) who all work together to provide you with the care you need, when you need it.  We recommend signing up for the patient portal called "MyChart".  Sign up information is provided on this After Visit Summary.  MyChart is used to connect with patients for Virtual Visits (Telemedicine).  Patients are able to view lab/test results, encounter notes, upcoming appointments, etc.  Non-urgent messages can be sent to your provider as well.   To learn more about what you can do with MyChart, go to [email or web address].    Your next appointment:   1 year(s)  Provider:   Mertie Moores, MD

## 2022-06-16 ENCOUNTER — Encounter: Payer: Self-pay | Admitting: Nurse Practitioner

## 2022-06-16 ENCOUNTER — Ambulatory Visit (INDEPENDENT_AMBULATORY_CARE_PROVIDER_SITE_OTHER): Payer: Medicare Other | Admitting: Nurse Practitioner

## 2022-06-16 VITALS — BP 138/72 | HR 73 | Resp 22 | Ht 62.99 in | Wt 275.1 lb

## 2022-06-16 DIAGNOSIS — Z01419 Encounter for gynecological examination (general) (routine) without abnormal findings: Secondary | ICD-10-CM

## 2022-06-16 DIAGNOSIS — Z9189 Other specified personal risk factors, not elsewhere classified: Secondary | ICD-10-CM | POA: Diagnosis not present

## 2022-06-16 DIAGNOSIS — Z780 Asymptomatic menopausal state: Secondary | ICD-10-CM

## 2022-06-16 NOTE — Progress Notes (Signed)
   Vicki [name] 11-02-1934 [id number]   History:  88 y.o. [id number] presents for breast and pelvic exam without GYN complaints. Postmenopausual - no HRT, no bleeding. 2010 Left breast cancer managed with lumpectomy and radiation. Normal pap history. H/O CHF, HTN, HLD, CKD.   Gynecologic History No LMP recorded. Patient is postmenopausal.   Sexually active: No  Health Maintenance Last Pap: 05/07/2011 Last mammogram: 04/07/2022. Results were: Normal Last colonoscopy: 2017. Results were: normal Last Dexa: PCP, we do not have reports  Past medical history, past surgical history, family history and social history were all reviewed and documented in the EPIC chart. Married - husband is 86. 6 children.   ROS:  A ROS was performed and pertinent positives and negatives are included.  Exam:  Vitals:   06/16/22 1355  BP: 138/72  Pulse: 73  Resp: (!) 22  SpO2: 95%  Weight: 275 lb 1.8 oz (124.8 kg)  Height: 5' 2.99" (1.6 m)    Body mass index is 48.75 kg/m.  General appearance:  Normal Thyroid:  Symmetrical, normal in size, without palpable masses or nodularity. Respiratory  Auscultation:  Clear without wheezing or rhonchi Cardiovascular  Auscultation:  Regular rate, without rubs, murmurs or gallops  Edema/varicosities:  Not grossly evident Abdominal  Soft,nontender, without masses, guarding or rebound.  Liver/spleen:  No organomegaly noted  Hernia:  None appreciated  Skin  Inspection:  Grossly normal   Breasts: Examined lying and sitting.   Right: Without masses, retractions, discharge or axillary adenopathy.   Left: Without masses, retractions, discharge or axillary adenopathy. Gentitourinary   Inguinal/mons:  Normal without inguinal adenopathy  External genitalia:  Normal  BUS/Urethra/Skene's glands:  Normal  Vagina:  Normal  Cervix:  Normal  Uterus: Difficult to palpate due to body habitus but no gross masses or tenderness  Adnexa/parametria:     Rt: Without masses or  tenderness.   Lt: Without masses or tenderness.  Anus and perineum: Normal  Digital rectal exam: Declined  Assessment/Plan:  88 y.o. [id number] for breast and pelvic exam.   Encounter for breast and pelvic examination - Education provided on SBEs, importance of preventative screenings, current guidelines, high calcium diet, regular exercise, and multivitamin daily. Labs done elsewhere.  Postmenopausal - no HRT, no bleeding  Screening for cervical cancer - Normal Pap history.  No longer screening per guidelines.  Screening for breast cancer - 2010 left breast cancer. Continue annual screenings.  Normal breast exam today.  Screening for colon cancer -2017 colonoscopy.  Will repeat at GI's recommended interval.   Follow up in 2 years for breast and pelvic exam.     [name] [facility], 2:24 PM 06/16/2022

## 2022-06-30 ENCOUNTER — Ambulatory Visit (INDEPENDENT_AMBULATORY_CARE_PROVIDER_SITE_OTHER): Payer: Medicare Other | Admitting: Neurology

## 2022-06-30 ENCOUNTER — Encounter: Payer: Self-pay | Admitting: Neurology

## 2022-06-30 VITALS — BP 167/76 | HR 55 | Ht 64.0 in | Wt 279.0 lb

## 2022-06-30 DIAGNOSIS — C50912 Malignant neoplasm of unspecified site of left female breast: Secondary | ICD-10-CM | POA: Diagnosis not present

## 2022-06-30 DIAGNOSIS — I5032 Chronic diastolic (congestive) heart failure: Secondary | ICD-10-CM

## 2022-06-30 DIAGNOSIS — G4733 Obstructive sleep apnea (adult) (pediatric): Secondary | ICD-10-CM

## 2022-06-30 DIAGNOSIS — N184 Chronic kidney disease, stage 4 (severe): Secondary | ICD-10-CM
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
## 2022-10-23 ENCOUNTER — Telehealth: Payer: Self-pay | Admitting: Cardiovascular Disease

## 2022-10-23 NOTE — Telephone Encounter (Signed)
Returned call to patient who states Vicki [name] noticed wheezing and SPB about a month ago. States Vicki [name] is really short-winded when getting up in the night to use the bathroom. Denies edema in LE's. States BP has been in the 118 range over 70's. Denies CP or cardiac symptoms. Vicki [name] states that Vicki [name] hasn't had an albuterol inhaler in quite some time, but Vicki [name] feels like it would help her. Vicki [name] is going to call PCP office for this. Vicki [name] is requesting to see Dr Elease Hashimoto for this too though, stating it would make her feel better. Open slot for 10/27/22 @3pm .

## 2022-10-23 NOTE — Telephone Encounter (Signed)
Pt c/o Shortness Of Breath: STAT if SOB developed within the last 24 hours or pt is noticeably SOB on the phone  1. Are you currently SOB (can you hear that pt is SOB on the phone)? No   2. How long have you been experiencing SOB? About a month   3. Are you SOB when sitting or when up moving around? Moving around   4. Are you currently experiencing any other symptoms? No   Pt states she has been wheezing and SOB for about a month.  Please advise.

## 2022-10-26 ENCOUNTER — Encounter: Payer: Self-pay | Admitting: Cardiovascular Disease
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
## 2022-10-27 ENCOUNTER — Ambulatory Visit: Payer: Medicare Other | Attending: Cardiovascular Disease | Admitting: Cardiovascular Disease

## 2022-10-27 ENCOUNTER — Encounter: Payer: Self-pay | Admitting: Cardiovascular Disease

## 2022-10-27 VITALS — BP 139/64 | HR 64 | Ht 66.0 in | Wt 274.2 lb

## 2022-10-27 DIAGNOSIS — I10 Essential (primary) hypertension: Secondary | ICD-10-CM | POA: Diagnosis not present

## 2022-10-27 DIAGNOSIS — R0600 Dyspnea, unspecified: Secondary | ICD-10-CM

## 2022-10-27 NOTE — Patient Instructions (Signed)
Medication Instructions:  Your physician recommends that you continue on your current medications as directed. Please refer to the Current Medication list given to you today.  *If you need a refill on your cardiac medications before your next appointment, please call your pharmacy*  Lab Work: NONE If you have labs (blood work) drawn today and your tests are completely normal, you will receive your results only by: MyChart Message (if you have MyChart) OR A paper copy in the mail If you have any lab test that is abnormal or we need to change your treatment, we will call you to review the results.  Testing/Procedures: ECHO Your physician has requested that you have an echocardiogram. Echocardiography is a painless test that uses sound waves to create images of your heart. It provides your doctor with information about the size and shape of your heart and how well your heart's chambers and valves are working. This procedure takes approximately one hour. There are no restrictions for this procedure. Please do NOT wear cologne, perfume, aftershave, or lotions (deodorant is allowed). Please arrive 15 minutes prior to your appointment time.  Follow-Up: At [facility], you and your health needs are our priority.  As part of our continuing mission to provide you with exceptional heart care, we have created designated Provider Care Teams.  These Care Teams include your primary Cardiologist (physician) and Advanced Practice Providers (APPs -  Physician Assistants and Nurse Practitioners) who all work together to provide you with the care you need, when you need it.  Your next appointment:   4 month(s)  Provider:   Alben Spittle, PA or Sw[place], NP

## 2022-11-19 ENCOUNTER — Ambulatory Visit (HOSPITAL_COMMUNITY): Payer: Medicare Other | Attending: Internal Medicine

## 2022-11-19 DIAGNOSIS — I10 Essential (primary) hypertension: Secondary | ICD-10-CM | POA: Insufficient documentation

## 2022-11-19 DIAGNOSIS — R0600 Dyspnea, unspecified: Secondary | ICD-10-CM | POA: Insufficient documentation

## 2022-11-19 DIAGNOSIS — R0609 Other forms of dyspnea: Secondary | ICD-10-CM | POA: Diagnosis not present

## 2022-11-19 LAB — ECHOCARDIOGRAM COMPLETE
Area-P 1/2: 3.31 cm2
Calc EF: 68 %
P 1/2 time: 597 msec
S' Lateral: 2.5 cm
Single Plane A2C EF: 67 %
Single Plane A4C EF: 67.7 %

## 2023-01-27 ENCOUNTER — Ambulatory Visit: Payer: Medicaid Other | Admitting: Physician Assistant

## 2023-02-09 ENCOUNTER — Other Ambulatory Visit: Payer: Self-pay | Admitting: Internal Medicine

## 2023-02-09 DIAGNOSIS — R413 Other amnesia: Secondary | ICD-10-CM

## 2023-03-03 ENCOUNTER — Ambulatory Visit
Admission: RE | Admit: 2023-03-03 | Discharge: 2023-03-03 | Disposition: A | Discharge status: Home / Self Care | Payer: 59 | Source: Ambulatory Visit | Attending: Internal Medicine | Admitting: Internal Medicine

## 2023-03-03 DIAGNOSIS — R413 Other amnesia: Secondary | ICD-10-CM

## 2023-05-26 ENCOUNTER — Ambulatory Visit: Payer: 59 | Admitting: Neurology

## 2023-05-31 ENCOUNTER — Encounter: Payer: Self-pay | Admitting: Neurology

## 2023-05-31 ENCOUNTER — Ambulatory Visit (INDEPENDENT_AMBULATORY_CARE_PROVIDER_SITE_OTHER): Payer: 59 | Admitting: Neurology

## 2023-05-31 VITALS — Ht 64.0 in | Wt 268.0 lb

## 2023-05-31 DIAGNOSIS — N184 Chronic kidney disease, stage 4 (severe): Secondary | ICD-10-CM

## 2023-05-31 DIAGNOSIS — I5032 Chronic diastolic (congestive) heart failure: Secondary | ICD-10-CM | POA: Diagnosis not present

## 2023-05-31 DIAGNOSIS — R41844 Frontal lobe and executive function deficit: Secondary | ICD-10-CM | POA: Insufficient documentation

## 2023-05-31 DIAGNOSIS — R413 Other amnesia: Secondary | ICD-10-CM | POA: Insufficient documentation

## 2023-05-31 DIAGNOSIS — E66813 Obesity, class 3: Secondary | ICD-10-CM

## 2023-05-31 DIAGNOSIS — C50912 Malignant neoplasm of unspecified site of left female breast: Secondary | ICD-10-CM

## 2023-05-31 DIAGNOSIS — Z6841 Body Mass Index (BMI) 40.0 and over, adult: Secondary | ICD-10-CM

## 2023-05-31 NOTE — Progress Notes (Signed)
 Provider:  Melvyn Novas, MD  Primary Care Physician:  Rodrigo Ran, MD [address] [place] [address]     Referring Provider: Rodrigo Ran, Md [address] [place],  [place] [address]          Chief Complaint according to patient   Patient presents with:                HISTORY OF PRESENT ILLNESS:  Vicki [name] is a 89 y.o. female patient who is here for  a new Problem  of Memory loss as  evident to the patient's daughter [place]. The patient's daughter had noticed that there were tendencies to repeat herself with her mother, she has noticed short-term memory impairment the immediate recall of events of words to be disrupted, but the memory for the middle and long range has been intact.  I have followed closely and [name] for more than a decade by now for sleep apnea and today's visit is a month earlier than our routine visit that I received additional information through the primary care physician, Dr. Guillermina City, MD, dated 11-4 2024.  Short-term memory was here again the main concern at that the patient is aware that she repeats herself more.  He listed her current medications and her past medical history which I briefly repeat here she has a history of gout, GERD, obesity, OSA on CPAP, osteoarthritis, diabetes mellitus type 2, and she has hypertension and chronic hematuria.  Vitamin D has been supplemented she usually uses her CPAP nightly added 11 cm water pressure setting, she is taking colchicine for gout, carvedilol, she takes vitamin D 3 and calcium and a supplement.  The baby aspirin, and albuterol inhaler, olmesartan for hypertension, and prednisone as directed.     As to her family history there is no added information about dementia in the family.  Mother died at age 76, may have had dementia , but wasn't dx as such.   She already showed mild -moderate generalized atrophy in 2005/    She was started on Aricept, Donepezil at night.  Dr Waynard Edwards  ordered an MRI:  IMPRESSION: 1. Mildly motion degraded exam. 2. No evidence of an acute intracranial abnormality. 3. Mild chronic small vessel ischemic changes within the cerebral white matter, new from the prior brain MRI of 10/26/2003. 4. Mild-to-moderate generalized cerebral atrophy. 5. Incompletely assessed cervical spondylosis. Similar to the prior cervical spine MRI of 09/06/2018, a C3-C4 posterior disc osteophyte complex contributes to moderate/severe spinal canal stenosis (with spinal cord flattening).     Electronically Signed   By: Jackey Loge D.O.   On: 03/05/2023 08:58      05/31/2023    1:37 PM  Montreal Cognitive Assessment   Visuospatial/ Executive (0/5) 2  Naming (0/3) 2  Attention: Read list of digits (0/2) 1  Attention: Read list of letters (0/1) 1  Attention: Serial 7 subtraction starting at 100 (0/3) 0  Language: Repeat phrase (0/2) 2  Language : Fluency (0/1) 0  Abstraction (0/2) 1  Delayed Recall (0/5) 1  Orientation (0/6) 6  Total 16    Should have been MMSE after first row of questions:       No data to display          Dr Perini's office note documented 27/ 30  points.    TSH and other blood tests were ordeed.  Galantamine was ordered, not aricept.  Last visit ;   Vicki [name] is a 89 y.o. female patient who is here for revisit 06/30/2022 for  CPAP follow up, new machine .  Chief concern according to patient :  no changes in well being.    I have the opportunity today to see Mrs. Brockway in a revisit she is an 89 year old very spry African-American female with a history of breast cancer, congestive heart failure, chronic kidney disease, hypertension, allergies, gout, diabetes mellitus, GERD, scoliosis, arthritis bradycardia and morbid obesity she has been on CPAP therapy for many years with good compliance and apnea control.  She has a new home sleep test on 04-02-2022 to see if she needs to continue her therapy and to get  new equipment.  The patient had an overall apnea-hypopnea index of 10.8 which is actually mild sleep apnea but it was strongly REM sleep dependent.  This form of apnea is usually a hypopnea sometimes and obesity related hypoventilation.  She also had frequent short desaturations and oxygen.  She is qualified for a new auto CPAP therapy which was ordered by my colleague Dr. Frances Furbish during my absence. Compliance is 100% for the last 30 days and for 7 hours and 44 minutes on average each night.  She has received the machine with a setting of pressure between 5 and 13 cm water was 2 cm EPR and her residual AHI is 1.0/h which is excellent.  She has no central apneas arising.  The 95th percentile pressure is 10.6 cm water.  She does have moderate air leakage.   I will encourage her to continue using the CPAP.  The patient endorsed today still a high level of fatigue which is likely explained by her comorbidities.  Her Epworth sleepiness score was endorsed at 7 points, the fatigue severity scale at 50 points, the geriatric depression score at 3 out of 15 points.  05/31/2023 for .     Fam Hx:  no dementia known.    Social HX l Lives with spouse, no pets. Non smoker non drinker.  HS graduate , worked as Economist.  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    Review of Systems: Out of a complete 14 system review, the patient complains of only the following symptoms, and all other reviewed systems are negative.:   Social History   Socioeconomic History   Marital status: Married    Spouse name: Not on file   Number of children: Not on file   Years of education: Not on file   Highest education level: Not on file  Occupational History   Not on file  Tobacco Use   Smoking status: Former    Current packs/day: 0.00    Types: Cigarettes    Quit date: 04/06/1961    Years since quitting: 62.1   Smokeless tobacco: Never  Vaping Use   Vaping status: Never Used   Substance and Sexual Activity   Alcohol use: No   Drug use: No   Sexual activity: Not Currently    Birth control/protection: Post-menopausal    Comment: 1st intercourse 59 yo-5 partners  Other Topics Concern   Not on file  Social History Narrative   Right handed, Married, 6 kids, Caffeine 1 cup daily, Retired,  HS grad,    Social Drivers of Corporate investment banker Strain: Not on file  Food Insecurity: Not on file  Transportation Needs: Not on file  Physical Activity: Not on file  Stress: Not on file  Social Connections: Not on file    Family History  Problem Relation Age of Onset   Diabetes Mother    Breast cancer Sister        Age unknown   Diabetes Sister    Diabetes Brother    Breast cancer Maternal Grandmother        Age unknown    Past Medical History:  Diagnosis Date   Angio-edema    Arthritis    osteo   Asthma    none in years   Bradycardia    Breast cancer (HCC) 2010   Left   Chest pain    Chronic kidney disease    states elevated creatnine- followed by Dr Waynard Edwards   Complication of anesthesia    following tympanoplasty in 1997- "HAD TIGHTENING AROUND CHEST FOLLOWING ANESTHESIA'- states has done well x 2 since   Diabetes mellitus    borderline/diet controlled   GERD (gastroesophageal reflux disease)    Gout    Hypertension    Obesity    Personal history of radiation therapy    Scoliosis    Sleep apnea    states setting on 11- followed by Dr Zipporah Plants yearly- last study 7 years ago   Sleep apnea with use of continuous positive airway pressure (CPAP) 01/24/2013   Urticaria     Past Surgical History:  Procedure Laterality Date   BREAST LUMPECTOMY Left 07/2008   Left - Dr Jamey Ripa   CARPAL TUNNEL RELEASE     right   DILATION AND CURETTAGE OF UTERUS     SHOULDER OPEN ROTATOR CUFF REPAIR  05/27/2011   Procedure: ROTATOR CUFF REPAIR SHOULDER OPEN;  Surgeon: Jacki Cones, MD;  Location: [facility];  Service: Orthopedics;  Laterality: Right;  Right  Shoulder Rotator Cuff Repair complex     TONSILLECTOMY     TYMPANOPLASTY  1997   right     Current Outpatient Medications on File Prior to Visit  Medication Sig Dispense Refill   allopurinol (ZYLOPRIM) 300 MG tablet Take 150 mg by mouth daily.     amLODipine (NORVASC) 5 MG tablet Take 5 mg by mouth daily.      aspirin 81 MG tablet Take 81 mg by mouth every other day.     carvedilol (COREG) 12.5 MG tablet Take 1 tablet (12.5 mg total) by mouth 2 (two) times daily with a meal. 180 tablet 3   chlorthalidone (HYGROTON) 25 MG tablet Take 12.5 mg by mouth daily.     Cholecalciferol (VITAMIN D) 1000 UNITS capsule Take 1,000 Units by mouth daily.     Coenzyme Q10 (CO Q 10 PO) Take 1 tablet by mouth daily.      donepezil (ARICEPT) 5 MG tablet Take 5 mg by mouth at bedtime.     EPIPEN 2-PAK 0.3 MG/0.3ML DEVI 0.3 mg.      HYDROcodone-acetaminophen (NORCO/VICODIN) 5-325 MG tablet Take 1-2 tablets by mouth every 6 (six) hours as needed for moderate pain. 10 tablet 0   levothyroxine (SYNTHROID) 50 MCG tablet Take 25 mcg by mouth daily.     lidocaine (LIDODERM) 5 % Place 1 patch onto the skin daily. Remove & Discard patch within 12 hours or as directed by MD 10 patch 0   olmesartan (BENICAR) 20 MG tablet Take 20 mg by mouth daily.     rosuvastatin (CRESTOR) 10 MG tablet Take 10 mg by mouth 3 (three) times a week.      No current facility-administered medications  on file prior to visit.    Allergies  Allergen Reactions   Shellfish Allergy Swelling   Sulfa Antibiotics Hives   Elemental Sulfur Swelling and Rash   Pantoprazole Sodium Palpitations     DIAGNOSTIC DATA (LABS, IMAGING, TESTING) - I reviewed patient records, labs, notes, testing and imaging myself where available.  Lab Results  Component Value Date   WBC 10.6 (H) 08/26/2016   HGB 12.5 08/26/2016   HCT 38.5 08/26/2016   MCV 84.1 08/26/2016   PLT 218 08/26/2016      Component Value Date/Time   NA 140 08/26/2016 1737   NA 140  07/06/2013 0900   K 4.0 08/26/2016 1737   K 3.9 07/06/2013 0900   CL 106 08/26/2016 1737   CL 106 07/05/2012 1101   CO2 24 08/26/2016 1737   CO2 20 (L) 07/06/2013 0900   GLUCOSE 111 (H) 08/26/2016 1737   GLUCOSE 170 (H) 07/06/2013 0900   GLUCOSE 182 (H) 07/05/2012 1101   BUN 26 (H) 08/26/2016 1737   BUN 21.2 07/06/2013 0900   CREATININE 1.57 (H) 08/26/2016 1737   CREATININE 1.4 (H) 07/06/2013 0900   CALCIUM 8.9 08/26/2016 1737   CALCIUM 9.4 07/06/2013 0900   PROT 7.6 08/26/2016 1737   PROT 7.3 07/06/2013 0900   ALBUMIN 3.9 08/26/2016 1737   ALBUMIN 3.6 07/06/2013 0900   AST 21 08/26/2016 1737   AST 14 07/06/2013 0900   ALT 16 08/26/2016 1737   ALT 13 07/06/2013 0900   ALKPHOS 83 08/26/2016 1737   ALKPHOS 102 07/06/2013 0900   BILITOT 0.4 08/26/2016 1737   BILITOT 0.37 07/06/2013 0900   GFRNONAA 30 (L) 08/26/2016 1737   GFRAA 35 (L) 08/26/2016 1737   No results found for: "CHOL", "HDL", "LDLCALC", "LDLDIRECT", "TRIG", "CHOLHDL" No results found for: "HGBA1C" No results found for: "VITAMINB12" No results found for: "TSH"  PHYSICAL EXAM:  Today's Vitals   05/31/23 1333  Weight: 268 lb (121.6 kg)  Height: 5\' 4"  (1.626 m)   Body mass index is 46 kg/m.   Wt Readings from Last 3 Encounters:  05/31/23 268 lb (121.6 kg)  10/27/22 274 lb 3.2 oz (124.4 kg)  06/30/22 279 lb (126.6 kg)     Ht Readings from Last 3 Encounters:  05/31/23 5\' 4"  (1.626 m)  10/27/22 5\' 6"  (1.676 m)  06/30/22 5\' 4"  (1.626 m)      General: The patient is awake, alert and appears not in acute distress. The patient is well groomed. Head: Normocephalic, atraumatic. Neck is supple. Well developed, in no acute distress  Cardiology: normal rate and rhythm, no murmur noted Neurological examination  Mentation: Alert oriented to time, place, history taking.  Follows all commands speech and language fluent Cranial nerve :  No loss of smell or taste reported.  Pupils were equal round reactive to  light.  Hearing is impaired to soft voice-  Rinne and Weber non lateralizing.  Extraocular movements were full, visual field were full on confrontational test.  Facial sensation and strength were normal.  Uvula tongue midline. Head turning and shoulder shrug  were normal and symmetric. Motor: 3 over 5 strength of the lower 4 extremities. 4 plus out of 5 in upper extremities.  ROM for shoulder range of motion is reduced.   Can lift the arm -but  not above shoulder level,  She can rotate the extended arm . She can not reach behind , cannot tie an apron string. Can't close the bra in her back.  Symmetric motor tone is noted throughout.  Gait and station: Gait is wide based, patient needed a walker.   She uses a seated walker.  She leans onto the walker, stooped.     ASSESSMENT AND PLAN 89 y.o. year old female  patient of this office for 18 years , here with:   MCI versus earlier dementia stages.  She s 100% opposed to consider AD testing and she wouldn't want infusion MAB therapy.   I have therefor not ordered more differentiating tests for her.  She failed the trail making test, the visio-spatial drawing, the clock and the serial 7.     1)  MMSE 27/ 30 and MOCA was today 16/ 30 this would be still MCI by MMSE-  she has not accepted that the memory loss could be a disorder and not just "aging".   2) We need to increase Aricept now from 5 mg  to 10 mg daily, but it turns out the patient has not taken the 5 mg dose.   So there is no way to increase the dose now, we discussed why she needs to START TALKING THE MEDICATION and  explained possible side effects.    MRI was already showing atrophy 20 years ago.  MMSE was MCI level score in November 2024,  MOCA was 16/ 30   3) OSA on CPAP - we couldn't address this today , her memory test  was completed 18 minutes into her appointment time.    I plan to follow up either personally or through our NP within 6 months.   I would like to thank  Rodrigo Ran, MD , [address] [place],  [place] [address] for allowing me to meet with and to take care of this pleasant patient.   CC: I will share my notes with .  After spending a total time of  45  minutes face to face and additional time for physical and neurologic examination, review of laboratory studies,  personal review of imaging studies, reports and results of other testing and review of referral information / records as far as provided in visit,   Electronically signed by: Melvyn Novas, MD 05/31/2023 1:48 PM  [facility] and [facility] Sleep Board certified by The [employer] of Sleep Medicine and Diplomate of the [employer] of Sleep Medicine. Board certified In Neurology through the ABPN, Fellow of the [employer] of Neu[employer].

## 2023-05-31 NOTE — Patient Instructions (Signed)
 Problems With Thinking and Memory (Mild Neurocognitive Disorder): What to Know Mild neurocognitive disorder, formerly known as mild cognitive impairment, is a disorder where your memory doesn't work as well as it should. It may also affect other mental abilities like thinking, communicating, behavior, and being able to finish tasks. These problems can be noticed and measured. But they usually don't stop you from doing daily activities or living on your own. Mild neurocognitive disorder usually happens after 87 years of age. But it can also happen at younger ages. It's not as serious as major neurocognitive disorder, also known as dementia, but it may be the first sign of it. In general, the symptoms of this condition get worse over time. In rare cases, symptoms can get better. What are the causes? This condition may be caused by: Brain disorders like Alzheimer's disease, Parkinson's disease, and other conditions that slowly damage nerve cells. Diseases that affect the blood vessels in the brain and cause small strokes. Certain infections, like HIV. Traumatic brain injury. Other medical conditions, such as brain tumors, underactive thyroid (hypothyroidism), and not having enough vitamin B12. Using certain drugs or medicines. What increases the risk? Being older than 87 years of age. Being female. Having a lower level of education. Diabetes, high blood pressure, high cholesterol, and other conditions that raise the risk for blood vessel diseases. Untreated or undertreated sleep apnea. Having a certain type of gene that can be inherited, or passed down from parent to child. Long-term health problems like heart disease, lung disease, liver disease, kidney disease, or depression. What are the signs or symptoms? Trouble remembering things. You may: Forget names, phone numbers, or details of recent events. Forget about social events and appointments. Often forget where you put your car keys or other  items. Trouble thinking and solving problems. You may have trouble with complex tasks like: Paying bills. Driving in places you don't know well. Trouble communicating. You may have trouble: Finding the right word or naming an object. Forming a sentence that makes sense. Understanding what you read or hear. Changes in your behavior or personality. When this happens, you may: Lose interest in the things you used to enjoy. Avoid being around people. Get angry more easily than usual. Act before thinking. How is this diagnosed? This condition is diagnosed based on: Your symptoms. Your health care provider may ask you and the people you spend time with, like family and friends, about your symptoms. Memory tests and other tests to check how your brain is working. Your provider may refer you to a provider called a neurologist or a mental health specialist. To try to find out the cause of your condition, your provider may: Get a detailed medical history. Ask about use of alcohol, drugs, and medicines. Do a physical exam. Order blood tests and brain imaging tests. How is this treated? Mild neurocognitive disorder that's caused by medicine use, drug use, infection, or another medical condition may get better when the cause is treated, or when medicines or drugs are stopped. If this disorder has another cause, it usually doesn't improve and may get worse. In these cases, the goal of treatment is to help you manage the symptoms. This may include: Medicines to help with memory and behavior symptoms. Talk therapy. This provides education, emotional support, memory aids, and other ways of making up for problems with mental tasks. Lifestyle changes. These may include: Getting regular exercise. Eating a healthy diet that includes omega-3 fatty acids. Doing things to challenge your thinking  and memory skills. Spending more time being with and talking to other people. Using routines like having regular  times for meals and going to bed. Follow these instructions at home: Eating and drinking  Drink more fluids as told. Eat a healthy diet that includes omega-3 fatty acids. These can be found in: Fish. Nuts. Leafy vegetables. Vegetable oils. If you drink alcohol: Limit how much you have to: 0-1 drink a day if you're female. 0-2 drinks a day if you're female. Know how much alcohol is in your drink. In the [place] one drink is one 12 oz bottle of beer (355 mL), one 5 oz glass of wine (148 mL), or one 1 oz glass of hard liquor (44 mL). Lifestyle  Get regular exercise as told by your provider. Do not smoke, vape, or use nicotine or tobacco. Use healthy ways to manage stress. If you need help managing stress, ask your provider. Keep spending time with other people. Keep your mind active by doing activities you enjoy, like reading or playing games. Make sure you get good sleep at night. These tips can help: Try not to take naps during the day. Keep your bedroom dark and cool. Do not exercise in the few hours before you go to bed. Do not have foods or drinks with caffeine at night. General instructions Take medicines only as told. Your provider may tell you to avoid taking medicines that can affect thinking. These include some medicines for pain or sleeping. Work with your provider to find out: What things you need help with. What your safety needs are. Where to find more information [employer] on Aging: [email or web address] Contact a health care provider if: You have any new symptoms. Get help right away if: You have new confusion or your confusion gets worse. You act in ways that put you or your family in danger. This information is not intended to replace advice given to you by your health care provider. Make sure you discuss any questions you have with your health care provider. Document Revised: 09/15/2022 Document Reviewed: 09/15/2022 Elsevier Patient Education  2024 Elsevier  Inc.   Memory Compensation Strategies  Use "WARM" strategy.  W= write it down  A= associate it  R= repeat it  M= make a mental note  2.   You can keep a Glass blower/designer.  Use a 3-ring notebook with sections for the following: calendar, important names and phone numbers,  medications, doctors' names/phone numbers, lists/reminders, and a section to journal what you did  each day.   3.    Use a calendar to write appointments down.  4.    Write yourself a schedule for the day.  This can be placed on the calendar or in a separate section of the Memory Notebook.  Keeping a  regular schedule can help memory.  5.    Use medication organizer with sections for each day or morning/evening pills.  You may need help loading it  6.    Keep a basket, or pegboard by the door.  Place items that you need to take out with you in the basket or on the pegboard.  You may also want to  include a message board for reminders.  7.    Use sticky notes.  Place sticky notes with reminders in a place where the task is performed.  For example: " turn off the  stove" placed by the stove, "lock the door" placed on the door at eye level, " take your medications"  on  the bathroom mirror or by the place where you normally take your medications.  8.    Use alarms/timers.  Use while cooking to remind yourself to check on food or as a reminder to take your medicine, or as a  reminder to make a call, or as a reminder to perform another task, etc.    Donepezil Tablets What is this medication? DONEPEZIL (doe NEP e zil) treats memory loss and confusion (dementia) in people who have Alzheimer disease. It works by improving attention, memory, and the ability to engage in daily activities. It is not a cure for dementia or Alzheimer disease. This medicine may be used for other purposes; ask your health care provider or pharmacist if you have questions. COMMON BRAND NAME(S): Aricept What should I tell my care team before I  take this medication? They need to know if you have any of these conditions: Head injury Heart disease Irregular heartbeat or rhythm Liver disease Lung or breathing disease, such as asthma Seizures Stomach ulcers, other stomach or intestine problems Stomach bleeding Trouble passing urine An unusual or allergic reaction to donepezil, other medications, foods, dyes, or preservatives Pregnant or trying to get pregnant Breastfeeding How should I use this medication? Take this medication by mouth with a glass of water. Follow the directions on the prescription label. You may take this medication with or without food. Take this medication at regular intervals. This medication is usually taken before bedtime. Do not take it more often than directed. Continue to take your medication even if you feel better. Do not stop taking except on your care team's advice. If you are taking the 23 mg donepezil tablet, swallow it whole; do not cut, crush, or chew it. Talk to your care team about the use of this medication in children. Special care may be needed. Overdosage: If you think you have taken too much of this medicine contact a poison control center or emergency room at once. NOTE: This medicine is only for you. Do not share this medicine with others. What if I miss a dose? If you miss a dose, take it as soon as you can. If it is almost time for your next dose, take only that dose, do not take double or extra doses. What may interact with this medication? Do not take this medication with any of the following: Certain medications for fungal infections, such as itraconazole, fluconazole, posaconazole, voriconazole Cisapride Dextromethorphan; quinidine Dronedarone Pimozide Quinidine Thioridazine This medication may also interact with the following: Antihistamines for allergy, cough, and cold Atropine Bethanechol Carbamazepine Certain medications for bladder problems, such as oxybutynin or  tolterodine Certain medications for Parkinson disease, such as benztropine or trihexyphenidyl Certain medications for stomach problems, such as dicyclomine or hyoscyamine Certain medications for travel sickness, such as scopolamine Dexamethasone Dofetilide Ipratropium NSAIDs, medications for pain and inflammation, such as ibuprofen or naproxen Other medications for Alzheimer disease Other medications that cause heart rhythm changes Phenobarbital Phenytoin Rifampin, rifabutin, or rifapentine Ziprasidone This list may not describe all possible interactions. Give your health care provider a list of all the medicines, herbs, non-prescription drugs, or dietary supplements you use. Also tell them if you smoke, drink alcohol, or use illegal drugs. Some items may interact with your medicine. What should I watch for while using this medication? Visit your care team for regular checks on your progress. Tell your care team if your symptoms do not start to get better or if they get worse. This medication may affect  your coordination, reaction time, or judgment. Do not drive or operate machinery until you know how this medication affects you. Sit up or stand slowly to reduce the risk of dizzy or fainting spells. Drinking alcohol with this medication can increase the risk of these side effects. What side effects may I notice from receiving this medication? Side effects that you should report to your care team as soon as possible: Allergic reactions--skin rash, itching, hives, swelling of the face, lips, tongue, or throat Peptic ulcer--burning stomach pain, loss of appetite, bloating, burping, heartburn, nausea, vomiting Seizures Slow heartbeat--dizziness, feeling faint or lightheaded, confusion, trouble breathing, unusual weakness or fatigue Stomach bleeding--bloody or black, tar-like stools, vomiting blood or brown material that looks like coffee grounds Trouble passing urine Side effects that usually  do not require medical attention (report these to your care team if they continue or are bothersome): Diarrhea Fatigue Loss of appetite Muscle pain or cramps Nausea Trouble sleeping This list may not describe all possible side effects. Call your doctor for medical advice about side effects. You may report side effects to FDA at 1-800-FDA-1088. Where should I keep my medication? Keep out of reach of children. Store at room temperature between 15 and 30 degrees C (59 and 86 degrees F). Throw away any unused medication after the expiration date. NOTE: This sheet is a summary. It may not cover all possible information. If you have questions about this medicine, talk to your doctor, pharmacist, or health care provider.  2024 Elsevier/Gold Standard (2022-10-01 00:00:00)   Neurocognitive impairment - unspecified origin, non vascular.   She is not interested in further dx of AD , yes or no and it would not have therapeutic consequences.  Please return in 6 months for NP visit to look at OSA and CPAP machine.   She will undergo a MMSE at the same visit.    She is supposed to increase the Aricept dose from 5 mg to 10 mg  daily.    Melvyn Novas, MD

## 2023-06-11 ENCOUNTER — Other Ambulatory Visit: Payer: Self-pay | Admitting: Cardiovascular Disease

## 2023-06-11 DIAGNOSIS — I10 Essential (primary) hypertension: Secondary | ICD-10-CM

## 2023-06-11 DIAGNOSIS — E66813 Obesity, class 3: Secondary | ICD-10-CM

## 2023-06-20 ENCOUNTER — Encounter: Payer: Self-pay | Admitting: Cardiovascular Disease

## 2023-06-20 NOTE — Progress Notes (Signed)
 No show  This encounter was created in error - please disregard.

## 2023-06-21 ENCOUNTER — Ambulatory Visit: Attending: Cardiovascular Disease | Admitting: Cardiovascular Disease

## 2023-06-22 ENCOUNTER — Encounter: Payer: Self-pay | Admitting: Cardiovascular Disease

## 2023-06-30 ENCOUNTER — Ambulatory Visit: Payer: Medicare Other | Admitting: Neurology

## 2023-07-02 NOTE — Progress Notes (Unsigned)
 No show

## 2023-08-19 ENCOUNTER — Other Ambulatory Visit: Payer: Self-pay | Admitting: Cardiovascular Disease

## 2023-08-19 DIAGNOSIS — Z6841 Body Mass Index (BMI) 40.0 and over, adult: Secondary | ICD-10-CM

## 2023-08-19 DIAGNOSIS — I10 Essential (primary) hypertension: Secondary | ICD-10-CM

## 2023-09-30 ENCOUNTER — Telehealth: Payer: Self-pay | Admitting: Neurology

## 2023-09-30 NOTE — Telephone Encounter (Signed)
 [facility]) Calling on status request for patient's CPAP supplies. Faxed over on 09/22/23. Would like a call back.

## 2023-09-30 NOTE — Telephone Encounter (Signed)
 Spoke w/rep from [facility] regarding requested OV notes and provider signature. Informed rep Pt last OV addressing OSA was 06/30/22 and provider currently out of the office until 10/11/23. Rep voiced understanding and they will reach out after provider returns.

## 2023-10-19 ENCOUNTER — Other Ambulatory Visit: Payer: Self-pay | Admitting: Cardiovascular Disease

## 2023-10-19 DIAGNOSIS — Z6841 Body Mass Index (BMI) 40.0 and over, adult: Secondary | ICD-10-CM

## 2023-10-19 DIAGNOSIS — I10 Essential (primary) hypertension: Secondary | ICD-10-CM

## 2023-10-22 NOTE — Progress Notes (Deleted)
 SABRA

## 2023-10-25 ENCOUNTER — Ambulatory Visit: Payer: 59 | Admitting: Neurology

## 2023-11-01 ENCOUNTER — Encounter: Payer: Self-pay | Admitting: Cardiovascular Disease

## 2023-11-01 ENCOUNTER — Ambulatory Visit: Attending: Cardiovascular Disease | Admitting: Cardiovascular Disease

## 2023-11-01 VITALS — BP 118/76 | HR 66 | Ht 65.0 in | Wt 250.2 lb

## 2023-11-01 DIAGNOSIS — I119 Hypertensive heart disease without heart failure: Secondary | ICD-10-CM | POA: Diagnosis not present

## 2023-11-01 DIAGNOSIS — I5032 Chronic diastolic (congestive) heart failure: Secondary | ICD-10-CM

## 2023-11-01 DIAGNOSIS — G4733 Obstructive sleep apnea (adult) (pediatric): Secondary | ICD-10-CM | POA: Diagnosis not present

## 2023-11-01 DIAGNOSIS — I10 Essential (primary) hypertension: Secondary | ICD-10-CM

## 2023-11-01 NOTE — Assessment & Plan Note (Signed)
 On CPAP. ?

## 2023-11-01 NOTE — Assessment & Plan Note (Signed)
 History of essential hypertension with blood pressure measured today at 118/76.  She is on carvedilol , olmesartan  and chlorthalidone.

## 2023-11-01 NOTE — Patient Instructions (Signed)

## 2023-11-01 NOTE — Progress Notes (Signed)
 11/01/2023 Vicki [name]   1935/01/27  [id number]  Primary Physician Shayne Anes, MD Primary Cardiologist: Dorn JINNY Lesches MD GENI CODY MADEIRA, FSCAI  HPI:  Vicki [name] is a 89 y.o. moderately overweight married African-American female mother of 6 children, grandmother of 15 grandchildren who is formally a patient of Dr. Linton.  I am assuming her care in his absence.  She is accompanied by one of her daughtersShawneeque.  She is retired from working at [employer].  She does have a history of treated hypertension and hyperlipidemia.  There is no family history for heart disease.  She is never had a heart attack or stroke.  She does complain of some dyspnea on exertion which has been a chronic problem.  She also has obstructive sleep apnea on CPAP.  She had a 2D echocardiogram performed 11/19/2022 which was essentially normal with normal diastolic parameters and RV systolic pressure of 30 mmHg.   Current Meds  Medication Sig   albuterol (VENTOLIN HFA) 108 (90 Base) MCG/ACT inhaler Inhale 2 puffs into the lungs every 4 (four) hours as needed (as needed).   allopurinol  (ZYLOPRIM ) 300 MG tablet Take 150 mg by mouth daily.   amLODipine  (NORVASC ) 5 MG tablet Take 5 mg by mouth daily.    aspirin  81 MG tablet Take 81 mg by mouth every other day.   carvedilol  (COREG ) 12.5 MG tablet Take 1 tablet (12.5 mg total) by mouth 2 (two) times daily with a meal.   chlorthalidone (HYGROTON) 25 MG tablet Take 12.5 mg by mouth daily.   Cholecalciferol (VITAMIN D ) 1000 UNITS capsule Take 1,000 Units by mouth daily.   Coenzyme Q10 (CO Q 10 PO) Take 1 tablet by mouth daily.    donepezil (ARICEPT) 5 MG tablet Take 5 mg by mouth at bedtime.   EPIPEN 2-PAK 0.3 MG/0.3ML DEVI 0.3 mg.    glucose blood (ONETOUCH ULTRA) test strip 1 each by Other route as needed.   HYDROcodone -acetaminophen  (NORCO/VICODIN) 5-325 MG tablet Take 1-2 tablets by mouth every 6 (six) hours as needed for moderate pain.    levothyroxine (SYNTHROID) 50 MCG tablet Take 25 mcg by mouth daily.   lidocaine  (LIDODERM ) 5 % Place 1 patch onto the skin daily. Remove & Discard patch within 12 hours or as directed by MD   olmesartan  (BENICAR ) 20 MG tablet Take 20 mg by mouth daily.   rosuvastatin  (CRESTOR ) 10 MG tablet Take 10 mg by mouth 3 (three) times a week.      Allergies  Allergen Reactions   Shellfish Allergy Swelling   Sulfa Antibiotics Hives   Elemental Sulfur Swelling and Rash   Pantoprazole Sodium Palpitations    Social History   Socioeconomic History   Marital status: Married    Spouse name: Not on file   Number of children: Not on file   Years of education: Not on file   Highest education level: Not on file  Occupational History   Not on file  Tobacco Use   Smoking status: Former    Current packs/day: 0.00    Types: Cigarettes    Quit date: 04/06/1961    Years since quitting: 62.6   Smokeless tobacco: Never  Vaping Use   Vaping status: Never Used  Substance and Sexual Activity   Alcohol use: No   Drug use: No   Sexual activity: Not Currently    Birth control/protection: Post-menopausal    Comment: 1st intercourse 22 yo-5 partners  Other Topics Concern  Not on file  Social History Narrative   Right handed, Married, 6 kids, Caffeine 1 cup daily, Retired,  [employer] grad,    Social Drivers of Corporate investment banker Strain: Not on [employer] Insecurity: Not on file  Transportation Needs: Not on file  Physical Activity: Not on file  Stress: Not on file  Social Connections: Not on file  Intimate Partner Violence: Not on file     Review of Systems: General: negative for chills, fever, night sweats or weight changes.  Cardiovascular: negative for chest pain, dyspnea on exertion, edema, orthopnea, palpitations, paroxysmal nocturnal dyspnea or shortness of breath Dermatological: negative for rash Respiratory: negative for cough or wheezing Urologic: negative for hematuria Abdominal:  negative for nausea, vomiting, diarrhea, bright red blood per rectum, melena, or hematemesis Neurologic: negative for visual changes, syncope, or dizziness All other systems reviewed and are otherwise negative except as noted above.    Blood pressure 118/76, pulse 66, height 5' 5 (1.651 m), weight 250 lb 3.2 oz (113.5 kg), SpO2 96%.  General appearance: alert and no distress Neck: no adenopathy, no carotid bruit, no JVD, supple, symmetrical, trachea midline, and thyroid not enlarged, symmetric, no tenderness/mass/nodules Lungs: clear to auscultation bilaterally Heart: regular rate and rhythm, S1, S2 normal, no murmur, click, rub or gallop Extremities: extremities normal, atraumatic, no cyanosis or edema Pulses: 2+ and symmetric Skin: Skin color, texture, turgor normal. No rashes or lesions Neurologic: Grossly normal  EKG EKG Interpretation Date/Time:  Monday November 01 2023 14:46:40 EDT Ventricular Rate:  66 PR Interval:  116 QRS Duration:  82 QT Interval:  408 QTC Calculation: 427 R Axis:   -22  Text Interpretation: Normal sinus rhythm Nonspecific T wave abnormality When compared with ECG of 27-Oct-2022 15:15, Criteria for Inferior infarct are no longer Present T wave inversion no longer evident in Inferior leads Nonspecific T wave abnormality, worse in Lateral leads Confirmed by Court Carrier [phone]) on 11/01/2023 2:54:56 PM    ASSESSMENT AND PLAN:   Hypertension History of essential hypertension with blood pressure measured today at 118/76.  She is on carvedilol , olmesartan  and chlorthalidone.  OSA on CPAP On CPAP  Chronic diastolic CHF (congestive heart failure) (HCC) On oral diuretics although her 2D echo performed 11/19/2022 showed normal diastolic parameters without valvular abnormalities.     Carrier DOROTHA Court MD FACP,FACC,FAHA, [facility] 11/01/2023 3:05 PM

## 2023-11-01 NOTE — Assessment & Plan Note (Signed)
 On oral diuretics although her 2D echo performed 11/19/2022 showed normal diastolic parameters without valvular abnormalities.

## 2023-11-08 ENCOUNTER — Ambulatory Visit (INDEPENDENT_AMBULATORY_CARE_PROVIDER_SITE_OTHER): Admitting: Neurology

## 2023-11-08 ENCOUNTER — Encounter: Payer: Self-pay | Admitting: Neurology

## 2023-11-08 VITALS — BP 153/82 | HR 66 | Ht 65.0 in | Wt 252.0 lb

## 2023-11-08 DIAGNOSIS — R413 Other amnesia: Secondary | ICD-10-CM

## 2023-11-08 DIAGNOSIS — F518 Other sleep disorders not due to a substance or known physiological condition: Secondary | ICD-10-CM | POA: Insufficient documentation

## 2023-11-08 DIAGNOSIS — G4730 Sleep apnea, unspecified: Secondary | ICD-10-CM | POA: Diagnosis not present

## 2023-11-08 DIAGNOSIS — G4733 Obstructive sleep apnea (adult) (pediatric): Secondary | ICD-10-CM

## 2023-11-08 DIAGNOSIS — I5032 Chronic diastolic (congestive) heart failure: Secondary | ICD-10-CM

## 2023-11-08 NOTE — Patient Instructions (Signed)
 1) OSA on CPAP , just having overcome  pneumonia.  Still hoarse and sob.  This CPAP machine was issued in 2024   Compliance is high for the last 30 days and for 7 hours and 44 minutes on average each night.  She has received the machine with a setting of pressure between 5 and 13 cm water with 2 cm EPR , her residual AHI is 1.0/h , which is excellent.     She has no central apneas arising.   The 95th percentile pressure is 10.6 cm water.  She does have moderate air leakage.   I will encourage her to continue using the CPAP.   The MMSE gave no evidence of cognitive impairment.   Aricept was not tolerated at night time, 5 mg.   I have told her to take it in AM and she is not wanting to restart.      RV in 12 months - with an alternative MOCA or MMSE test ( changing the animals, the recall  words, starting serial 7th at 80 -7, )     I would like to thank Shayne Anes, Md [address] [place],  [place] [address] for allowing me to meet with this pleasant patient.

## 2023-11-08 NOTE — Progress Notes (Signed)
 Provider:  Dedra Gores, MD  Primary Care Physician:  Shayne Anes, MD [address] [place] [address]     Referring Provider: Shayne Anes, Md [address] [place],  [place] [address]          Chief Complaint according to patient   Patient presents with:                HISTORY OF PRESENT ILLNESS:  Vicki [name] is a 89 y.o. female patient who is here for revisit 11/08/2023 for OSA on CPAP:    Memory disorder and  cerebral small vessel disease,  She has a  remote history of breast cancer,  sees Dr Court for HTN since Dr Alveta retired.  Last month she had pneumonia, treated with ATB, SOB, walks with walker. 9 osteoarthritis)   Last Thanksgiving she had bronchitis.       She feels tired. Lost 18 pounds since her pneumonia, part in fluid.   Chief concern according to patient :   I am not concerned about my memory  .     11/08/2023    3:56 PM  MMSE - Mini Mental State Exam  Orientation to time 4  Orientation to Place 5  Registration 3  Attention/ Calculation 5  Attention/Calculation-comments unable to do numbers  Recall 3  Language- name 2 objects 2  Language- repeat 1  Language- follow 3 step command 3  Language- read & follow direction 1  Write a sentence 1  Copy design 1  Total score 29       Review of Systems: Out of a complete 14 system review, the patient complains of only the following symptoms, and all other reviewed systems are negative.:   SLEEPINESS ?  How likely are you to doze in the following situations: 0 = not likely, 1 = slight chance, 2 = moderate chance, 3 = high chance  Sitting and Reading? Watching Television? Sitting inactive in a public place (theater or meeting)? Lying down in the afternoon when circumstances permit? Sitting and talking to someone? Sitting quietly after lunch without alcohol? In a car, while stopped for a few minutes in traffic? As a passenger in a car for an hour without a  break?  Total = 6/ 24  FSS: 3/ 15.        Social History   Socioeconomic History   Marital status: Married    Spouse name: Not on file   Number of children: Not on file   Years of education: Not on file   Highest education level: Not on file  Occupational History   Not on file  Tobacco Use   Smoking status: Former    Current packs/day: 0.00    Types: Cigarettes    Quit date: 04/06/1961    Years since quitting: 62.6   Smokeless tobacco: Never  Vaping Use   Vaping status: Never Used  Substance and Sexual Activity   Alcohol use: No   Drug use: No   Sexual activity: Not Currently    Birth control/protection: Post-menopausal    Comment: 1st intercourse 48 yo-5 partners  Other Topics Concern   Not on file  Social History Narrative   Right handed, Married, 6 kids, Caffeine 1 cup daily, Retired,  [employer] grad,    Social Drivers of Corporate investment banker Strain: Not on file  Food Insecurity: Not on file  Transportation Needs: Not on file  Physical Activity: Not on  file  Stress: Not on file  Social Connections: Not on file    Family History  Problem Relation Age of Onset   Diabetes Mother    Breast cancer Sister        Age unknown   Diabetes Sister    Diabetes Brother    Breast cancer Maternal Grandmother        Age unknown    Past Medical History:  Diagnosis Date   Angio-edema    Arthritis    osteo   Asthma    none in years   Bradycardia    Breast cancer (HCC) 2010   Left   Chest pain    Chronic kidney disease    states elevated creatnine- followed by Dr Shayne   Complication of anesthesia    following tympanoplasty in 1997- HAD TIGHTENING AROUND CHEST FOLLOWING ANESTHESIA'- states has done well x 2 since   Diabetes mellitus    borderline/diet controlled   GERD (gastroesophageal reflux disease)    Gout    Hypertension    Obesity    Personal history of radiation therapy    Scoliosis    Sleep apnea    states setting on 11- followed by Dr  Willeen yearly- last study 7 years ago   Sleep apnea with use of continuous positive airway pressure (CPAP) 01/24/2013   Urticaria     Past Surgical History:  Procedure Laterality Date   BREAST LUMPECTOMY Left 07/2008   Left - Dr Merrilyn   CARPAL TUNNEL RELEASE     right   DILATION AND CURETTAGE OF UTERUS     SHOULDER OPEN ROTATOR CUFF REPAIR  05/27/2011   Procedure: ROTATOR CUFF REPAIR SHOULDER OPEN;  Surgeon: Tanda DELENA Heading, MD;  Location: [facility];  Service: Orthopedics;  Laterality: Right;  Right Shoulder Rotator Cuff Repair complex     TONSILLECTOMY     TYMPANOPLASTY  1997   right     Current Outpatient Medications on File Prior to Visit  Medication Sig Dispense Refill   albuterol (VENTOLIN HFA) 108 (90 Base) MCG/ACT inhaler Inhale 2 puffs into the lungs every 4 (four) hours as needed (as needed).     allopurinol  (ZYLOPRIM ) 300 MG tablet Take 150 mg by mouth daily.     amLODipine  (NORVASC ) 5 MG tablet Take 5 mg by mouth daily.      aspirin  81 MG tablet Take 81 mg by mouth every other day.     carvedilol  (COREG ) 12.5 MG tablet Take 1 tablet (12.5 mg total) by mouth 2 (two) times daily with a meal. 60 tablet 0   chlorthalidone (HYGROTON) 25 MG tablet Take 12.5 mg by mouth daily.     Cholecalciferol (VITAMIN D ) 1000 UNITS capsule Take 1,000 Units by mouth daily.     Coenzyme Q10 (CO Q 10 PO) Take 1 tablet by mouth daily.      donepezil (ARICEPT) 5 MG tablet Take 5 mg by mouth at bedtime.     EPIPEN 2-PAK 0.3 MG/0.3ML DEVI 0.3 mg.      glucose blood (ONETOUCH ULTRA) test strip 1 each by Other route as needed.     HYDROcodone -acetaminophen  (NORCO/VICODIN) 5-325 MG tablet Take 1-2 tablets by mouth every 6 (six) hours as needed for moderate pain. 10 tablet 0   levothyroxine (SYNTHROID) 50 MCG tablet Take 25 mcg by mouth daily.     lidocaine  (LIDODERM ) 5 % Place 1 patch onto the skin daily. Remove & Discard patch within 12 hours or as directed by  MD 10 patch 0   olmesartan  (BENICAR )  20 MG tablet Take 20 mg by mouth daily.     rosuvastatin  (CRESTOR ) 10 MG tablet Take 10 mg by mouth 3 (three) times a week.      No current facility-administered medications on file prior to visit.    Allergies  Allergen Reactions   Shellfish Allergy Swelling   Sulfa Antibiotics Hives   Elemental Sulfur Swelling and Rash   Pantoprazole Sodium Palpitations     DIAGNOSTIC DATA (LABS, IMAGING, TESTING) - I reviewed patient records, labs, notes, testing and imaging myself where available.  Lab Results  Component Value Date   WBC 10.6 (H) 08/26/2016   HGB 12.5 08/26/2016   HCT 38.5 08/26/2016   MCV 84.1 08/26/2016   PLT 218 08/26/2016      Component Value Date/Time   NA 140 08/26/2016 1737   NA 140 07/06/2013 0900   K 4.0 08/26/2016 1737   K 3.9 07/06/2013 0900   CL 106 08/26/2016 1737   CL 106 07/05/2012 1101   CO2 24 08/26/2016 1737   CO2 20 (L) 07/06/2013 0900   GLUCOSE 111 (H) 08/26/2016 1737   GLUCOSE 170 (H) 07/06/2013 0900   GLUCOSE 182 (H) 07/05/2012 1101   BUN 26 (H) 08/26/2016 1737   BUN 21.2 07/06/2013 0900   CREATININE 1.57 (H) 08/26/2016 1737   CREATININE 1.4 (H) 07/06/2013 0900   CALCIUM  8.9 08/26/2016 1737   CALCIUM  9.4 07/06/2013 0900   PROT 7.6 08/26/2016 1737   PROT 7.3 07/06/2013 0900   ALBUMIN 3.9 08/26/2016 1737   ALBUMIN 3.6 07/06/2013 0900   AST 21 08/26/2016 1737   AST 14 07/06/2013 0900   ALT 16 08/26/2016 1737   ALT 13 07/06/2013 0900   ALKPHOS 83 08/26/2016 1737   ALKPHOS 102 07/06/2013 0900   BILITOT 0.4 08/26/2016 1737   BILITOT 0.37 07/06/2013 0900   GFRNONAA 30 (L) 08/26/2016 1737   GFRAA 35 (L) 08/26/2016 1737   No results found for: CHOL, HDL, LDLCALC, LDLDIRECT, TRIG, CHOLHDL No results found for: YHAJ8R No results found for: VITAMINB12 No results found for: TSH  PHYSICAL EXAM:  There were no vitals filed for this visit. No data found. Body mass index is 41.93 kg/m.   Wt Readings from Last 3  Encounters:  11/08/23 252 lb (114.3 kg)  11/01/23 250 lb 3.2 oz (113.5 kg)  05/31/23 268 lb (121.6 kg)     Ht Readings from Last 3 Encounters:  11/08/23 5' 5 (1.651 m)  11/01/23 5' 5 (1.651 m)  05/31/23 5' 4 (1.626 m)     Neck 18   , Mallampotti 3 plus.  Dysphonia.   Ankle edema is mild today.   General: The patient is awake, alert and appears well groomed. She is cooperative.  Head: Normocephalic, atraumatic.  Neck is supple.  Cardiology: normal rate and rhythm, no murmur noted.  Neurological examination  Mentation: Alert oriented to time, place, history taking.  Follows all commands speech and language fluent Cranial nerve :  No loss of smell or taste reported.  Pupils were equal round reactive to light.  Hearing is intact, Rinne and Weber non lateralizing.  Extraocular movements were full, visual field were full on confrontational test.  Facial sensation and strength were normal.   tongue midline.  No tremor.  ead turning and shoulder shrug  were restricted.   Motor: 3 out of  5 strength of the lower extremities. 4 plus out of 5 in upper extremities.  ROM for shoulder range of motion is reduced.   Can lift the arm -but  not above shoulder level,  She can rotate the extended arm . She cannot reach behind her torso- , cannot tie an apron string. Can't close the bra in her back.  Symmetric motor tone is noted throughout.   Gait and station: patient needed her walker.  She leans onto the walker, stooped. Seated walker . She walks slower, turns very slow.  Coordination: Rapid alternating movements in the fingers/hands were of normal speed.  The Finger-to-nose maneuver was intact without evidence of dysmetria or tremor.  Sensory intact to vibration.       ASSESSMENT AND PLAN :   30 - year old female patient , here with:    1) OSA on CPAP , just having overcome  pneumonia.  Still hoarse and sob.  This CPAP machine was issued in 2024  Compliance is high for the  last 30 days and for 7 hours and 44 minutes on average each night.  She has received the machine with a setting of pressure between 5 and 13 cm water with 2 cm EPR , her residual AHI is 1.0/h , which is excellent.    She has no central apneas arising.   The 95th percentile pressure is 10.6 cm water.  She does have moderate air leakage.   I will encourage her to continue using the CPAP.  The MMSE gave no evidence of cognitive impairment.   Aricept was not tolerated at night time, 5 mg.   I have told her to take it in AM and she is not wanting to restart.    RV in 12 months - with an alternative MOCA or MMSE test ( changing the animals, the recall  words, starting serial 7th at 80 -7, )   I would like to thank Shayne Anes, Md [address] [place],  [place] [address] for allowing me to meet with this pleasant patient.   Electronically signed by: Dedra Gores, MD 11/08/2023 3:59 PM  [facility] Board certified by The [employer] of Sleep Medicine and Diplomate of the [employer] of Sleep Medicine. Board certified In Neurology through the ABPN, Fellow of the [employer] of Neurology.  Sleep Clinic Patients are generally offered input on sleep hygiene, life style changes and how to improve compliance with medical treatment where applicable. Review and reiteration of good sleep hygiene measures is offered to any sleep clinic patient, be it in the first consultation or with any follow up visits.  Any CPAP patient should be reminded to be fully compliant with PAP therapy , (defined as using PAP therapy for more than 4 hours each night ) with the goal to improve sleep related symptoms and decrease long term cardiovascular risks. Any PAP therapy patient should be reminded, that it may take up to 3 months to get fully used to using PAP and it may take 1-2 weeks for an established CPAP user to acclimatize to changes in pressure or mask. The earlier  full compliance is achieved, the better long term compliance tends to be.  Any patient with sleepiness should be cautioned not to drive, work at heights, or operate dangerous or heavy equipment when feeling tired or sleepy.    The patient will be seen in follow-up in the sleep clinic at [facility] for discussion of test results, sleep related symptoms and treatment compliance review, further management strategies, etc.   The referring provider will  be notified of the test results.   The patient's condition requires frequent monitoring and adjustments in the treatment plan, reflecting the ongoing complexity of care.  This provider is the continuing focal point for all needed services for this condition.  After spending a total time of  35  minutes face to face and time for  history taking, physical and neurologic examination, review of laboratory studies,  personal review of imaging studies, reports and results of other testing and review of referral information / records as far as provided in visit,

## 2023-11-09 ENCOUNTER — Other Ambulatory Visit: Payer: Self-pay | Admitting: Neurology

## 2023-11-09 DIAGNOSIS — R413 Other amnesia: Secondary | ICD-10-CM

## 2023-11-09 DIAGNOSIS — G4730 Sleep apnea, unspecified: Secondary | ICD-10-CM

## 2023-11-09 DIAGNOSIS — G4733 Obstructive sleep apnea (adult) (pediatric): Secondary | ICD-10-CM

## 2023-11-17 ENCOUNTER — Other Ambulatory Visit: Payer: Self-pay

## 2023-11-17 DIAGNOSIS — I10 Essential (primary) hypertension: Secondary | ICD-10-CM

## 2023-11-17 DIAGNOSIS — Z6841 Body Mass Index (BMI) 40.0 and over, adult: Secondary | ICD-10-CM

## 2023-11-17 MED ORDER — CARVEDILOL 12.5 MG PO TABS: 12.5000 mg | ORAL_TABLET | Freq: Two times a day (BID) | ORAL | 3 refills | Status: AC

## 2023-12-13 ENCOUNTER — Other Ambulatory Visit: Payer: Self-pay | Admitting: Nurse Practitioner

## 2023-12-13 DIAGNOSIS — J189 Pneumonia, unspecified organism: Secondary | ICD-10-CM

## 2023-12-17 ENCOUNTER — Ambulatory Visit
Admission: RE | Admit: 2023-12-17 | Discharge: 2023-12-17 | Disposition: A | Discharge status: Home / Self Care | Source: Ambulatory Visit | Attending: Nurse Practitioner | Admitting: Nurse Practitioner

## 2023-12-17 DIAGNOSIS — J189 Pneumonia, unspecified organism: Secondary | ICD-10-CM

## 2024-01-05 ENCOUNTER — Ambulatory Visit (INDEPENDENT_AMBULATORY_CARE_PROVIDER_SITE_OTHER)

## 2024-01-05 VITALS — BP 106/60 | HR 82 | Temp 97.6°F | Ht 65.0 in | Wt 235.2 lb

## 2024-01-05 DIAGNOSIS — R918 Other nonspecific abnormal finding of lung field: Secondary | ICD-10-CM

## 2024-01-05 DIAGNOSIS — J90 Pleural effusion, not elsewhere classified: Secondary | ICD-10-CM

## 2024-01-05 NOTE — Patient Instructions (Signed)
 It was nice meeting you today  We will schedule you for a thoracentesis with interventional radiology   We will get a repeat CT chest in 3 months

## 2024-01-05 NOTE — Progress Notes (Signed)
 Subjective:   PATIENT ID: Vicki [name] GENDER: female DOB: Sep 28, 1934, MRN: [id number]   HPI 89 year old female with a past medical history of obstructive sleep apnea on CPAP, memory disorder and cerebral small vessel disease, hyperlipidemia, hypertension, obesity, chronic diastolic heart failure, who is presenting to the pulmonary clinic for referral for shortness of breath.    Patient states that she had infectious symptoms including shortness of breath and cough and was diagnosed with pneumonia in July.  At that time she was given a course of azithromycin but continued to have ongoing symptoms and then she was given a second course of antibiotic with amoxicillin clavulanic acid and doxycycline.  She was noted to have a pleural effusion.  Subsequently she was sent for a CT chest in September and was noted to have a right sided pleural effusion that is partially loculated with multiple pulmonary nodules.  At this time she denies any infectious symptoms including fevers or chills or night sweats.  She reports having ongoing shortness of breath on exertion with coughing that is not productive.  She also reports poor appetite and has not been eating well.  She denies any worsening lower extremity edema.  Past Medical History:  Diagnosis Date   Angio-edema    Arthritis    osteo   Asthma    none in years   Bradycardia    Breast cancer (HCC) 2010   Left   Chest pain    Chronic kidney disease    states elevated creatnine- followed by Dr Shayne   Complication of anesthesia    following tympanoplasty in 1997- HAD TIGHTENING AROUND CHEST FOLLOWING ANESTHESIA'- states has done well x 2 since   Diabetes mellitus    borderline/diet controlled   GERD (gastroesophageal reflux disease)    Gout    Hypertension    Obesity    Personal history of radiation therapy    Scoliosis    Sleep apnea    states setting on 11- followed by Dr Willeen yearly- last study 7 years ago   Sleep apnea with  use of continuous positive airway pressure (CPAP) 01/24/2013   Urticaria      Family History  Problem Relation Age of Onset   Diabetes Mother    Breast cancer Sister        Age unknown   Diabetes Sister    Diabetes Brother    Breast cancer Maternal Grandmother        Age unknown     Social History   Socioeconomic History   Marital status: Married    Spouse name: Not on file   Number of children: Not on file   Years of education: Not on file   Highest education level: Not on file  Occupational History   Not on file  Tobacco Use   Smoking status: Former    Current packs/day: 0.00    Types: Cigarettes    Quit date: 04/06/1961    Years since quitting: 62.7   Smokeless tobacco: Never  Vaping Use   Vaping status: Never Used  Substance and Sexual Activity   Alcohol use: No   Drug use: No   Sexual activity: Not Currently    Birth control/protection: Post-menopausal    Comment: 1st intercourse 89 yo-5 partners  Other Topics Concern   Not on file  Social History Narrative   Right handed, Married, 6 kids, Caffeine 1 cup daily, Retired,  HS grad,    Social Drivers of Dispensing optician  Resource Strain: Not on file  Food Insecurity: Not on file  Transportation Needs: Not on file  Physical Activity: Not on file  Stress: Not on file  Social Connections: Not on file  Intimate Partner Violence: Not on file     Allergies  Allergen Reactions   Shellfish Allergy Swelling   Sulfa Antibiotics Hives   Elemental Sulfur Swelling and Rash   Pantoprazole Sodium Palpitations     Outpatient Medications Prior to Visit  Medication Sig Dispense Refill   albuterol (VENTOLIN HFA) 108 (90 Base) MCG/ACT inhaler Inhale 2 puffs into the lungs every 4 (four) hours as needed (as needed).     allopurinol  (ZYLOPRIM ) 300 MG tablet Take 150 mg by mouth daily. (Patient taking differently: Take 150 mg by mouth as needed.)     amLODipine  (NORVASC ) 5 MG tablet Take 5 mg by mouth daily.       aspirin  81 MG tablet Take 81 mg by mouth every other day.     carvedilol  (COREG ) 12.5 MG tablet Take 1 tablet (12.5 mg total) by mouth 2 (two) times daily with a meal. 180 tablet 3   chlorthalidone (HYGROTON) 25 MG tablet Take 12.5 mg by mouth daily.     Cholecalciferol (VITAMIN D ) 1000 UNITS capsule Take 1,000 Units by mouth daily.     EPIPEN 2-PAK 0.3 MG/0.3ML DEVI 0.3 mg.      HYDROcodone -acetaminophen  (NORCO/VICODIN) 5-325 MG tablet Take 1-2 tablets by mouth every 6 (six) hours as needed for moderate pain. 10 tablet 0   levothyroxine (SYNTHROID) 50 MCG tablet Take 25 mcg by mouth daily.     lidocaine  (LIDODERM ) 5 % Place 1 patch onto the skin daily. Remove & Discard patch within 12 hours or as directed by MD 10 patch 0   olmesartan  (BENICAR ) 20 MG tablet Take 20 mg by mouth daily.     rosuvastatin  (CRESTOR ) 10 MG tablet Take 10 mg by mouth 3 (three) times a week.      Coenzyme Q10 (CO Q 10 PO) Take 1 tablet by mouth daily.  (Patient not taking: Reported on 01/05/2024)     donepezil (ARICEPT) 5 MG tablet Take 5 mg by mouth at bedtime. (Patient not taking: Reported on 01/05/2024)     glucose blood (ONETOUCH ULTRA) test strip 1 each by Other route as needed. (Patient not taking: Reported on 01/05/2024)     No facility-administered medications prior to visit.    ROS Reviewed all systems and reported negative except as above     Objective:   Vitals:   01/05/24 1346  BP: 106/60  Pulse: 82  Temp: 97.6 F (36.4 C)  TempSrc: Oral  SpO2: 97%  Weight: 235 lb 3.2 oz (106.7 kg)  Height: 5' 5 (1.651 m)    Physical Exam General: Elderly female, ill-appearing Chest: Decreased air entry on the right lung base Heart: Regular rate and rhythm, normal S1, normal S2 Abdomen: Obese, nontender Neuro: Grossly intact, forgetful, no focal deficits Extremities: Trace edema with some venous stasis changes    CBC    Component Value Date/Time   WBC 10.6 (H) 08/26/2016 1737   RBC 4.58 08/26/2016  1737   HGB 12.5 08/26/2016 1737   HGB 11.8 07/13/2013 1017   HCT 38.5 08/26/2016 1737   HCT 36.1 07/13/2013 1017   PLT 218 08/26/2016 1737   PLT 254 07/13/2013 1017   MCV 84.1 08/26/2016 1737   MCV 81.9 07/13/2013 1017   MCH 27.3 08/26/2016 1737   MCHC 32.5 08/26/2016  1737   RDW 15.7 (H) 08/26/2016 1737   RDW 15.6 (H) 07/13/2013 1017   LYMPHSABS 1.9 07/13/2013 1017   MONOABS 0.8 07/13/2013 1017   EOSABS 0.1 07/13/2013 1017   BASOSABS 0.0 07/13/2013 1017     Chest imaging: I reviewed the CT chest performed on 12/24/2023: Partially loculated right pleural effusion.  With multiple pulmonary nodules measuring up to 9 mm and the right upper lobe.  I performed a bedside ultrasound of the right lung today.  She is noted to have simple appearing anechoic effusion without evidence of septation or tethering.     PFT: No PFTs on file  Assessment & Plan:   Assessment & Plan Pleural effusion Pleural effusion noted.  Recent infectious symptoms.  Likely this is a parapneumonic effusion.  Differential diagnoses include malignancy given multiple pulmonary nodules although the nodules appear small and could be in the setting of infection as well.  Other differential includes volume overload from heart failure which she is known to have history of although does not look overtly volume overloaded on exam today.  Plan to refer for IR thoracentesis.  Will obtain fluid analysis which would be helpful in determining cause of pleural effusion.  Her shortness of breath is likely in the setting of this pleural effusion.  I am hoping that her dyspnea on exertion improves when the fluid is drained. Orders:   IR THORACENTESIS ASP PLEURAL SPACE W/IMG GUIDE; Future   CT Chest Wo Contrast; Future  Pulmonary nodules Multiple pulmonary nodules, recent reported pneumonia.  Nodules likely in the setting of pneumonia.  Remote smoking history of less than half a pack a day for 14 years between the age of 53 and 79.   Low risk patient for pulmonary malignancy however age is a risk factor.  Will repeat CT scan in 3 months.  This will give us  a better picture as by then the infection would have been resolved.  Also hopefully by then her pleural effusion would be completely drained.      Zola Herter, MD [place] Office: [phone]

## 2024-01-11 ENCOUNTER — Telehealth: Payer: Self-pay

## 2024-01-11 NOTE — Telephone Encounter (Signed)
 Copied from CRM [id number]. Topic: Referral - Status >> Jan 11, 2024  8:10 AM Corean SAUNDERS wrote: Reason for CRM: Patients daughter [name] (dtr) [phone] is requesting a call back as the patients PCP is very worried about the patients breathing and fluid on her lungs but [facility] states Dr. Zaida sent a referral out for a pleural effusion but they have not received a call to schedule an appointment. Please call the patients daughter back to advise.

## 2024-01-13 NOTE — Telephone Encounter (Unsigned)
 Copied from CRM [id number]. Topic: Appointments - Scheduling Inquiry for Clinic >> Jan 13, 2024 11:49 AM Isabell A wrote: Reason for CRM: Patients PCP office Dr.Perini is requesting for patient to have thoracentesis - would like more information as to why this hasn't been scheduled.   Callback number: [phone]

## 2024-01-13 NOTE — Telephone Encounter (Signed)
 Called to discuss the Monday 01/17/24 9:00 am thoracentesis appointment scheduled at [facility] ----LVM for daughter [name] (on [place]) to return our call. Will reach out until we have spoken

## 2024-01-14 NOTE — Telephone Encounter (Signed)
 Copied from CRM [id number]. Topic: General - Other >> Jan 13, 2024  5:26 PM Rilla B wrote: Reason for CRM: Daughter [place], returning call to [place]. Please call daughter [phone].  This has been handled.

## 2024-01-14 NOTE — Telephone Encounter (Signed)
 Spoke with Vicki [name] (on DPR) regarding the thoracentesis scheduled Monday 01/17/24 at 9:00 am at [facility] --arrival time is 8:30 am----at entrance A.  [name] voice her understanding and was very appreciative for our call

## 2024-01-17 ENCOUNTER — Ambulatory Visit (HOSPITAL_COMMUNITY)
Admission: RE | Admit: 2024-01-17 | Discharge: 2024-01-17 | Disposition: A | Discharge status: Home / Self Care | Source: Ambulatory Visit

## 2024-01-17 ENCOUNTER — Ambulatory Visit (HOSPITAL_COMMUNITY)
Admission: RE | Admit: 2024-01-17 | Discharge: 2024-01-17 | Disposition: A | Source: Ambulatory Visit | Attending: Urology

## 2024-01-17 ENCOUNTER — Other Ambulatory Visit (HOSPITAL_COMMUNITY)

## 2024-01-17 ENCOUNTER — Other Ambulatory Visit (HOSPITAL_COMMUNITY): Payer: Self-pay | Admitting: Urology

## 2024-01-17 DIAGNOSIS — J90 Pleural effusion, not elsewhere classified: Secondary | ICD-10-CM | POA: Insufficient documentation

## 2024-01-17 HISTORY — PX: IR THORACENTESIS ASP PLEURAL SPACE W/IMG GUIDE: IMG5380

## 2024-01-17 LAB — BODY FLUID CELL COUNT WITH DIFFERENTIAL
Eos, Fluid: 0 %
Lymphs, Fluid: 70 %
Monocyte-Macrophage-Serous Fluid: 18 % — ABNORMAL LOW (ref 50–90)
Neutrophil Count, Fluid: 12 % (ref 0–25)
Total Nucleated Cell Count, Fluid: 1155 uL — ABNORMAL HIGH (ref 0–1000)

## 2024-01-17 LAB — LACTATE DEHYDROGENASE, PLEURAL OR PERITONEAL FLUID: LD, Fluid: 174 U/L — ABNORMAL HIGH (ref 3–23)

## 2024-01-17 LAB — ALBUMIN, PLEURAL OR PERITONEAL FLUID: Albumin, Fluid: 2 g/dL

## 2024-01-17 LAB — GLUCOSE, PLEURAL OR PERITONEAL FLUID: Glucose, Fluid: 152 mg/dL

## 2024-01-17 LAB — PROTEIN, PLEURAL OR PERITONEAL FLUID: Total protein, fluid: 4.3 g/dL

## 2024-01-17 MED ORDER — LIDOCAINE HCL 1 % IJ SOLN
INTRAMUSCULAR | Status: AC
  Filled 2024-01-17: qty 20

## 2024-01-17 MED ORDER — LIDOCAINE HCL 1 % IJ SOLN
20.0000 mL | Freq: Once | INTRAMUSCULAR | Status: AC
  Administered 2024-01-17: 10 mL

## 2024-01-17 NOTE — Procedures (Signed)
 PROCEDURE SUMMARY:  Successful image-guided right-sided diagnostic and therapeutic thoracentesis. Yielded 1.5 liters of clear, blood-laden pleural fluid. Patient tolerated procedure well. EBL: Zero No immediate complications.  Specimen was sent for labs. Post procedure CXR shows no pneumothorax.  Please see imaging section of Epic for full dictation.  Margrete Delude A Shonda Mandarino PA-C 01/17/2024 10:00 AM

## 2024-01-18 LAB — TRIGLYCERIDES, BODY FLUIDS: Triglycerides, Fluid: 24 mg/dL

## 2024-01-19 ENCOUNTER — Telehealth: Payer: Self-pay

## 2024-01-19 LAB — CYTOLOGY - NON PAP

## 2024-01-19 NOTE — Telephone Encounter (Signed)
 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 She would like me to speak with her daughter instead. I called her daughter and left a voicemail with a call back number to have her call me back.  Zola Herter, MD  Pulmonary & Critical Care Office: [phone]   See Amion for personal pager PCCM on call pager [phone] until 7pm. Please call Elink 7p-7a. [phone]

## 2024-01-20 ENCOUNTER — Encounter: Payer: Self-pay | Admitting: *Deleted

## 2024-01-20 ENCOUNTER — Telehealth: Payer: Self-pay

## 2024-01-20 DIAGNOSIS — C3490 Malignant neoplasm of unspecified part of unspecified bronchus or lung: Secondary | ICD-10-CM

## 2024-01-20 DIAGNOSIS — J90 Pleural effusion, not elsewhere classified: Secondary | ICD-10-CM

## 2024-01-20 LAB — BODY FLUID CULTURE W GRAM STAIN: Culture: NO GROWTH

## 2024-01-20 LAB — CHOLESTEROL, BODY FLUID: Cholesterol, Fluid: 90 mg/dL

## 2024-01-20 NOTE — Progress Notes (Signed)
 PATIENT NAVIGATOR PROGRESS NOTE  Name: [name] Date: 01/20/2024 MRN: [id number]  DOB: 02/11/1935   Reason for visit:  New pt appt  Comments:  Called and spoke with pt's daughter and have scheduled Ms Dupuis with Dr Autumn on Tuesday 10/21 at 1pm. Reviewed directions for building and parking as well as contact number to call with any questions      Time spent counseling/coordinating care: 30-45 minutes

## 2024-01-20 NOTE — Telephone Encounter (Signed)
 Copied from CRM #[id number]. Topic: Clinical - Lab/Test Results >> Jan 19, 2024 11:29 AM Isabell A wrote: Reason for CRM: Patients's daughter [name] returning phone call for results - advised by CAL to send CRM.  Callback number: [phone]  I called and spoke to pt's daughter, Teryl. (DPR) I informed Teryl that Dr Zaida would like to speak to her regarding her mother's results. I had verbally asked DR Zaida if he wanted to speak now and he agreed. I had transferred the daughter to Dr Zaida. NFN

## 2024-01-20 NOTE — Telephone Encounter (Signed)
 Called daughter back and updated about results of pleural fluid. Plan to get oncology consult for further management options.   Daughter is in agreement.  Zola Herter, MD [facility] Office: [phone]

## 2024-01-20 NOTE — Telephone Encounter (Signed)
 Patient daughter Vicki [name] returning call she received from dr zaida . Called cal; nurses are in a huddle . Please give patient daughter a call back [phone]

## 2024-01-25 ENCOUNTER — Inpatient Hospital Stay

## 2024-01-25 ENCOUNTER — Encounter: Payer: Self-pay | Admitting: Oncology

## 2024-01-25 ENCOUNTER — Inpatient Hospital Stay: Attending: Oncology | Admitting: Oncology

## 2024-01-25 VITALS — BP 126/53 | HR 62 | Temp 98.4°F | Resp 18 | Ht 65.0 in | Wt 230.3 lb

## 2024-01-25 DIAGNOSIS — J910 Malignant pleural effusion: Secondary | ICD-10-CM | POA: Diagnosis not present

## 2024-01-25 DIAGNOSIS — Z853 Personal history of malignant neoplasm of breast: Secondary | ICD-10-CM | POA: Diagnosis not present

## 2024-01-25 DIAGNOSIS — G4730 Sleep apnea, unspecified: Secondary | ICD-10-CM | POA: Diagnosis not present

## 2024-01-25 DIAGNOSIS — C3490 Malignant neoplasm of unspecified part of unspecified bronchus or lung: Secondary | ICD-10-CM

## 2024-01-25 DIAGNOSIS — I509 Heart failure, unspecified: Secondary | ICD-10-CM | POA: Diagnosis not present

## 2024-01-25 DIAGNOSIS — E1122 Type 2 diabetes mellitus with diabetic chronic kidney disease: Secondary | ICD-10-CM | POA: Diagnosis not present

## 2024-01-25 DIAGNOSIS — N183 Chronic kidney disease, stage 3 unspecified: Secondary | ICD-10-CM | POA: Insufficient documentation

## 2024-01-25 DIAGNOSIS — I130 Hypertensive heart and chronic kidney disease with heart failure and stage 1 through stage 4 chronic kidney disease, or unspecified chronic kidney disease: Secondary | ICD-10-CM | POA: Insufficient documentation

## 2024-01-25 HISTORY — DX: Malignant neoplasm of unspecified part of unspecified bronchus or lung: C34.90

## 2024-01-25 LAB — CMP (CANCER CENTER ONLY)
ALT: 31 U/L (ref 0–44)
AST: 28 U/L (ref 15–41)
Albumin: 3.2 g/dL — ABNORMAL LOW (ref 3.5–5.0)
Alkaline Phosphatase: 129 U/L — ABNORMAL HIGH (ref 38–126)
Anion gap: 10 (ref 5–15)
BUN: 39 mg/dL — ABNORMAL HIGH (ref 8–23)
CO2: 31 mmol/L (ref 22–32)
Calcium: 9.7 mg/dL (ref 8.9–10.3)
Chloride: 97 mmol/L — ABNORMAL LOW (ref 98–111)
Creatinine: 1.3 mg/dL — ABNORMAL HIGH (ref 0.44–1.00)
GFR, Estimated: 40 mL/min — ABNORMAL LOW (ref >60)
Glucose, Bld: 122 mg/dL — ABNORMAL HIGH (ref 70–99)
Potassium: 4.4 mmol/L (ref 3.5–5.1)
Sodium: 138 mmol/L (ref 135–145)
Total Bilirubin: 0.3 mg/dL (ref 0.0–1.2)
Total Protein: 7.5 g/dL (ref 6.5–8.1)

## 2024-01-25 LAB — CBC WITH DIFFERENTIAL (CANCER CENTER ONLY)
Abs Immature Granulocytes: 0.08 K/uL — ABNORMAL HIGH (ref 0.00–0.07)
Basophils Absolute: 0 K/uL (ref 0.0–0.1)
Basophils Relative: 0 %
Eosinophils Absolute: 0.1 K/uL (ref 0.0–0.5)
Eosinophils Relative: 1 %
HCT: 36.5 % (ref 36.0–46.0)
Hemoglobin: 11.7 g/dL — ABNORMAL LOW (ref 12.0–15.0)
Immature Granulocytes: 1 %
Lymphocytes Relative: 10 %
Lymphs Abs: 1.1 K/uL (ref 0.7–4.0)
MCH: 26.2 pg (ref 26.0–34.0)
MCHC: 32.1 g/dL (ref 30.0–36.0)
MCV: 81.7 fL (ref 80.0–100.0)
Monocytes Absolute: 0.9 K/uL (ref 0.1–1.0)
Monocytes Relative: 7 %
Neutro Abs: 9.8 K/uL — ABNORMAL HIGH (ref 1.7–7.7)
Neutrophils Relative %: 81 %
Platelet Count: 404 K/uL — ABNORMAL HIGH (ref 150–400)
RBC: 4.47 MIL/uL (ref 3.87–5.11)
RDW: 17.2 % — ABNORMAL HIGH (ref 11.5–15.5)
WBC Count: 12 K/uL — ABNORMAL HIGH (ref 4.0–10.5)
nRBC: 0 % (ref 0.0–0.2)

## 2024-01-25 LAB — LACTATE DEHYDROGENASE: LDH: 301 U/L — ABNORMAL HIGH (ref 98–192)

## 2024-01-25 MED ORDER — MEGESTROL ACETATE 625 MG/5ML PO SUSP: 625.0000 mg | Freq: Every day | ORAL | 0 refills | Status: DC

## 2024-01-25 NOTE — Assessment & Plan Note (Signed)
 Please review oncology history for additional details and timeline of events.  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 Lung nodules present bilaterally, largest 9 mm.   Cancer is advanced and not curable, but treatable. Treatment options depend on mutation testing results.   Potential mutations include EGFR, ALK, ROS1, PDL1, RET, and NTRK. If mutations are present, targeted therapy may be possible. If not, immunotherapy or chemotherapy may be considered. Goal is to manage symptoms, prevent fluid buildup, and control disease progression. Without treatment, life expectancy is less than six months. With treatment, survival may extend by at least one year, depending on mutation profile.  - Order PET scan and MRI of the brain for staging.  Because of her renal dysfunction, we will avoid contrast.  Also submitted NGS panel testing on the specimen and also liquid biopsy to look for any actionable mutations.  Her functional status precludes standard dose chemotherapy.  If there are any actionable mutations, we will treat her with targeted therapy versus immunotherapy.  If no actionable mutations, we will consider dose reduced chemotherapy along with immunotherapy.  - Arrange for thoracentesis as needed for symptom relief from pleural effusion.  - Continue dextromethorphan-based cough suppressant for cough management.  I will discuss results of staging workup and mutational testing over the phone in the next 2 weeks.  Plan to see her again in 4 weeks for follow-up.

## 2024-01-25 NOTE — Progress Notes (Signed)
 [facility]  ONCOLOGY CONSULT NOTE   PATIENT NAME: Vicki [name]   MR#: [id number] DOB: Feb 10, 1935  DATE OF SERVICE: 01/25/2024   REFERRING PROVIDER  Zola Herter, MD  Patient Care Team: Shayne Anes, MD as PCP - General (Internal Medicine) Nahser, Aleene PARAS, MD (Inactive) as PCP - Cardiology (Cardiology) Merrilyn Handler, MD (Inactive) as Surgeon (General Surgery) Cary Carlin ORN, MD (Inactive) (Obstetrics and Gynecology) Herter Zola SAILOR, MD as Consulting Physician (Pulmonary Disease)    CHIEF COMPLAINT/ PURPOSE OF CONSULTATION:   Lung adenocarcinoma, with malignant pleural effusion, stage IV A.  ASSESSMENT & PLAN:   Vicki [name] is a 89 y.o. lady with a past medical history of left sided breast cancer, diagnosed in 2010, treated with lumpectomy followed by adjuvant radiation and adjuvant hormonal therapy with letrozole  for 5 years, in remission, hypertension, diabetes mellitus, CKD stage III, sleep apnea, was referred to our clinic for recent diagnosis of lung adenocarcinoma with malignant pleural effusion.  Stage IV A disease.  Adenocarcinoma of lung [facility]) Please review oncology history for additional details and timeline of events.  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 Lung nodules present bilaterally, largest 9 mm.   Cancer is advanced and not curable, but treatable. Treatment options depend on mutation testing results.   Potential mutations include EGFR, ALK, ROS1, PDL1, RET, and NTRK. If mutations are present, targeted therapy may be possible. If not, immunotherapy or chemotherapy may be considered. Goal is to manage symptoms, prevent fluid buildup, and control disease progression. Without treatment, life expectancy is less than six months. With treatment, survival may extend by at least one year, depending on mutation profile.  - Order PET scan and MRI of the brain for  staging.  Because of her renal dysfunction, we will avoid contrast.  Also submitted NGS panel testing on the specimen and also liquid biopsy to look for any actionable mutations.  Her functional status precludes standard dose chemotherapy.  If there are any actionable mutations, we will treat her with targeted therapy versus immunotherapy.  If no actionable mutations, we will consider dose reduced chemotherapy along with immunotherapy.  - Arrange for thoracentesis as needed for symptom relief from pleural effusion.  - Continue dextromethorphan-based cough suppressant for cough management.  I will discuss results of staging workup and mutational testing over the phone in the next 2 weeks.  Plan to see her again in 4 weeks for follow-up.  Appetite loss and cough secondary to advanced lung cancer Appetite loss and cough are symptoms secondary to advanced lung cancer. Appetite loss is likely due to the cancer itself. Cough is a symptom of the cancer and may be managed with over-the-counter medications. - Prescribe Megace liquid as an appetite stimulant. - Recommend dextromethorphan-based cough suppressant.  Congestive heart failure Congestive heart failure is present and may impact treatment options. The impact of potential cancer treatments on heart failure will be considered once mutation testing results are available.  Chronic kidney disease Chronic kidney disease is noted, likely age-related. It will be considered in the context of treatment options for lung cancer.  I reviewed lab results and outside records for this visit and discussed relevant results with the patient. Diagnosis, plan of care and treatment options were also discussed in detail with the patient. Opportunity provided to ask questions and answers provided to her apparent satisfaction. Provided instructions to call our clinic with any problems, questions or concerns prior  to return visit. I recommended to continue follow-up  with PCP and sub-specialists. She verbalized understanding and agreed with the plan. No barriers to learning was detected.  NCCN guidelines have been consulted in the planning of this patient's care.  Vicki Patten, MD  01/25/2024 10:24 PM  [facility] - A DEPT OF [name] [address] [place] [address] Dept: [phone] Dept Fax: [phone]   HISTORY OF PRESENTING ILLNESS:   I have reviewed her chart and materials related to her cancer extensively and collaborated history with the patient. Summary of oncologic history is as follows:  ONCOLOGY HISTORY:  She began experiencing shortness of breath and congestion around May or June 2025, initially thought to be a cold. A physician assistant diagnosed her with pneumonia after a chest x-ray and prescribed antibiotics, prednisone , and an inhaler. Despite treatment, her symptoms did not improve, leading to further evaluation.  Subsequent chest x-rays showed persistent fluid in the lungs, prompting another course of antibiotics and a different inhaler, but her condition remained unchanged.  Eventually, she had a CT of the chest without contrast on 12/17/2023.  Which showed moderate-sized right pleural effusion, partially loculated in the major fissure.  Multiple new lung nodules bilaterally, largest of which was measuring 9 mm in the right upper lobe.  With these findings, she was referred to pulmonologist and was evaluated by Dr. Zaida.  Patient was evaluated by Dr. Zaida on 01/05/2024 and a referral sent to IR for paracentesis.  On 01/17/2024, she underwent ultrasound-guided thoracentesis with removal of 1.5 L of clear, bloody laden pleural fluid.  Cytology from the pleural fluid came back positive for non-small cell carcinoma.  Immunostains showed positivity for Napsin A, TTF-1 and cytokeratin 7.  Negative for CK20, CDX2, ER, GATA 3, cytokeratin 5/6.   Immunophenotype most consistent with lung adenocarcinoma.  With these findings, referral was sent to us  for further evaluation.  On her consultation with us  on 01/25/2024, we submitted request for staging PET scan and MRI of the brain.  Also submitted NGS panel testing on the specimen and also liquid biopsy to look for any actionable mutations.  Her functional status precludes standard dose chemotherapy.  If there are any actionable mutations, we will treat her with targeted therapy versus immunotherapy.  If no actionable mutations, we will consider dose reduced chemotherapy along with immunotherapy.  Oncology History  Adenocarcinoma of lung (HCC)  01/25/2024 Initial Diagnosis   Adenocarcinoma of lung (HCC)   01/25/2024 Cancer Staging   Staging form: Lung, AJCC V9 - Clinical stage from 01/25/2024: Stage IVA (cT4, cNX, pM1a) - Signed by [name] Chinita, MD on 01/25/2024 Stage prefix: Initial diagnosis     INTERVAL HISTORY:  Discussed the use of AI scribe software for clinical note transcription with the patient, who gave verbal consent to proceed.  History of Present Illness Leaira AIDALY CORDNER is an 89 year old female with stage four lung cancer who presents for oncology consultation following a diagnosis of malignant pleural effusion. She is accompanied by her daughter, [name] and her son, Toribio. She was referred by Dr. Zaida, a pulmonary specialist, following the discovery of cancer cells in pleural fluid.  She began experiencing shortness of breath and congestion around May or June 2025, initially thought to be a cold. A physician assistant diagnosed her with pneumonia after a chest x-ray and prescribed antibiotics, prednisone , and an inhaler. Despite treatment, her symptoms did not improve, leading to further  evaluation.  Subsequent chest x-rays showed persistent fluid in the lungs, prompting another course of antibiotics and a different inhaler, but her condition remained  unchanged. A CT scan revealed two lung nodules and moderate pleural effusion, leading to a referral to a pulmonary specialist, Dr. Zaida.  IR performed a thoracentesis, removing 1.5 liters of fluid, but noted more fluid remained. Analysis of the fluid revealed cancer cells, leading to a referral to oncology for further management.  She has a history of breast cancer diagnosed in 2010, treated with lumpectomy and radiation, followed by five years of Femara . She also has a history of congestive heart failure and a kidney cyst.  She experiences decreased appetite and difficulty sleeping. She takes baby aspirin  daily to minimize the risk of blood clots, a concern due to her lung cancer. Her current medications include an inhaler and baby aspirin . She experiences limited activity, primarily staying in bed, and has a persistent cough managed with over-the-counter medications like Robitussin D. No significant smoking history.   MEDICAL HISTORY:  Past Medical History:  Diagnosis Date   Angio-edema    Arthritis    osteo   Asthma    none in years   Bradycardia    Breast cancer (HCC) 2010   Left   Chronic kidney disease    states elevated creatnine- followed by Dr Shayne   Complication of anesthesia    following tympanoplasty in 1997- HAD TIGHTENING AROUND CHEST FOLLOWING ANESTHESIA'- states has done well x 2 since   Diabetes mellitus    borderline/diet controlled   GERD (gastroesophageal reflux disease)    Gout    Hypertension    Obesity    Personal history of radiation therapy    Scoliosis    Sleep apnea    states setting on 11- followed by Dr Willeen yearly- last study 7 years ago   Sleep apnea with use of continuous positive airway pressure (CPAP) 01/24/2013   Urticaria     SURGICAL HISTORY: Past Surgical History:  Procedure Laterality Date   BREAST LUMPECTOMY Left 07/2008   Left - Dr Merrilyn   CARPAL TUNNEL RELEASE     right   DILATION AND CURETTAGE OF UTERUS     IR  THORACENTESIS ASP PLEURAL SPACE W/IMG GUIDE  01/17/2024   SHOULDER OPEN ROTATOR CUFF REPAIR  05/27/2011   Procedure: ROTATOR CUFF REPAIR SHOULDER OPEN;  Surgeon: Tanda DELENA Heading, MD;  Location: [facility];  Service: Orthopedics;  Laterality: Right;  Right Shoulder Rotator Cuff Repair complex     TONSILLECTOMY     TYMPANOPLASTY  1997   right    SOCIAL HISTORY: She reports that she quit smoking about 62 years ago. Her smoking use included cigarettes. She has never used smokeless tobacco. She reports that she does not drink alcohol and does not use drugs. Social History   Socioeconomic History   Marital status: Married    Spouse name: Not on file   Number of children: Not on file   Years of education: Not on file   Highest education level: Not on file  Occupational History   Not on file  Tobacco Use   Smoking status: Former    Current packs/day: 0.00    Types: Cigarettes    Quit date: 04/06/1961    Years since quitting: 62.8   Smokeless tobacco: Never  Vaping Use   Vaping status: Never Used  Substance and Sexual Activity   Alcohol use: No   Drug use: No   Sexual activity:  Not Currently    Birth control/protection: Post-menopausal    Comment: 1st intercourse 89 yo-5 partners  Other Topics Concern   Not on file  Social History Narrative   Right handed, Married, 6 kids, Caffeine 1 cup daily, Retired,  [employer] grad,    Social Drivers of Corporate investment banker Strain: Not on file  Food Insecurity: No Food Insecurity (01/25/2024)   Hunger Vital Sign    Worried About Running Out of Food in the Last Year: Never true    Ran Out of Food in the Last Year: Never true  Transportation Needs: No Transportation Needs (01/25/2024)   PRAPARE - Administrator, Civil Service (Medical): No    Lack of Transportation (Non-Medical): No  Physical Activity: Not on file  Stress: Not on file  Social Connections: Not on file  Intimate Partner Violence: Not At Risk (01/25/2024)    Humiliation, Afraid, Rape, and Kick questionnaire    Fear of Current or Ex-Partner: No    Emotionally Abused: No    Physically Abused: No    Sexually Abused: No    FAMILY HISTORY: Family History  Problem Relation Age of Onset   Diabetes Mother    Breast cancer Sister        Age unknown   Diabetes Sister    Diabetes Brother    Breast cancer Maternal Grandmother        Age unknown    ALLERGIES:  She is allergic to shellfish allergy, sulfa antibiotics, elemental sulfur, and pantoprazole sodium.  MEDICATIONS:  Current Outpatient Medications  Medication Sig Dispense Refill   allopurinol  (ZYLOPRIM ) 300 MG tablet Take 150 mg by mouth daily. (Patient taking differently: Take 150 mg by mouth as needed.)     amLODipine  (NORVASC ) 5 MG tablet Take 5 mg by mouth daily.      aspirin  81 MG tablet Take 81 mg by mouth every other day.     carvedilol  (COREG ) 12.5 MG tablet Take 1 tablet (12.5 mg total) by mouth 2 (two) times daily with a meal. 180 tablet 3   chlorthalidone (HYGROTON) 25 MG tablet Take 12.5 mg by mouth daily.     Cholecalciferol (VITAMIN D ) 1000 UNITS capsule Take 1,000 Units by mouth daily.     Coenzyme Q10 (CO Q 10 PO) Take 1 tablet by mouth daily.  (Patient taking differently: Take 1 tablet by mouth daily. Takes once per week due to forgetfulness)     EPIPEN 2-PAK 0.3 MG/0.3ML DEVI 0.3 mg.      HYDROcodone -acetaminophen  (NORCO/VICODIN) 5-325 MG tablet Take 1-2 tablets by mouth every 6 (six) hours as needed for moderate pain. 10 tablet 0   levothyroxine (SYNTHROID) 50 MCG tablet Take 25 mcg by mouth daily.     lidocaine  (LIDODERM ) 5 % Place 1 patch onto the skin daily. Remove & Discard patch within 12 hours or as directed by MD 10 patch 0   megestrol (MEGACE ES) 625 MG/5ML suspension Take 5 mLs (625 mg total) by mouth daily. 150 mL 0   olmesartan  (BENICAR ) 20 MG tablet Take 20 mg by mouth daily.     rosuvastatin  (CRESTOR ) 10 MG tablet Take 10 mg by mouth 3 (three) times a week.       albuterol (VENTOLIN HFA) 108 (90 Base) MCG/ACT inhaler Inhale 2 puffs into the lungs every 4 (four) hours as needed (as needed).     glucose blood (ONETOUCH ULTRA) test strip 1 each by Other route as needed. (Patient not taking:  Reported on 01/25/2024)     No current facility-administered medications for this visit.    REVIEW OF SYSTEMS:    Review of Systems - Oncology  All other pertinent systems were reviewed with the patient and are negative.  PHYSICAL EXAMINATION:   Onc Performance Status - 01/25/24 1322       ECOG Perf Status   ECOG Perf Status Capable of only limited selfcare, confined to bed or chair more than 50% of waking hours      KPS SCALE   KPS % SCORE Requires considerable assistance, and frequent medical care          Vitals:   01/25/24 1304  BP: (!) 126/53  Pulse: 62  Resp: 18  Temp: 98.4 F (36.9 C)  SpO2: 94%   Filed Weights   01/25/24 1304  Weight: 230 lb 4.8 oz (104.5 kg)    Physical Exam Constitutional:      General: She is not in acute distress.    Comments: Presented to clinic in wheelchair  HENT:     Head: Normocephalic and atraumatic.  Cardiovascular:     Rate and Rhythm: Normal rate.  Pulmonary:     Effort: Pulmonary effort is normal. No respiratory distress.     Comments: Decreased breath sounds in the right base Abdominal:     General: There is no distension.  Neurological:     General: No focal deficit present.     Mental Status: She is alert and oriented to person, place, and time.  Psychiatric:        Mood and Affect: Mood normal.        Behavior: Behavior normal.      LABORATORY DATA:   I have reviewed the data as listed.  Results for orders placed or performed in visit on 01/25/24  Lactate dehydrogenase  Result Value Ref Range   LDH 301 (H) 98 - 192 U/L  CMP (Cancer Center only)  Result Value Ref Range   Sodium 138 135 - 145 mmol/L   Potassium 4.4 3.5 - 5.1 mmol/L   Chloride 97 (L) 98 - 111 mmol/L   CO2  31 22 - 32 mmol/L   Glucose, Bld 122 (H) 70 - 99 mg/dL   BUN 39 (H) 8 - 23 mg/dL   Creatinine 8.69 (H) 9.55 - 1.00 mg/dL   Calcium  9.7 8.9 - 10.3 mg/dL   Total Protein 7.5 6.5 - 8.1 g/dL   Albumin 3.2 (L) 3.5 - 5.0 g/dL   AST 28 15 - 41 U/L   ALT 31 0 - 44 U/L   Alkaline Phosphatase 129 (H) 38 - 126 U/L   Total Bilirubin 0.3 0.0 - 1.2 mg/dL   GFR, Estimated 40 (L) >60 mL/min   Anion gap 10 5 - 15  CBC with Differential (Cancer Center Only)  Result Value Ref Range   WBC Count 12.0 (H) 4.0 - 10.5 K/uL   RBC 4.47 3.87 - 5.11 MIL/uL   Hemoglobin 11.7 (L) 12.0 - 15.0 g/dL   HCT 63.4 63.9 - 53.9 %   MCV 81.7 80.0 - 100.0 fL   MCH 26.2 26.0 - 34.0 pg   MCHC 32.1 30.0 - 36.0 g/dL   RDW 82.7 (H) 88.4 - 84.4 %   Platelet Count 404 (H) 150 - 400 K/uL   nRBC 0.0 0.0 - 0.2 %   Neutrophils Relative % 81 %   Neutro Abs 9.8 (H) 1.7 - 7.7 K/uL   Lymphocytes Relative 10 %  Lymphs Abs 1.1 0.7 - 4.0 K/uL   Monocytes Relative 7 %   Monocytes Absolute 0.9 0.1 - 1.0 K/uL   Eosinophils Relative 1 %   Eosinophils Absolute 0.1 0.0 - 0.5 K/uL   Basophils Relative 0 %   Basophils Absolute 0.0 0.0 - 0.1 K/uL   Immature Granulocytes 1 %   Abs Immature Granulocytes 0.08 (H) 0.00 - 0.07 K/uL    RADIOGRAPHIC STUDIES:  I have personally reviewed the radiological images as listed and agree with the findings in the report.  IR THORACENTESIS ASP PLEURAL SPACE W/IMG GUIDE Result Date: 01/17/2024 INDICATION: 89 year old female with recent pneumonia, in new onset right pleural effusion. IR was requested for diagnostic and therapeutic thoracentesis. EXAM: ULTRASOUND GUIDED DIAGNOSTIC AND THERAPEUTIC RIGHT-SIDED THORACENTESIS MEDICATIONS: 4 cc of 1% lidocaine . COMPLICATIONS: None immediate. PROCEDURE: An ultrasound guided thoracentesis was thoroughly discussed with the patient and questions answered. The benefits, risks, alternatives and complications were also discussed. The patient understands and wishes to  proceed with the procedure. Written consent was obtained. Ultrasound was performed to localize and mark an adequate pocket of fluid in the right chest. The area was then prepped and draped in the normal sterile fashion. 1% Lidocaine  was used for local anesthesia. Under ultrasound guidance a 6 Fr Safe-T-Centesis catheter was introduced. Thoracentesis was performed. The catheter was removed and a dressing applied. FINDINGS: A total of approximately 1.5 L of clear, blood-laden pleural fluid was removed. Samples were sent to the laboratory as requested by the clinical team. IMPRESSION: Successful ultrasound guided right thoracentesis yielding 1.5 L of pleural fluid. Procedure performed by Carlin Griffon, PA-C Electronically Signed   By: Juliene Balder M.D.   On: 01/17/2024 10:34   DG Chest 1 View Result Date: 01/17/2024 CLINICAL DATA:  89 year old female status post right thoracentesis. EXAM: CHEST  1 VIEW COMPARISON:  Prior CT scan of the chest 12/17/2023 FINDINGS: Stable cardiac and mediastinal contours. Very low inspiratory volumes with bibasilar atelectasis. No evidence of pneumothorax. Probable residual right lower lobe atelectasis and perhaps small effusion. IMPRESSION: No evidence of pneumothorax or other complication following right-sided thoracentesis. Electronically Signed   By: Wilkie Lent M.D.   On: 01/17/2024 10:14    Orders Placed This Encounter  Procedures   NM PET Image Initial (PI) Skull Base To Thigh    Standing Status:   Future    Expected Date:   01/31/2024    Expiration Date:   01/24/2025    If indicated for the ordered procedure, I authorize the administration of a radiopharmaceutical per Radiology protocol:   Yes    Preferred imaging location?:   [facility]   MR Brain Wo Contrast    Standing Status:   Future    Expected Date:   01/31/2024    Expiration Date:   01/24/2025    What is the patient's sedation requirement?:   No Sedation    Does the patient have a pacemaker or  implanted devices?:   No    Use SRS Protocol?:   No    Preferred imaging location?:   [facility] (table limit - 500lbs)   CBC with Differential (Cancer Center Only)    Standing Status:   Future    Number of Occurrences:   1    Expiration Date:   01/24/2025   CMP (Cancer Center only)    Standing Status:   Future    Number of Occurrences:   1    Expiration Date:   01/24/2025  Lactate dehydrogenase    Standing Status:   Future    Number of Occurrences:   1    Expiration Date:   01/24/2025   Guardant 360    Standing Status:   Future    Number of Occurrences:   1    Expiration Date:   01/24/2025    CODE STATUS:  Code Status History     Date Active Date Inactive Code Status Order ID Comments User Context   05/27/2011 1605 05/28/2011 1729 Full Code [id number]  Gretta Norberta Bare, RN Inpatient       Future Appointments  Date Time Provider Department Center  02/08/2024  3:15 PM Autumn Millman, MD [facility] None  02/28/2024  2:15 PM [facility] PHLEBOTOMIST [facility] None  02/28/2024  2:45 PM Dinari Stgermaine, MD [facility] None  03/10/2024  3:00 PM [facility] 1 [facility] [facility]  11/13/2024  3:30 PM Dohmeier, Dedra, MD [facility] None     I spent a total of 70 minutes during this encounter with the patient including review of chart and various tests results, discussions about plan of care and coordination of care plan.  This document was completed utilizing speech recognition software. Grammatical errors, random word insertions, pronoun errors, and incomplete sentences are an occasional consequence of this system due to software limitations, ambient noise, and hardware issues. Any formal questions or concerns about the content, text or information contained within the body of this dictation should be directly addressed to the provider for clarification.

## 2024-02-04 ENCOUNTER — Ambulatory Visit (HOSPITAL_COMMUNITY)
Admission: RE | Admit: 2024-02-04 | Discharge: 2024-02-04 | Disposition: A | Discharge status: Home / Self Care | Source: Ambulatory Visit | Attending: Oncology | Admitting: Oncology

## 2024-02-04 ENCOUNTER — Encounter (HOSPITAL_COMMUNITY)
Admission: RE | Admit: 2024-02-04 | Discharge: 2024-02-04 | Disposition: A | Discharge status: Home / Self Care | Source: Ambulatory Visit | Attending: Oncology | Admitting: Oncology

## 2024-02-04 DIAGNOSIS — C3490 Malignant neoplasm of unspecified part of unspecified bronchus or lung: Secondary | ICD-10-CM | POA: Diagnosis present

## 2024-02-04 LAB — GLUCOSE, CAPILLARY: Glucose-Capillary: 107 mg/dL — ABNORMAL HIGH (ref 70–99)

## 2024-02-04 MED ORDER — FLUDEOXYGLUCOSE F - 18 (FDG) INJECTION
11.4100 | Freq: Once | INTRAVENOUS | Status: AC | PRN
Start: 2024-02-04 — End: 2024-02-04
  Administered 2024-02-04: 11.41 via INTRAVENOUS

## 2024-02-06 ENCOUNTER — Emergency Department (HOSPITAL_COMMUNITY)

## 2024-02-06 ENCOUNTER — Other Ambulatory Visit: Payer: Self-pay

## 2024-02-06 ENCOUNTER — Emergency Department (HOSPITAL_COMMUNITY)
Admission: EM | Admit: 2024-02-06 | Discharge: 2024-02-06 | Disposition: A | Discharge status: Home / Self Care | Attending: Emergency Medicine | Admitting: Emergency Medicine

## 2024-02-06 DIAGNOSIS — S01311A Laceration without foreign body of right ear, initial encounter: Secondary | ICD-10-CM | POA: Insufficient documentation

## 2024-02-06 DIAGNOSIS — Z7982 Long term (current) use of aspirin: Secondary | ICD-10-CM | POA: Insufficient documentation

## 2024-02-06 DIAGNOSIS — F0390 Unspecified dementia without behavioral disturbance: Secondary | ICD-10-CM | POA: Insufficient documentation

## 2024-02-06 DIAGNOSIS — W1830XA Fall on same level, unspecified, initial encounter: Secondary | ICD-10-CM | POA: Diagnosis not present

## 2024-02-06 DIAGNOSIS — S0101XA Laceration without foreign body of scalp, initial encounter: Secondary | ICD-10-CM | POA: Diagnosis not present

## 2024-02-06 DIAGNOSIS — W19XXXA Unspecified fall, initial encounter: Secondary | ICD-10-CM

## 2024-02-06 DIAGNOSIS — D72829 Elevated white blood cell count, unspecified: Secondary | ICD-10-CM | POA: Diagnosis not present

## 2024-02-06 DIAGNOSIS — R519 Headache, unspecified: Secondary | ICD-10-CM | POA: Diagnosis present

## 2024-02-06 LAB — COMPREHENSIVE METABOLIC PANEL WITH GFR
ALT: 20 U/L (ref 0–44)
AST: 24 U/L (ref 15–41)
Albumin: 3 g/dL — ABNORMAL LOW (ref 3.5–5.0)
Alkaline Phosphatase: 85 U/L (ref 38–126)
Anion gap: 10 (ref 5–15)
BUN: 33 mg/dL — ABNORMAL HIGH (ref 8–23)
CO2: 23 mmol/L (ref 22–32)
Calcium: 8.9 mg/dL (ref 8.9–10.3)
Chloride: 104 mmol/L (ref 98–111)
Creatinine, Ser: 1.61 mg/dL — ABNORMAL HIGH (ref 0.44–1.00)
GFR, Estimated: 31 mL/min — ABNORMAL LOW (ref >60)
Glucose, Bld: 106 mg/dL — ABNORMAL HIGH (ref 70–99)
Potassium: 4.7 mmol/L (ref 3.5–5.1)
Sodium: 137 mmol/L (ref 135–145)
Total Bilirubin: 0.2 mg/dL (ref 0.0–1.2)
Total Protein: 6.8 g/dL (ref 6.5–8.1)

## 2024-02-06 LAB — CBC WITH DIFFERENTIAL/PLATELET
Abs Immature Granulocytes: 0.1 K/uL — ABNORMAL HIGH (ref 0.00–0.07)
Basophils Absolute: 0.1 K/uL (ref 0.0–0.1)
Basophils Relative: 0 %
Eosinophils Absolute: 0.1 K/uL (ref 0.0–0.5)
Eosinophils Relative: 1 %
HCT: 33.8 % — ABNORMAL LOW (ref 36.0–46.0)
Hemoglobin: 10.4 g/dL — ABNORMAL LOW (ref 12.0–15.0)
Immature Granulocytes: 1 %
Lymphocytes Relative: 8 %
Lymphs Abs: 1.2 K/uL (ref 0.7–4.0)
MCH: 26.1 pg (ref 26.0–34.0)
MCHC: 30.8 g/dL (ref 30.0–36.0)
MCV: 84.7 fL (ref 80.0–100.0)
Monocytes Absolute: 1.2 K/uL — ABNORMAL HIGH (ref 0.1–1.0)
Monocytes Relative: 8 %
Neutro Abs: 13.3 K/uL — ABNORMAL HIGH (ref 1.7–7.7)
Neutrophils Relative %: 82 %
Platelets: 348 K/uL (ref 150–400)
RBC: 3.99 MIL/uL (ref 3.87–5.11)
RDW: 18.2 % — ABNORMAL HIGH (ref 11.5–15.5)
WBC: 16 K/uL — ABNORMAL HIGH (ref 4.0–10.5)
nRBC: 0 % (ref 0.0–0.2)

## 2024-02-06 MED ORDER — LIDOCAINE-EPINEPHRINE-TETRACAINE (LET) TOPICAL GEL
3.0000 mL | Freq: Once | TOPICAL | Status: AC
  Administered 2024-02-06: 3 mL via TOPICAL
  Filled 2024-02-06: qty 3

## 2024-02-06 MED ORDER — CIPROFLOXACIN HCL 250 MG PO TABS: 250.0000 mg | ORAL_TABLET | Freq: Two times a day (BID) | ORAL | 0 refills | Status: AC

## 2024-02-06 NOTE — ED Provider Notes (Signed)
 [place] [facility] DEPARTMENT AT [facility] Provider Note   CSN: [id number] Arrival date & time: 02/06/24  1253     Patient presents with: Felton Been JULISSA BROWNING is a 89 y.o. female.  {Add pertinent medical, surgical, social history, OB history to HPI:343} 89 year old female undergoing workup for dementia and possible lung cancer who presents emergency department after a fall.  History obtained per the patient, her daughter, and her husband.  She is going to the restroom with the assistance of her husband when they report that she fell down.  Was on the ground for 20 to 30 minutes total.  It is unclear if she lost consciousness during this event.  No preceding symptoms.  She complaining of headache and right knee pain at this point in time.  On aspirin  but no other blood thinners.  Per daughter has been more weak than usual for several months.  They recently found out that she has lung cancer.  Underwent a PET scan on 10/31 that shows a right upper lobe nodule with pleural involvement and lymph node involvement       Prior to Admission medications   Medication Sig Start Date End Date Taking? Authorizing Provider  albuterol (VENTOLIN HFA) 108 (90 Base) MCG/ACT inhaler Inhale 2 puffs into the lungs every 4 (four) hours as needed (as needed). 10/27/22   [provider]  allopurinol  (ZYLOPRIM ) 300 MG tablet Take 150 mg by mouth daily. Patient taking differently: Take 150 mg by mouth as needed.    [provider]  amLODipine  (NORVASC ) 5 MG tablet Take 5 mg by mouth daily.  08/06/16   [provider]  aspirin  81 MG tablet Take 81 mg by mouth every other day.    [provider]  carvedilol  (COREG ) 12.5 MG tablet Take 1 tablet (12.5 mg total) by mouth 2 (two) times daily with a meal. 11/17/23   Court Dorn PARAS, MD  chlorthalidone (HYGROTON) 25 MG tablet Take 12.5 mg by mouth daily.    [provider]  Cholecalciferol (VITAMIN D ) 1000  UNITS capsule Take 1,000 Units by mouth daily.    [provider]  Coenzyme Q10 (CO Q 10 PO) Take 1 tablet by mouth daily.  Patient taking differently: Take 1 tablet by mouth daily. Takes once per week due to forgetfulness    [provider]  EPIPEN 2-PAK 0.3 MG/0.3ML DEVI 0.3 mg.  04/23/12   [provider]  glucose blood (ONETOUCH ULTRA) test strip 1 each by Other route as needed. Patient not taking: Reported on 01/25/2024 07/30/20   [provider]  HYDROcodone -acetaminophen  (NORCO/VICODIN) 5-325 MG tablet Take 1-2 tablets by mouth every 6 (six) hours as needed for moderate pain. 05/29/15   Zackowski, Scott, MD  levothyroxine (SYNTHROID) 50 MCG tablet Take 25 mcg by mouth daily. 11/01/18   [provider]  lidocaine  (LIDODERM ) 5 % Place 1 patch onto the skin daily. Remove & Discard patch within 12 hours or as directed by MD 12/18/17   Palumbo, April, MD  megestrol (MEGACE ES) 625 MG/5ML suspension Take 5 mLs (625 mg total) by mouth daily. 01/25/24   Pasam, Chinita, MD  olmesartan  (BENICAR ) 20 MG tablet Take 20 mg by mouth daily. 12/07/19   [provider]  rosuvastatin  (CRESTOR ) 10 MG tablet Take 10 mg by mouth 3 (three) times a week.     [provider]    Allergies: Shellfish allergy, Sulfa antibiotics, Elemental sulfur, and Pantoprazole sodium  Review of Systems  Updated Vital Signs BP 128/74 (BP Location: Right Arm)   Pulse 81   Temp 97.9 F (36.6 C) (Oral)   Resp 18   Ht 5' 5 (1.651 m)   Wt 102.1 kg   SpO2 98%   BMI 37.44 kg/m   Physical Exam Constitutional:      Appearance: Normal appearance.  HENT:     Head: Normocephalic.     Comments: 2 cm laceration to forehead.  See image below    Right Ear: External ear normal.     Left Ear: External ear normal.     Nose: Nose normal.     Mouth/Throat:     Mouth: Mucous membranes are moist.     Pharynx: Oropharynx is clear.  Eyes:     Extraocular Movements:  Extraocular movements intact.     Conjunctiva/sclera: Conjunctivae normal.     Pupils: Pupils are equal, round, and reactive to light.     Comments: Pupils 5 mm bilaterally  Neck:     Comments: In c-collar Cardiovascular:     Rate and Rhythm: Normal rate and regular rhythm.     Pulses: Normal pulses.     Heart sounds: Normal heart sounds.  Pulmonary:     Effort: Pulmonary effort is normal.     Breath sounds: Normal breath sounds.  Abdominal:     General: There is no distension.     Palpations: There is no mass.     Tenderness: There is no abdominal tenderness. There is no guarding.  Musculoskeletal:     Comments: No tenderness palpation of bilateral clavicles, chest wall, elbows, wrists, hips, or ankles.  Tenderness palpation of right knee with limited range of motion.  Small effusion noted.  No erythema or warmth over the knee.  Neurological:     Mental Status: She is alert. Mental status is at baseline.     Cranial Nerves: No cranial nerve deficit.     Sensory: No sensory deficit.     Motor: No weakness.     (all labs ordered are listed, but only abnormal results are displayed) Labs Reviewed  CBC WITH DIFFERENTIAL/PLATELET  COMPREHENSIVE METABOLIC PANEL WITH GFR    EKG: None  Radiology: No results found.  {Document cardiac monitor, telemetry assessment procedure when appropriate:32947} Procedures   Medications Ordered in the ED  lidocaine -EPINEPHrine-tetracaine (LET) topical gel (3 mLs Topical Given 02/06/24 1311)    Clinical Course as of 02/06/24 2339  Sun Feb 06, 2024  1357 Repeat EKG having difficulty crossing over into MUSE.  Shows sinus rhythm with short PR interval.  No ST changes or pathologic Q waves. [RP]  1532 Creatinine(!): 1.61 At baseline [RP]  1532 WBC(!): 16.0 Does have chronic leukocytosis but typically is around 12.  Not having any infectious symptoms at this time [RP]  1532 Hemoglobin(!): 10.4 Baseline of 12 [RP]    Clinical Course User  Index [RP] Yolande Lamar BROCKS, MD   {Click here for ABCD2, HEART and other calculators REFRESH Note before signing:1}                              Medical Decision Making Amount and/or Complexity of Data Reviewed Labs: ordered. Decision-making details documented in ED Course. Radiology: ordered.  Risk Prescription drug management.   ***  {Document critical care time when appropriate  Document review of labs and clinical decision tools ie CHADS2VASC2, etc  Document your independent review of  radiology images and any outside records  Document your discussion with family members, caretakers and with consultants  Document social determinants of health affecting pt's care  Document your decision making why or why not admission, treatments were needed:32947:::1}   Final diagnoses:  None    ED Discharge Orders     None

## 2024-02-06 NOTE — ED Triage Notes (Signed)
 Patient to ED from home following a fall. Per EMS she had unwitnessed fall in her bathroom causing laceration to forehead. She has HX of dementia, keeps asking staff why is she here. EMS states she is not on thinners but takes daily ASA. She c/o slight headache.

## 2024-02-06 NOTE — ED Notes (Signed)
 Patient was able to ambulate with walker with assistance a short distance with no complaints of chest discomfort or SOB.

## 2024-02-06 NOTE — ED Provider Notes (Signed)
 Physical Exam  BP 128/74 (BP Location: Right Arm)   Pulse 81   Temp 97.9 F (36.6 C) (Oral)   Resp 18   Ht 5' 5 (1.651 m)   Wt 102.1 kg   SpO2 98%   BMI 37.44 kg/m     Procedures  .Laceration Repair  Date/Time: 02/06/2024 2:44 PM  Performed by: Myriam Fonda RAMAN, PA-C Authorized by: Myriam Fonda RAMAN, PA-C   Consent:    Consent obtained:  Verbal   Consent given by:  Patient   Risks, benefits, and alternatives were discussed: yes     Risks discussed:  Infection, pain, tendon damage, retained foreign body, poor cosmetic result, need for additional repair, nerve damage, poor wound healing and vascular damage   Alternatives discussed:  No treatment and delayed treatment Universal protocol:    Procedure explained and questions answered to patient or proxy's satisfaction: yes     Relevant documents present and verified: yes     Test results available: yes     Imaging studies available: yes     Site/side marked: yes     Immediately prior to procedure, a time out was called: yes     Patient identity confirmed:  Verbally with patient Anesthesia:    Anesthesia method:  Topical application   Topical anesthetic:  LET Laceration details:    Location:  Face   Face location:  Forehead   Length (cm):  3   Depth (mm):  3 Pre-procedure details:    Preparation:  Patient was prepped and draped in usual sterile fashion Treatment:    Area cleansed with:  Saline   Amount of cleaning:  Standard   Irrigation solution:  Sterile saline   Irrigation volume:  50mL   Irrigation method:  Syringe Skin repair:    Repair method:  Sutures   Suture size:  4-0   Suture material:  Prolene   Suture technique:  Simple interrupted   Number of sutures:  4 Approximation:    Approximation:  Close Repair type:    Repair type:  Simple Post-procedure details:    Procedure completion:  Tolerated well, no immediate complications .Laceration Repair  Date/Time: 02/06/2024 4:26 PM  Performed by: Myriam Fonda RAMAN, PA-C Authorized by: Myriam Fonda RAMAN, PA-C   Consent:    Consent obtained:  Verbal   Consent given by:  Patient   Risks, benefits, and alternatives were discussed: yes     Risks discussed:  Infection, need for additional repair, nerve damage, poor wound healing, poor cosmetic result, pain, retained foreign body, tendon damage and vascular damage   Alternatives discussed:  No treatment Universal protocol:    Procedure explained and questions answered to patient or proxy's satisfaction: yes     Relevant documents present and verified: yes     Test results available: yes     Site/side marked: yes     Patient identity confirmed:  Verbally with patient and arm band Anesthesia:    Anesthesia method:  Topical application   Topical anesthetic:  LET Laceration details:    Location:  Ear   Ear location:  R ear   Length (cm):  3   Depth (mm):  4 Pre-procedure details:    Preparation:  Patient was prepped and draped in usual sterile fashion Exploration:    Limited defect created (wound extended): no   Treatment:    Area cleansed with:  Saline   Amount of cleaning:  Extensive   Irrigation solution:  Sterile saline  Irrigation volume:    Irrigation method:  Syringe   Debridement:  None Skin repair:    Repair method:  Sutures   Suture size:  6-0   Suture material:  Prolene   Suture technique:  Simple interrupted   Number of sutures:  6 Approximation:    Approximation:  Close Repair type:    Repair type:  Simple Post-procedure details:    Procedure completion:  [place] Myriam Fonda GORMAN, PA-C 02/06/24 1631    Yolande Lamar BROCKS, MD 02/09/24 1110

## 2024-02-06 NOTE — Discharge Instructions (Signed)
 You were seen for your head injury in the emergency department.   At home, please ice your forehead and take Tylenol  for your pain.  Take the antibiotics to prevent an ear infection.  Have the stitches in your forehead removed in 1 week.  Have the stitches in your ear removed in 10 to 14 days  Check your MyChart online for the results of any tests that had not resulted by the time you left the emergency department.   Follow-up with your primary doctor in 2-3 days regarding your visit.  Follow-up with the ear nose and throat doctors about your ear  Return immediately to the emergency department if you experience any of the following: Severe headache, vomiting, or any other concerning symptoms.    Thank you for visiting our Emergency Department. It was a pleasure taking care of you today.

## 2024-02-08 ENCOUNTER — Inpatient Hospital Stay: Attending: Oncology | Admitting: Oncology

## 2024-02-08 DIAGNOSIS — J910 Malignant pleural effusion: Secondary | ICD-10-CM | POA: Insufficient documentation

## 2024-02-08 DIAGNOSIS — C3490 Malignant neoplasm of unspecified part of unspecified bronchus or lung: Secondary | ICD-10-CM | POA: Diagnosis not present

## 2024-02-08 DIAGNOSIS — R530 Neoplastic (malignant) related fatigue: Secondary | ICD-10-CM | POA: Insufficient documentation

## 2024-02-08 DIAGNOSIS — I509 Heart failure, unspecified: Secondary | ICD-10-CM | POA: Insufficient documentation

## 2024-02-08 DIAGNOSIS — C3411 Malignant neoplasm of upper lobe, right bronchus or lung: Secondary | ICD-10-CM | POA: Insufficient documentation

## 2024-02-08 DIAGNOSIS — I130 Hypertensive heart and chronic kidney disease with heart failure and stage 1 through stage 4 chronic kidney disease, or unspecified chronic kidney disease: Secondary | ICD-10-CM | POA: Insufficient documentation

## 2024-02-08 DIAGNOSIS — N183 Chronic kidney disease, stage 3 unspecified: Secondary | ICD-10-CM | POA: Insufficient documentation

## 2024-02-08 NOTE — Progress Notes (Unsigned)
 [facility]  HEMATOLOGY-ONCOLOGY ELECTRONIC VISIT PROGRESS NOTE  PATIENT NAME: Vicki [name]   MR#: [id number] DOB: [birth year]  DATE OF SERVICE: 02/08/2024  Patient Care Team: Shayne Anes, MD as PCP - General (Internal Medicine) Nahser, Aleene PARAS, MD (Inactive) as PCP - Cardiology (Cardiology) Merrilyn Handler, MD (Inactive) as Surgeon (General Surgery) Lomax, Carlin ORN, MD (Inactive) (Obstetrics and Gynecology) Zaida, Zola SAILOR, MD as Consulting Physician (Pulmonary Disease)  I connected with the patient via telephone conference and verified that I am speaking with the correct person using two identifiers. The patient's location is at home and I am providing care from the [facility].  I discussed the limitations, risks, security and privacy concerns of performing an evaluation and management service by e-visits and the availability of in person appointments. I also discussed with the patient that there may be a patient responsible charge related to this service. The patient expressed understanding and agreed to proceed.   ASSESSMENT & PLAN:   Vicki [name] is a 89 y.o. lady with a past medical history of left sided breast cancer, diagnosed in 2010, treated with lumpectomy followed by adjuvant radiation and adjuvant hormonal therapy with letrozole  for 5 years, in remission, hypertension, diabetes mellitus, CKD stage III, sleep apnea, was referred to our clinic for recent diagnosis of lung adenocarcinoma with malignant pleural effusion.  Stage IV A disease.   No problem-specific Assessment & Plan notes found for this encounter.   Assessment and Plan Assessment & Plan       I discussed the assessment and treatment plan with the patient. The patient was provided an opportunity to ask questions and all were answered. The patient agreed with the plan and demonstrated an understanding of the instructions. The patient was advised to call back or seek an in-person  evaluation if the symptoms worsen or if the condition fails to improve as anticipated.    I spent *** minutes over the phone with the patient reviewing test results, discuss management and coordination/planning of care.  Vicki Patten, MD 02/08/2024 3:34 PM [facility] - A DEPT OF [name] [address] [place] [address] Dept: [phone] Dept Fax: [phone]   INTERVAL HISTORY:  Please see above for problem oriented charting.  The purpose of today's discussion is to explain recent lab results and to formulate plan of care.  Discussed the use of AI scribe software for clinical note transcription with the patient, who gave verbal consent to proceed.  History of Present Illness      ***  ONCOLOGY HISTORY:   She began experiencing shortness of breath and congestion around May or June 2025, initially thought to be a cold. A physician assistant diagnosed her with pneumonia after a chest x-ray and prescribed antibiotics, prednisone , and an inhaler. Despite treatment, her symptoms did not improve, leading to further evaluation.   Subsequent chest x-rays showed persistent fluid in the lungs, prompting another course of antibiotics and a different inhaler, but her condition remained unchanged.   Eventually, she had a CT of the chest without contrast on 12/17/2023.  Which showed moderate-sized right pleural effusion, partially loculated in the major fissure.  Multiple new lung nodules bilaterally, largest of which was measuring 9 mm in the right upper lobe.   With these findings, she was referred to pulmonologist and was evaluated by Dr. Zaida.  Patient was evaluated by Dr. Zaida on 01/05/2024 and a referral sent to  IR for paracentesis.   On 01/17/2024, she underwent ultrasound-guided thoracentesis with removal of 1.5 L of clear, bloody laden pleural fluid.  Cytology from the pleural fluid came back  positive for non-small cell carcinoma.  Immunostains showed positivity for Napsin A, TTF-1 and cytokeratin 7.  Negative for CK20, CDX2, ER, GATA 3, cytokeratin 5/6.  Immunophenotype most consistent with lung adenocarcinoma.   With these findings, referral was sent to us  for further evaluation.   On her consultation with us  on 01/25/2024, we submitted request for staging PET scan and MRI of the brain.  Also submitted NGS panel testing on the specimen and also liquid biopsy to look for any actionable mutations.   Her functional status precludes standard dose chemotherapy.  If there are any actionable mutations, we will treat her with targeted therapy versus immunotherapy.  If no actionable mutations, we will consider dose reduced chemotherapy along with immunotherapy.  Oncology History  Adenocarcinoma of lung (HCC)  01/25/2024 Initial Diagnosis   Adenocarcinoma of lung (HCC)   01/25/2024 Cancer Staging   Staging form: Lung, AJCC V9 - Clinical stage from 01/25/2024: Stage IVA (cT4, cNX, pM1a) - Signed by Autumn Millman, MD on 01/25/2024 Stage prefix: Initial diagnosis     REVIEW OF SYSTEMS:    Review of Systems - Oncology  All other pertinent systems were reviewed with the patient and are negative.  I have reviewed the past medical history, past surgical history, social history and family history with the patient and they are unchanged from previous note.  ALLERGIES:  She is allergic to shellfish allergy, sulfa antibiotics, elemental sulfur, and pantoprazole sodium.  MEDICATIONS:  Current Outpatient Medications  Medication Sig Dispense Refill   albuterol (VENTOLIN HFA) 108 (90 Base) MCG/ACT inhaler Inhale 2 puffs into the lungs every 4 (four) hours as needed (as needed).     allopurinol  (ZYLOPRIM ) 300 MG tablet Take 150 mg by mouth daily. (Patient taking differently: Take 150 mg by mouth as needed.)     amLODipine  (NORVASC ) 5 MG tablet Take 5 mg by mouth daily.      aspirin  81 MG  tablet Take 81 mg by mouth every other day.     carvedilol  (COREG ) 12.5 MG tablet Take 1 tablet (12.5 mg total) by mouth 2 (two) times daily with a meal. 180 tablet 3   chlorthalidone (HYGROTON) 25 MG tablet Take 12.5 mg by mouth daily.     Cholecalciferol (VITAMIN D ) 1000 UNITS capsule Take 1,000 Units by mouth daily.     ciprofloxacin (CIPRO) 250 MG tablet Take 1 tablet (250 mg total) by mouth every 12 (twelve) hours for 7 days. 14 tablet 0   Coenzyme Q10 (CO Q 10 PO) Take 1 tablet by mouth daily.  (Patient taking differently: Take 1 tablet by mouth daily. Takes once per week due to forgetfulness)     EPIPEN 2-PAK 0.3 MG/0.3ML DEVI 0.3 mg.      glucose blood (ONETOUCH ULTRA) test strip 1 each by Other route as needed. (Patient not taking: Reported on 01/25/2024)     HYDROcodone -acetaminophen  (NORCO/VICODIN) 5-325 MG tablet Take 1-2 tablets by mouth every 6 (six) hours as needed for moderate pain. 10 tablet 0   levothyroxine (SYNTHROID) 50 MCG tablet Take 25 mcg by mouth daily.     lidocaine  (LIDODERM ) 5 % Place 1 patch onto the skin daily. Remove & Discard patch within 12 hours or as directed by MD 10 patch 0   megestrol (MEGACE ES) 625 MG/5ML suspension Take 5 mLs (  625 mg total) by mouth daily. 150 mL 0   olmesartan  (BENICAR ) 20 MG tablet Take 20 mg by mouth daily.     rosuvastatin  (CRESTOR ) 10 MG tablet Take 10 mg by mouth 3 (three) times a week.      No current facility-administered medications for this visit.    PHYSICAL EXAMINATION:  Not performed today as it was a phone only visit  LABORATORY DATA:   I have reviewed the data as listed.  Recent Results (from the past 2160 hours)  Cytology - Non PAP;     Status: None   Collection Time: 01/17/24 10:27 AM  Result Value Ref Range   CYTOLOGY - NON GYN      CYTOLOGY - NON PAP **** THIS IS AN ADDENDUM REPORT **** CASE: [id number] PATIENT: Floretta Brobeck Non-Gynecological Cytology Report  **********Addendum ********** Reason for  Addendum #1: Immunohistochemistry results   Clinical History: Right sided pleural effusion that is partially loculated with multiple pulmonary nodules; Breast ca 2010 Specimen Submitted:  A. PLEURAL FLUID, RIGHT, THORACENTESIS:   FINAL MICROSCOPIC DIAGNOSIS: - Non-small cell carcinoma - See comment  SPECIMEN ADEQUACY: Satisfactory for evaluation  DIAGNOSTIC COMMENTS: Immunohistochemistry will be performed and reported as an addendum.  Dr. Reed agrees.  GROSS: Received is/are 1000cc's of bloody red fluid with tissue.(GW:gw) Smears: 0 Concentration Method (Thin Prep): 1 Cell Block: 1 Conventional Additional Studies: Also received are 2 hematology slides labeled [id number].     Final Diagnosis performed by Norleen Dover, MD.   Electronically signed 01/18/2024 Technical and /  or Professional components performed at [facility]. [facility], [address], [place], [place] [address].  Immunohistochemistry Technical component (if applicable) was performed at [facility]. [address], [place], [place] [address].   IMMUNOHISTOCHEMISTRY DISCLAIMER (if applicable): Some of these immunohistochemical stains may have been developed and the performance characteristics determine by [facility]. Some may not have been cleared or approved by the U.S. Food and Drug Administration. The FDA has determined that such clearance or approval is not necessary. This test is used for clinical purposes. It should not be regarded as investigational or for research. This laboratory is certified under the Clinical Laboratory Improvement Amendments of 1988 (CLIA-88) as qualified to perform high complexity clinical laboratory testing.  The controls stained appropriately.   IHC stains are performed on formalin fixed, paraffi n embedded tissue using a 3,3diaminobenzidine (DAB) chromogen and Leica Bond Autostainer System. The staining intensity of  the nucleus is score manually and is reported as the percentage of tumor cell nuclei demonstrating specific nuclear staining. The specimens are fixed in 10% Neutral Formalin for at least 6 hours and up to 72hrs. These tests are validated on decalcified tissue. Results should be interpreted with caution given the possibility of false negative results on decalcified specimens. Antibody Clones are as follows ER-clone 25F, PR-clone 16, Ki67- clone MM1. Some of these immunohistochemical stains may have been developed and the performance characteristics determined by [facility].      ADDENDUM: Immunohistochemistry shows the malignant cells are positive with Napsin A, TTF-1 and cytokeratin 7.  The cells are negative with cytokeratin 20, CDX2, estrogen receptor, GATA3 and cytokeratin 5/6.  The immunophenotype is most consistent with lung adeno carcinoma.      Addendum #1 performed by Norleen Dover, MD.   Electronically signed 01/19/2024 Technical and / or Professional components performed at [facility]. [facility], [address], [place], [place] [address].  Immunohistochemistry Technical component (  if applicable) was performed at [facility]. [address], [place], [place] [address].   IMMUNOHISTOCHEMISTRY DISCLAIMER (if applicable): Some of these immunohistochemical stains may have been developed and the performance characteristics determine by [facility]. Some may not have been cleared or approved by the U.S. Food and Drug Administration. The FDA has determined that such clearance or approval is not necessary. This test is used for clinical purposes. It should not be regarded as investigational or for research. This laboratory is certified under the Clinical Laboratory Improvement Amendments of 1988 (CLIA-88) as qualified to perform high complexity cli nical laboratory testing.  The controls stained appropriately.   IHC  stains are performed on formalin fixed, paraffin embedded tissue using a 3,3diaminobenzidine (DAB) chromogen and Leica Bond Autostainer System. The staining intensity of the nucleus is score manually and is reported as the percentage of tumor cell nuclei demonstrating specific nuclear staining. The specimens are fixed in 10% Neutral Formalin for at least 6 hours and up to 72hrs. These tests are validated on decalcified tissue. Results should be interpreted with caution given the possibility of false negative results on decalcified specimens. Antibody Clones are as follows ER-clone 62F, PR-clone 16, Ki67- clone MM1. Some of these immunohistochemical stains may have been developed and the performance characteristics determined by [facility].   Lactate dehydrogenase (pleural or peritoneal fluid)     Status: Abnormal   Collection Time: 01/17/24 10:27 AM  Result Value Ref Range   LD, Fluid 174 (H) 3 - 23 U/L    Comment: (NOTE) Results should be evaluated in conjunction with serum values    Fluid Type-FLDH Lung, Right CYTO     Comment: Performed at [facility], [address], [place], [place] [address] CORRECTED ON 10/13 AT 1059: PREVIOUSLY REPORTED AS Lung, Right   Triglycerides, Body Fluid     Status: None   Collection Time: 01/17/24 10:27 AM  Result Value Ref Range   Triglycerides, Fluid 24 Not Estab. mg/dL    Comment: (NOTE) The reference interval(s) and other method performance specifications have not been established for this body fluid. The test result must be integrated into the clinical context for interpretation. Performed At: [facility] [address] [place], [place] [id number] Jennette Shorter MD Ey:[phone]    Fluid Type-FTRIG Lung, Right     Comment: Performed at [facility], [address] [place], [place] [address]  Body fluid cell count with differential     Status: Abnormal   Collection Time: 01/17/24 10:27 AM  Result Value Ref  Range   Fluid Type-FCT Lung, Right CYTO     Comment: CORRECTED ON 10/13 AT 1059: PREVIOUSLY REPORTED AS Lung, Right   Color, Fluid RED (A) YELLOW   Appearance, Fluid TURBID (A) CLEAR   Total Nucleated Cell Count, Fluid 1,155 (H) 0 - 1,000 cu mm   Neutrophil Count, Fluid 12 0 - 25 %   Lymphs, Fluid 70 %   Monocyte-Macrophage-Serous Fluid 18 (L) 50 - 90 %   Eos, Fluid 0 %   Other Cells, Fluid OTHER CELLS IDENTIFIED AS MESOTHELIAL CELLS %    Comment: Performed at [facility], [address] [place], [place] [address]  Cholesterol, body fluid     Status: None   Collection Time: 01/17/24 10:27 AM  Result Value Ref Range   Cholesterol, Fluid 90 mg/dL    Comment: (NOTE) INTERPRETIVE INFORMATION: Cholesterol, Body Fluid For information on body fluid reference ranges  and/or interpretive guidance visit [email or web address] This test was developed and its performance characteristics determined by [facility]. It has not been cleared or approved by the US  Food and Drug Administration. This test was performed in a CLIA certified laboratory and is intended for clinical purposes. Performed At: [facility] [address] [place], [place] [id number] Shanna Dorn SAUNDERS MDPhD Ey:[phone]    Chol, Fluid Type Lung, RightCYTO     Comment: Performed at [facility], [address], [place], [place] [address] CORRECTED ON 10/13 AT 1059: PREVIOUSLY REPORTED AS Lung, Right   Albumin, pleural or peritoneal fluid      Status: None   Collection Time: 01/17/24 10:27 AM  Result Value Ref Range   Albumin, Fluid 2.0 g/dL    Comment: (NOTE) No normal range established for this test Results should be evaluated in conjunction with serum values    Fluid Type-FALB Lung, Right CYTO     Comment: Performed at [facility], [address], [place], [place] [address] CORRECTED ON 10/13 AT 1059: PREVIOUSLY REPORTED AS Lung, Right   Protein, pleural or peritoneal  fluid     Status: None   Collection Time: 01/17/24 10:27 AM  Result Value Ref Range   Total protein, fluid 4.3 g/dL    Comment: (NOTE) No normal range established for this test Results should be evaluated in conjunction with serum values    Fluid Type-FTP Lung, Right CYTO     Comment: Performed at [facility], [address], [place], [place] [address] CORRECTED ON 10/13 AT 1059: PREVIOUSLY REPORTED AS Lung, Right   Glucose, pleural or peritoneal fluid     Status: None   Collection Time: 01/17/24 10:27 AM  Result Value Ref Range   Glucose, Fluid 152 mg/dL    Comment: (NOTE) No normal range established for this test Results should be evaluated in conjunction with serum values    Fluid Type-FGLU Lung, Right CYTO     Comment: Performed at [facility], [address] [place], [place] [address] CORRECTED ON 10/13 AT 1059: PREVIOUSLY REPORTED AS Lung, Right   Body fluid culture w Gram Stain     Status: None   Collection Time: 01/17/24 10:27 AM   Specimen: Lung, Right; Pleural Fluid  Result Value Ref Range   Specimen Description PLEURAL    Special Requests LUNG,RIGHT    Gram Stain      ABUNDANT WBC PRESENT, PREDOMINANTLY MONONUCLEAR NO ORGANISMS SEEN    Culture      NO GROWTH 3 DAYS Performed at [facility], [address], [place], [place] [address]    Report Status 01/20/2024 FINAL   Lactate dehydrogenase     Status: Abnormal   Collection Time: 01/25/24  2:18 PM  Result Value Ref Range   LDH 301 (H) 98 - 192 U/L    Comment: Performed at [facility], [address], [place], [place] [address]  CMP (Cancer Center only)     Status: Abnormal   Collection Time: 01/25/24  2:18 PM  Result Value Ref Range   Sodium 138 135 - 145 mmol/L   Potassium 4.4 3.5 - 5.1 mmol/L   Chloride 97 (L) 98 - 111 mmol/L   CO2 31 22 - 32 mmol/L   Glucose, Bld 122 (H) 70 - 99 mg/dL    Comment: Glucose reference range applies only to samples taken after  fasting for at least 8 hours.  BUN 39 (H) 8 - 23 mg/dL   Creatinine 8.69 (H) 9.55 - 1.00 mg/dL   Calcium  9.7 8.9 - 10.3 mg/dL   Total Protein 7.5 6.5 - 8.1 g/dL   Albumin 3.2 (L) 3.5 - 5.0 g/dL   AST 28 15 - 41 U/L   ALT 31 0 - 44 U/L   Alkaline Phosphatase 129 (H) 38 - 126 U/L   Total Bilirubin 0.3 0.0 - 1.2 mg/dL   GFR, Estimated 40 (L) >60 mL/min    Comment: (NOTE) Calculated using the CKD-EPI Creatinine Equation (2021)    Anion gap 10 5 - 15    Comment: Performed at [facility], [address] [place], [place] [address]  CBC with Differential (Cancer Center Only)     Status: Abnormal   Collection Time: 01/25/24  2:18 PM  Result Value Ref Range   WBC Count 12.0 (H) 4.0 - 10.5 K/uL   RBC 4.47 3.87 - 5.11 MIL/uL   Hemoglobin 11.7 (L) 12.0 - 15.0 g/dL   HCT 63.4 63.9 - 53.9 %   MCV 81.7 80.0 - 100.0 fL   MCH 26.2 26.0 - 34.0 pg   MCHC 32.1 30.0 - 36.0 g/dL   RDW 82.7 (H) 88.4 - 84.4 %   Platelet Count 404 (H) 150 - 400 K/uL   nRBC 0.0 0.0 - 0.2 %   Neutrophils Relative % 81 %   Neutro Abs 9.8 (H) 1.7 - 7.7 K/uL   Lymphocytes Relative 10 %   Lymphs Abs 1.1 0.7 - 4.0 K/uL   Monocytes Relative 7 %   Monocytes Absolute 0.9 0.1 - 1.0 K/uL   Eosinophils Relative 1 %   Eosinophils Absolute 0.1 0.0 - 0.5 K/uL   Basophils Relative 0 %   Basophils Absolute 0.0 0.0 - 0.1 K/uL   Immature Granulocytes 1 %   Abs Immature Granulocytes 0.08 (H) 0.00 - 0.07 K/uL    Comment: Performed at [facility], [address] [place], [place] [address]  Glucose, capillary     Status: Abnormal   Collection Time: 02/04/24  8:50 AM  Result Value Ref Range   Glucose-Capillary 107 (H) 70 - 99 mg/dL    Comment: Glucose reference range applies only to samples taken after fasting for at least 8 hours.  CBC with Differential     Status: Abnormal   Collection Time: 02/06/24  3:04 PM  Result Value Ref Range   WBC 16.0 (H) 4.0 - 10.5 K/uL   RBC 3.99 3.87 - 5.11  MIL/uL   Hemoglobin 10.4 (L) 12.0 - 15.0 g/dL   HCT 66.1 (L) 63.9 - 53.9 %   MCV 84.7 80.0 - 100.0 fL   MCH 26.1 26.0 - 34.0 pg   MCHC 30.8 30.0 - 36.0 g/dL   RDW 81.7 (H) 88.4 - 84.4 %   Platelets 348 150 - 400 K/uL   nRBC 0.0 0.0 - 0.2 %   Neutrophils Relative % 82 %   Neutro Abs 13.3 (H) 1.7 - 7.7 K/uL   Lymphocytes Relative 8 %   Lymphs Abs 1.2 0.7 - 4.0 K/uL   Monocytes Relative 8 %   Monocytes Absolute 1.2 (H) 0.1 - 1.0 K/uL   Eosinophils Relative 1 %   Eosinophils Absolute 0.1 0.0 - 0.5 K/uL   Basophils Relative 0 %   Basophils Absolute 0.1 0.0 - 0.1 K/uL   Immature Granulocytes 1 %   Abs Immature Granulocytes 0.10 (H) 0.00 - 0.07 K/uL  Comment: Performed at [facility], [address] [place], [place] [address]  Comprehensive metabolic panel     Status: Abnormal   Collection Time: 02/06/24  3:04 PM  Result Value Ref Range   Sodium 137 135 - 145 mmol/L   Potassium 4.7 3.5 - 5.1 mmol/L   Chloride 104 98 - 111 mmol/L   CO2 23 22 - 32 mmol/L   Glucose, Bld 106 (H) 70 - 99 mg/dL    Comment: Glucose reference range applies only to samples taken after fasting for at least 8 hours.   BUN 33 (H) 8 - 23 mg/dL   Creatinine, Ser 8.38 (H) 0.44 - 1.00 mg/dL   Calcium  8.9 8.9 - 10.3 mg/dL   Total Protein 6.8 6.5 - 8.1 g/dL   Albumin 3.0 (L) 3.5 - 5.0 g/dL   AST 24 15 - 41 U/L    Comment: HEMOLYSIS AT THIS LEVEL MAY AFFECT RESULT   ALT 20 0 - 44 U/L   Alkaline Phosphatase 85 38 - 126 U/L   Total Bilirubin 0.2 0.0 - 1.2 mg/dL   GFR, Estimated 31 (L) >60 mL/min    Comment: (NOTE) Calculated using the CKD-EPI Creatinine Equation (2021)    Anion gap 10 5 - 15    Comment: Performed at [facility], [address] [place], [place] [address]     RADIOGRAPHIC STUDIES:  I have personally reviewed the radiological images as listed and agree with the findings in the report.  MR Brain Wo Contrast Result Date: 02/06/2024 EXAM: MRI BRAIN  WITHOUT CONTRAST 02/04/2024 08:32:48 AM TECHNIQUE: Multiplanar multisequence MRI of the head/brain was performed without the administration of intravenous contrast. COMPARISON: MR Head 03/03/2023. CLINICAL HISTORY: 89 year old female. Newly diagnosed stage 4 lung adenocarcinoma. Needs staging. FINDINGS: BRAIN AND VENTRICLES: No acute infarct. No intracranial hemorrhage. No mass. No midline shift. No hydrocephalus. Cerebral volume and major vascular flow voids are not significantly changed from last year. No cerebral edema. Elnor and white matter signal appears stable from last year and largely normal per age. No chronic cerebral blood products on SWI. The sella is unremarkable. Accidentally, coronal T2 imaging was not performed. Normal flow voids. ORBITS: No acute abnormality. SINUSES AND MASTOIDS: No acute abnormality. BONES AND SOFT TISSUES: Background bone marrow signal is stable. No acute soft tissue abnormality. Advanced chronic cervical spine degeneration redemonstrated visible spinal stenosis chronically most pronounced at C3-C4. IMPRESSION: 1. No strong evidence of metastatic disease or acute intracranial abnormality on this NON-contrast exam. Electronically signed by: Helayne Hurst MD 02/06/2024 02:20 PM EST RP Workstation: [id number]   DG Chest 2 View Result Date: 02/06/2024 EXAM: 2 VIEW(S) XRAY OF THE CHEST 02/06/2024 01:55:59 PM COMPARISON: 01/17/2024 and earlier. CLINICAL HISTORY: 89 year old female with recently diagnosed lung cancer, status post ultrasound guided thoracentesis last month. FINDINGS: LUNGS AND PLEURA: Other abnormal right lower lung opacity redemonstrated. No significant progression of residual right pleural effusion since 01/17/2024. Trace fluid now in the right minor fissure. Small left pleural effusion now difficult to exclude. No pulmonary edema. No pneumothorax. HEART AND MEDIASTINUM: No acute abnormality of the cardiac and mediastinal silhouettes. Stable mediastinal contours.  BONES AND SOFT TISSUES: No acute osseous abnormality. IMPRESSION: 1. No significant progression of residual right pleural effusion since 01/17/2024. Other stable abnormal right lower lung opacity. 2. Small left pleural effusion now difficult to exclude. No other new cardiopulmonary abnormality. Electronically signed by: Helayne Hurst MD 02/06/2024 02:16 PM EST RP Workstation: [id number]   CT  Cervical Spine Wo Contrast Result Date: 02/06/2024 EXAM: CT CERVICAL SPINE WITHOUT CONTRAST 02/06/2024 02:01:12 PM TECHNIQUE: CT of the cervical spine was performed without the administration of intravenous contrast. Multiplanar reformatted images are provided for review. Automated exposure control, iterative reconstruction, and/or weight based adjustment of the mA/kV was utilized to reduce the radiation dose to as low as reasonably achievable. COMPARISON: MRI 09/06/2018. CLINICAL HISTORY: 89 year old female. Recently diagnosed lung cancer. Fall. FINDINGS: CERVICAL SPINE: BONES AND ALIGNMENT: No acute fracture or traumatic malalignment. Similar straightening of cervical lordosis. Normal underlying bone mineralization for age. DEGENERATIVE CHANGES: Advanced chronic cervical spine disc and endplate degeneration throughout, bulky at C3-C4. Chronic cervical spinal stenosis at C3-C4. SOFT TISSUES: No prevertebral soft tissue swelling. Calcified cervical carotid atherosclerosis. Partially retropharyngeal course of the carotids also, normal variant. Motion artifact at the larynx. IMPRESSION: 1. No acute traumatic injury identified in the cervical spine. 2. Advanced chronic cervical degeneration, chronic spinal cord mass effect at C3-C4. Electronically signed by: Helayne Hurst MD 02/06/2024 02:14 PM EST RP Workstation: [id number]   DG Knee Complete 4 Views Right Result Date: 02/06/2024 EXAM: 4 OR MORE VIEW(S) XRAY OF THE KNEE 02/06/2024 01:55:59 PM COMPARISON: None available. CLINICAL HISTORY: knee pain, unwitnessed fall. FINDINGS:  BONES AND JOINTS: No acute fracture. No focal osseous lesion. No joint dislocation. Small joint effusion. 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 SOFT TISSUES: Anterior soft tissue swelling. IMPRESSION: 1. No acute fracture or dislocation identified. 2. Small joint effusion.  Anterior soft tissue swelling. 3. Severe degeneration. Electronically signed by: Helayne Hurst MD 02/06/2024 02:09 PM EST RP Workstation: [id number]   CT Head Wo Contrast Result Date: 02/06/2024 EXAM: CT HEAD WITHOUT CONTRAST 02/06/2024 02:01:12 PM TECHNIQUE: CT of the head was performed without the administration of intravenous contrast. Automated exposure control, iterative reconstruction, and/or weight based adjustment of the mA/kV was utilized to reduce the radiation dose to as low as reasonably achievable. COMPARISON: Brain MRI 03/03/2023 CLINICAL HISTORY: 89 year old female. Recently diagnosed lung cancer. Fall. FINDINGS: BRAIN AND VENTRICLES: No acute hemorrhage. No evidence of acute infarct. No hydrocephalus. No extra-axial collection. No mass effect or midline shift. Scattered bulky dural calcification incidentally noted. Normal brain volume for age. Mild for age periventricular white matter hypodensity. Calcified atherosclerosis at the skull base. No suspicious intracranial vascular hyperdensity. ORBITS: No acute abnormality. SINUSES: Previous right mastoidectomy is aerated. Other paranasal sinuses, middle ears and mastoids also normally aerated. SOFT TISSUES AND SKULL: Anterior scalp soft tissue injury with combined hematoma / contusion, and soft tissue gas suggesting laceration. Underlying frontal bones appear intact. Posterior soft tissues appear negative. No skull fracture. IMPRESSION: 1. Anterior Scalp soft tissue injury. No skull fracture identified. 2. Negative for age non-contrast head CT appearance of the brain. Electronically signed by: Helayne Hurst MD 02/06/2024 02:08 PM  EST RP Workstation: [id number]   NM PET Image Initial (PI) Skull Base To Thigh Result Date: 02/04/2024 EXAM: PET AND CT SKULL BASE TO MID THIGH 02/04/2024 10:24:12 AM TECHNIQUE: RADIOPHARMACEUTICAL: 11.41 mCi F-18 FDG Uptake time 60 minutes. Glucose level 107 mg/dl. A portion of the FDG dose infiltrated into the left hand. Enough activity in circulation for adequate interpretation. PET imaging was acquired from the base of the skull to the mid thighs. Non-contrast enhanced computed tomography was obtained for attenuation correction and anatomic localization. COMPARISON: CT chest 12/17/2023 CLINICAL HISTORY: Newly diagnosed lung adenocarcinoma with malignant pleural effusion. Needs staging. FINDINGS: HEAD AND NECK: Intense activity within the posterior aspect  of the left lobe of the thyroid gland on imaging 41. No metabolically active cervical lymphadenopathy. CHEST: Large right pleural effusion. Along the pleural surface within the large right pleural effusion, there is hypermetabolic pleural thickening. For example, thickening measuring 15 mm on image 81 has intense metabolic activity with SUV max equal to 12.0. Hypermetabolic pleural thickening more superiorly in the right lower lobe on image 59 with SUV max equal to 8.2. Centrally within the right lung along the fissure of the upper lobe, a 2.8 cm nodule on image 59 is hypermetabolic with SUV max equal to 6.4. Small hypermetabolic right paratracheal node on image 48. ABDOMEN AND PELVIS: Adrenal glands are normal. No hepatic metastases. Bilateral large benign simple fluid renal cysts. No metabolically active intraperitoneal mass. No metabolically active lymphadenopathy. Physiologic activity within the gastrointestinal and genitourinary systems. BONES AND SOFT TISSUE: No evidence of skeletal metastasis. IMPRESSION: 1. Hypermetabolic 2.8 cm right upper lobe pulmonary nodule, suspicious for malignancy. 2. Large right pleural effusion with hypermetabolic pleural  thickening, suspicious for malignant pleural involvement. 3. Hypermetabolic right paratracheal lymph node, suspicious for nodal metastasis. 4. Focal intense uptake in the left thyroid lobe; recommend non-emergent thyroid ultrasound and biopsy if a discrete nodule is identified. Electronically signed by: Norleen Boxer MD 02/04/2024 11:00 AM EDT RP Workstation: [id number]   IR THORACENTESIS ASP PLEURAL SPACE W/IMG GUIDE Result Date: 01/17/2024 INDICATION: 89 year old female with recent pneumonia, in new onset right pleural effusion. IR was requested for diagnostic and therapeutic thoracentesis. EXAM: ULTRASOUND GUIDED DIAGNOSTIC AND THERAPEUTIC RIGHT-SIDED THORACENTESIS MEDICATIONS: 4 cc of 1% lidocaine . COMPLICATIONS: None immediate. PROCEDURE: An ultrasound guided thoracentesis was thoroughly discussed with the patient and questions answered. The benefits, risks, alternatives and complications were also discussed. The patient understands and wishes to proceed with the procedure. Written consent was obtained. Ultrasound was performed to localize and mark an adequate pocket of fluid in the right chest. The area was then prepped and draped in the normal sterile fashion. 1% Lidocaine  was used for local anesthesia. Under ultrasound guidance a 6 Fr Safe-T-Centesis catheter was introduced. Thoracentesis was performed. The catheter was removed and a dressing applied. FINDINGS: A total of approximately 1.5 L of clear, blood-laden pleural fluid was removed. Samples were sent to the laboratory as requested by the clinical team. IMPRESSION: Successful ultrasound guided right thoracentesis yielding 1.5 L of pleural fluid. Procedure performed by Carlin Griffon, PA-C Electronically Signed   By: Juliene Balder M.D.   On: 01/17/2024 10:34   DG Chest 1 View Result Date: 01/17/2024 CLINICAL DATA:  89 year old female status post right thoracentesis. EXAM: CHEST  1 VIEW COMPARISON:  Prior CT scan of the chest 12/17/2023 FINDINGS:  Stable cardiac and mediastinal contours. Very low inspiratory volumes with bibasilar atelectasis. No evidence of pneumothorax. Probable residual right lower lobe atelectasis and perhaps small effusion. IMPRESSION: No evidence of pneumothorax or other complication following right-sided thoracentesis. Electronically Signed   By: Wilkie Lent M.D.   On: 01/17/2024 10:14    No orders of the defined types were placed in this encounter.    Future Appointments  Date Time Provider Department Center  02/28/2024  2:15 PM [facility] PHLEBOTOMIST [facility] None  02/28/2024  2:45 PM Abrar Koone, MD [facility] None  03/10/2024  3:00 PM [facility] 1 [facility] [facility]  11/13/2024  3:30 PM Dohmeier, Dedra, MD [facility] None    This document was completed utilizing speech recognition software. Grammatical errors, random word insertions, pronoun errors, and incomplete sentences are an occasional consequence  of this system due to software limitations, ambient noise, and hardware issues. Any formal questions or concerns about the content, text or information contained within the body of this dictation should be directly addressed to the provider for clarification.

## 2024-02-09 ENCOUNTER — Encounter: Payer: Self-pay | Admitting: Oncology

## 2024-02-09 NOTE — Assessment & Plan Note (Signed)
 Please review oncology history for additional details and timeline of events.  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 Lung nodules present bilaterally, largest 9 mm.   Cancer is advanced and not curable, but treatable. Treatment options depend on mutation testing results.   On her consultation with us  on 01/25/2024, we submitted request for staging PET scan and MRI of the brain.  Also submitted NGS panel testing on the specimen and also liquid biopsy to look for any actionable mutations. Liquid biopsy showed KRAS G12A and PIK3CA E545K mutations noted.  Non-actionable and lung adenocarcinoma.  Tissue testing results pending.  On 02/04/2024, MRI of the brain showed no intracranial metastatic disease.  PET scan showed hypermetabolic 2.8 cm right upper lobe pulmonary nodule, suspicious for malignancy. Large right pleural effusion with hypermetabolic pleural thickening, suspicious for malignant pleural involvement. Hypermetabolic right paratracheal lymph node, suspicious for nodal metastasis. Focal intense uptake in the left thyroid lobe; recommend non-emergent thyroid ultrasound and biopsy if a discrete nodule is identified.  Goal is to manage symptoms, prevent fluid buildup, and control disease progression. Without treatment, life expectancy is less than six months. With treatment, survival may extend by at least one year, depending on mutation profile.  Her functional status precludes standard dose chemotherapy.  If there are any actionable mutations, we will treat her with targeted therapy versus immunotherapy.  If no actionable mutations, we will consider dose reduced chemotherapy along with immunotherapy.

## 2024-02-10 ENCOUNTER — Inpatient Hospital Stay: Admitting: Dietician

## 2024-02-13 ENCOUNTER — Encounter: Payer: Self-pay | Admitting: Oncology

## 2024-02-14 ENCOUNTER — Telehealth: Payer: Self-pay | Admitting: Surgery

## 2024-02-14 NOTE — Telephone Encounter (Signed)
 Called patient back regarding mychart message. Advised patient that guardant 360 results are only partially back, we are still awaiting tissue testing results. Also checked on patient's leg swelling. Patient's daughter stated that swelling started over a week ago and is bilateral below the knees. Denies redness, warmth, or pain. Denies SOB, chest pain, or mental status changes. Daughter states pt had a different doctor's appointment today and was advised to try compression socks and elevating her legs. Reinforced this and also advised that if there are sudden changes to health status such as chest pain, SOB, mental status changes, that she should seek immediate medical attention. No other concerns at this time.

## 2024-02-17 ENCOUNTER — Telehealth: Payer: Self-pay | Admitting: Dietician

## 2024-02-17 NOTE — Telephone Encounter (Signed)
 Patient's daughter returned message and gave a brief history of weight loss but was at work and said she would try to call back later.  Steadily losing weight (50# past 5 months) since June when breathing issues started and diagnosed with PNA. When she was treated with Abx, lost appetite.  Daughter reports nausea and abdominal cramping now past couple of weeks which is new.  Patient is using the megace and appetite picked up. Allergy to shellfish, can eat fin fish but isn't usual for her to take much seafood overall. Yesterday was a good day for eating.  Breakfast: Biscuit Peanut butter sandwich for snack Beef stew with potatoes and carrots asked for seconds.  I wasn't able to offer interventions as daughter had to return to work and end call.  Micheline Craven, RDN, LDN Registered Dietitian, [facility] Part Time Remote (Usual office hours: Tuesday-Thursday) Mobile: [phone] Remote Office: [phone]

## 2024-02-17 NOTE — Telephone Encounter (Signed)
 Daughter returned call.  We talked briefly about strategies to address nausea.    Daughter said we could all talk together when she gets home from work on Tuesday.  She will call me when she's with patient.  Micheline Craven, RDN, LDN Registered Dietitian, [facility] Part Time Remote (Usual office hours: Tuesday-Thursday) Mobile: [phone]

## 2024-02-17 NOTE — Telephone Encounter (Signed)
 Patient screened on MST. First attempt to reach. Provided my cell# on voice mail to return call to set up a nutrition consult.  Micheline Craven, RDN, LDN Registered Dietitian, [facility] Part Time Remote (Usual office hours: Tuesday-Thursday) Cell: [phone]

## 2024-02-21 ENCOUNTER — Encounter (HOSPITAL_COMMUNITY): Payer: Self-pay

## 2024-02-22 ENCOUNTER — Inpatient Hospital Stay: Admitting: Dietician

## 2024-02-22 ENCOUNTER — Telehealth: Payer: Self-pay | Admitting: Dietician

## 2024-02-22 NOTE — Telephone Encounter (Signed)
 Daughter called to reschedule today's appointment.  She isn't able to get to her house this afternoon.  Daughter is off from work next Wednesday.  She also reported appetite is improved a bit with new medication.  Micheline Craven, RDN, LDN Registered Dietitian, [facility] Part Time Remote (Usual office hours: Tuesday-Thursday) Mobile: [phone] Remote Office: [phone]

## 2024-02-23 ENCOUNTER — Inpatient Hospital Stay: Admitting: Dietician

## 2024-02-23 ENCOUNTER — Encounter: Payer: Self-pay | Admitting: Oncology

## 2024-02-25 ENCOUNTER — Other Ambulatory Visit: Payer: Self-pay | Admitting: Oncology

## 2024-02-25 DIAGNOSIS — C3490 Malignant neoplasm of unspecified part of unspecified bronchus or lung: Secondary | ICD-10-CM

## 2024-02-28 ENCOUNTER — Inpatient Hospital Stay

## 2024-02-28 ENCOUNTER — Inpatient Hospital Stay (HOSPITAL_BASED_OUTPATIENT_CLINIC_OR_DEPARTMENT_OTHER): Admitting: Oncology

## 2024-02-28 ENCOUNTER — Encounter: Payer: Self-pay | Admitting: Oncology

## 2024-02-28 VITALS — BP 101/53 | HR 73 | Temp 98.0°F | Resp 16

## 2024-02-28 DIAGNOSIS — C3490 Malignant neoplasm of unspecified part of unspecified bronchus or lung: Secondary | ICD-10-CM

## 2024-02-28 DIAGNOSIS — I130 Hypertensive heart and chronic kidney disease with heart failure and stage 1 through stage 4 chronic kidney disease, or unspecified chronic kidney disease: Secondary | ICD-10-CM | POA: Diagnosis not present

## 2024-02-28 DIAGNOSIS — N183 Chronic kidney disease, stage 3 unspecified: Secondary | ICD-10-CM | POA: Diagnosis not present

## 2024-02-28 DIAGNOSIS — R530 Neoplastic (malignant) related fatigue: Secondary | ICD-10-CM | POA: Diagnosis not present

## 2024-02-28 DIAGNOSIS — I509 Heart failure, unspecified: Secondary | ICD-10-CM | POA: Diagnosis not present

## 2024-02-28 DIAGNOSIS — C3411 Malignant neoplasm of upper lobe, right bronchus or lung: Secondary | ICD-10-CM | POA: Diagnosis present

## 2024-02-28 DIAGNOSIS — J910 Malignant pleural effusion: Secondary | ICD-10-CM | POA: Diagnosis not present

## 2024-02-28 LAB — CMP (CANCER CENTER ONLY)
ALT: 27 U/L (ref 0–44)
AST: 16 U/L (ref 15–41)
Albumin: 3.1 g/dL — ABNORMAL LOW (ref 3.5–5.0)
Alkaline Phosphatase: 66 U/L (ref 38–126)
Anion gap: 8 (ref 5–15)
BUN: 22 mg/dL (ref 8–23)
CO2: 26 mmol/L (ref 22–32)
Calcium: 9.6 mg/dL (ref 8.9–10.3)
Chloride: 100 mmol/L (ref 98–111)
Creatinine: 1.57 mg/dL — ABNORMAL HIGH (ref 0.44–1.00)
GFR, Estimated: 32 mL/min — ABNORMAL LOW (ref >60)
Glucose, Bld: 136 mg/dL — ABNORMAL HIGH (ref 70–99)
Potassium: 5 mmol/L (ref 3.5–5.1)
Sodium: 134 mmol/L — ABNORMAL LOW (ref 135–145)
Total Bilirubin: 0.2 mg/dL (ref 0.0–1.2)
Total Protein: 7 g/dL (ref 6.5–8.1)

## 2024-02-28 LAB — CBC WITH DIFFERENTIAL (CANCER CENTER ONLY)
Abs Immature Granulocytes: 0.08 K/uL — ABNORMAL HIGH (ref 0.00–0.07)
Basophils Absolute: 0 K/uL (ref 0.0–0.1)
Basophils Relative: 0 %
Eosinophils Absolute: 0.1 K/uL (ref 0.0–0.5)
Eosinophils Relative: 1 %
HCT: 32.6 % — ABNORMAL LOW (ref 36.0–46.0)
Hemoglobin: 10.5 g/dL — ABNORMAL LOW (ref 12.0–15.0)
Immature Granulocytes: 1 %
Lymphocytes Relative: 10 %
Lymphs Abs: 0.9 K/uL (ref 0.7–4.0)
MCH: 26.9 pg (ref 26.0–34.0)
MCHC: 32.2 g/dL (ref 30.0–36.0)
MCV: 83.4 fL (ref 80.0–100.0)
Monocytes Absolute: 0.7 K/uL (ref 0.1–1.0)
Monocytes Relative: 8 %
Neutro Abs: 7.3 K/uL (ref 1.7–7.7)
Neutrophils Relative %: 80 %
Platelet Count: 297 K/uL (ref 150–400)
RBC: 3.91 MIL/uL (ref 3.87–5.11)
RDW: 18.7 % — ABNORMAL HIGH (ref 11.5–15.5)
WBC Count: 9.1 K/uL (ref 4.0–10.5)
nRBC: 0 % (ref 0.0–0.2)

## 2024-02-28 LAB — MAGNESIUM: Magnesium: 1.8 mg/dL (ref 1.7–2.4)

## 2024-02-28 LAB — LACTATE DEHYDROGENASE: LDH: 319 U/L — ABNORMAL HIGH (ref 105–235)

## 2024-02-28 MED ORDER — MEGESTROL ACETATE 625 MG/5ML PO SUSP: 625.0000 mg | Freq: Every day | ORAL | 5 refills | Status: AC

## 2024-02-28 NOTE — Assessment & Plan Note (Signed)
 Please review oncology history for additional details and timeline of events.  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 Lung nodules present bilaterally, largest 9 mm.   Cancer is advanced and not curable, but treatable. Treatment options depend on mutation testing results.   On her consultation with us  on 01/25/2024, we submitted request for staging PET scan and MRI of the brain.  Also submitted NGS panel testing on the specimen and also liquid biopsy to look for any actionable mutations. Liquid biopsy showed KRAS G12A and PIK3CA E545K mutations noted.  Non-actionable in lung adenocarcinoma.  Tissue testing results also showed low TMB (1), Microsatellite stable disease.  No evidence of PD-L1 mutation.  NF1 mutation noted, nonactionable.  On 02/04/2024, MRI of the brain showed no intracranial metastatic disease.  PET scan showed hypermetabolic 2.8 cm right upper lobe pulmonary nodule, suspicious for malignancy. Large right pleural effusion with hypermetabolic pleural thickening, suspicious for malignant pleural involvement. Hypermetabolic right paratracheal lymph node, suspicious for nodal metastasis. Focal intense uptake in the left thyroid lobe; recommend non-emergent thyroid ultrasound and biopsy if a discrete nodule is identified.  Her functional status precludes standard dose chemotherapy.  Since she is not a candidate for targeted therapy or immunotherapy by itself, proposed low-dose chemotherapy.  Patient is not inclined to proceed with chemotherapy and we respect her wishes, considering her age and comorbidities.  Referral sent to palliative care for symptom management/Goals of care discussion.

## 2024-02-28 NOTE — Progress Notes (Signed)
 [facility]  ONCOLOGY CLINIC PROGRESS NOTE   Patient Care Team: Shayne Anes, MD as PCP - General (Internal Medicine) Nahser, Aleene PARAS, MD (Inactive) as PCP - Cardiology (Cardiology) Merrilyn Handler, MD (Inactive) as Surgeon (General Surgery) Lomax, Carlin ORN, MD (Inactive) (Obstetrics and Gynecology) Zaida, Zola SAILOR, MD as Consulting Physician (Pulmonary Disease)  PATIENT NAME: Vicki [name]   MR#: [id number] DOB: 02/18/1935  Date of visit: 02/28/2024   ASSESSMENT & PLAN:   Vicki [name] is a 89 y.o.  lady with a past medical history of left sided breast cancer, diagnosed in 2010, treated with lumpectomy followed by adjuvant radiation and adjuvant hormonal therapy with letrozole  for 5 years, in remission, hypertension, diabetes mellitus, CKD stage III, sleep apnea, was referred to our clinic for recent diagnosis of lung adenocarcinoma with malignant pleural effusion.  Stage IV A disease.   Adenocarcinoma of lung [facility]) Please review oncology history for additional details and timeline of events.  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 Lung nodules present bilaterally, largest 9 mm.   Cancer is advanced and not curable, but treatable. Treatment options depend on mutation testing results.   On her consultation with us  on 01/25/2024, we submitted request for staging PET scan and MRI of the brain.  Also submitted NGS panel testing on the specimen and also liquid biopsy to look for any actionable mutations. Liquid biopsy showed KRAS G12A and PIK3CA E545K mutations noted.  Non-actionable in lung adenocarcinoma.  Tissue testing results also showed low TMB (1), Microsatellite stable disease.  No evidence of PD-L1 mutation.  NF1 mutation noted, nonactionable.  On 02/04/2024, MRI of the brain showed no intracranial metastatic disease.  PET scan showed hypermetabolic 2.8 cm right upper lobe pulmonary  nodule, suspicious for malignancy. Large right pleural effusion with hypermetabolic pleural thickening, suspicious for malignant pleural involvement. Hypermetabolic right paratracheal lymph node, suspicious for nodal metastasis. Focal intense uptake in the left thyroid lobe; recommend non-emergent thyroid ultrasound and biopsy if a discrete nodule is identified.  Her functional status precludes standard dose chemotherapy.  Since she is not a candidate for targeted therapy or immunotherapy by itself, proposed low-dose chemotherapy.  Patient is not inclined to proceed with chemotherapy and we respect her wishes, considering her age and comorbidities.  Referral sent to palliative care for symptom management/Goals of care discussion.  If she changes her mind regarding pursuing systemic chemotherapy, she will inform us .  For now we will plan to see her approximately in 6 weeks for follow-up.  Dyspnea and cough secondary to lung cancer and pleural effusion Dyspnea and cough are likely secondary to lung cancer and pleural effusion. She experiences shortness of breath and cough, especially with deep breaths. Recent cold may contribute to symptoms. -She has a CT of the chest scheduled for 03/10/2024 for reevaluation.  Referral sent to IR for thoracentesis evaluation following the CT scan. - Continue albuterol inhaler as needed for dyspnea. - Use Robitussin D as needed for cough.  Cancer-related fatigue and decreased appetite Cancer-related fatigue and decreased appetite are likely due to the cancer's metabolic demands and nutritional depletion. She reports significant weakness and decreased appetite. - Refilled Megace  prescription to stimulate appetite.  Congestive heart failure Congestive heart failure is present and does impact treatment options.   Chronic kidney disease Chronic kidney disease is noted, likely age-related. It will be considered in the context of treatment options for lung cancer.  I  reviewed lab results and outside records for this visit and discussed relevant results with the patient. Diagnosis, plan of care and treatment options were also discussed in detail with the patient. Opportunity provided to ask questions and answers provided to her apparent satisfaction. Provided instructions to call our clinic with any problems, questions or concerns prior to return visit. I recommended to continue follow-up with PCP and sub-specialists. She verbalized understanding and agreed with the plan.   NCCN guidelines have been consulted in the planning of this patient's care.  I spent a total of 42 minutes during this encounter with the patient including review of chart and various tests results, discussions about plan of care and coordination of care plan.   Chinita Patten, MD  02/28/2024 5:14 PM  [facility] - A DEPT OF [name] [address] [place] [address] Dept: [phone] Dept Fax: [phone]    CHIEF COMPLAINT/ REASON FOR VISIT:   Stage IV A lung adenocarcinoma with malignant pleural effusion  Current Treatment: No actionable mutations.  Patient deferred systemic chemotherapy.  INTERVAL HISTORY:    Discussed the use of AI scribe software for clinical note transcription with the patient, who gave verbal consent to proceed.  History of Present Illness Vicki [name] is an 89 year old female with lung cancer who presents with weakness and shortness of breath.  She was accompanied by her daughter and other family members.  She reports feeling significant fatigue and weakness, which impacts her daily activities.  She has a history of lung cancer with a recent genetic test revealing an NF1 mutation, but no actionable mutations for targeted therapy or immunotherapy. Previous tests for ALK, BRAF, EGFR, KRAS, and MET mutations were negative.  She experiences fluid buildup in her lungs,  leading to intermittent shortness of breath. A CT scan is scheduled for December 5th to assess the need for another thoracentesis. She reports being 'very short winded' at times.  She had a cold about three weeks ago, with a persistent cough that occurs with deep breaths. She uses an albuterol inhaler as needed and takes Robitussin D for her cough.  Her current medications include Megace  for appetite stimulation, which she is doing 'pretty good' with, and she is awaiting a refill from her pharmacy.  She uses an albuterol inhaler for respiratory symptoms. Her daughter confirms that she does cough with deep breaths.  She has been having falls lately.  Getting nauseous.  Megace  helped with appetite.  I have reviewed the past medical history, past surgical history, social history and family history with the patient and they are unchanged from previous note.  HISTORY OF PRESENT ILLNESS:   ONCOLOGY HISTORY:   She began experiencing shortness of breath and congestion around May or June 2025, initially thought to be a cold. A physician assistant diagnosed her with pneumonia after a chest x-ray and prescribed antibiotics, prednisone , and an inhaler. Despite treatment, her symptoms did not improve, leading to further evaluation.   Subsequent chest x-rays showed persistent fluid in the lungs, prompting another course of antibiotics and a different inhaler, but her condition remained unchanged.   Eventually, she had a CT of the chest without contrast on 12/17/2023.  Which showed moderate-sized right pleural effusion, partially loculated in the major fissure.  Multiple new lung nodules bilaterally, largest of which was measuring 9 mm in the right upper lobe.   With these findings, she was referred to pulmonologist and was  evaluated by Dr. Zaida.  Patient was evaluated by Dr. Zaida on 01/05/2024 and a referral sent to IR for paracentesis.   On 01/17/2024, she underwent ultrasound-guided thoracentesis with  removal of 1.5 L of clear, bloody laden pleural fluid.  Cytology from the pleural fluid came back positive for non-small cell carcinoma.  Immunostains showed positivity for Napsin A, TTF-1 and cytokeratin 7.  Negative for CK20, CDX2, ER, GATA 3, cytokeratin 5/6.  Immunophenotype most consistent with lung adenocarcinoma.   With these findings, referral was sent to us  for further evaluation.   On her consultation with us  on 01/25/2024, we submitted request for staging PET scan and MRI of the brain.  Also submitted NGS panel testing on the specimen and also liquid biopsy to look for any actionable mutations. Liquid biopsy showed KRAS G12A and PIK3CA E545K mutations noted.  Non-actionable in lung adenocarcinoma.  Tissue testing results also showed low TMB (1), Microsatellite stable disease.  No evidence of PD-L1 mutation.  NF1 mutation noted, nonactionable.   On 02/04/2024, MRI of the brain showed no intracranial metastatic disease.  PET scan showed hypermetabolic 2.8 cm right upper lobe pulmonary nodule, suspicious for malignancy. Large right pleural effusion with hypermetabolic pleural thickening, suspicious for malignant pleural involvement. Hypermetabolic right paratracheal lymph node, suspicious for nodal metastasis. Focal intense uptake in the left thyroid lobe; recommend non-emergent thyroid ultrasound and biopsy if a discrete nodule is identified.   Her functional status precludes standard dose chemotherapy.  Since she is not a candidate for targeted therapy or immunotherapy by itself, proposed low-dose chemotherapy.  Patient is not inclined to proceed with chemotherapy and we respect her wishes, considering her age and comorbidities.  Referral sent to palliative care for symptom management/Goals of care discussion.  Oncology History  Adenocarcinoma of lung (HCC)  01/25/2024 Initial Diagnosis   Adenocarcinoma of lung (HCC)   01/25/2024 Cancer Staging   Staging form: Lung, AJCC V9 - Clinical  stage from 01/25/2024: Stage IVA (cT4, cNX, pM1a) - Signed by Autumn Millman, MD on 01/25/2024 Stage prefix: Initial diagnosis       REVIEW OF SYSTEMS:   Review of Systems - Oncology  All other pertinent systems were reviewed with the patient and are negative.  ALLERGIES: She is allergic to shellfish allergy, sulfa antibiotics, elemental sulfur, and pantoprazole sodium.  MEDICATIONS:  Current Outpatient Medications  Medication Sig Dispense Refill   albuterol (VENTOLIN HFA) 108 (90 Base) MCG/ACT inhaler Inhale 2 puffs into the lungs every 4 (four) hours as needed (as needed).     allopurinol  (ZYLOPRIM ) 300 MG tablet Take 150 mg by mouth daily. (Patient taking differently: Take 150 mg by mouth as needed.)     amLODipine  (NORVASC ) 5 MG tablet Take 5 mg by mouth daily.      aspirin  81 MG tablet Take 81 mg by mouth every other day.     carvedilol  (COREG ) 12.5 MG tablet Take 1 tablet (12.5 mg total) by mouth 2 (two) times daily with a meal. 180 tablet 3   chlorthalidone (HYGROTON) 25 MG tablet Take 12.5 mg by mouth daily.     Cholecalciferol (VITAMIN D ) 1000 UNITS capsule Take 1,000 Units by mouth daily.     Coenzyme Q10 (CO Q 10 PO) Take 1 tablet by mouth daily.  (Patient taking differently: Take 1 tablet by mouth daily. Takes once per week due to forgetfulness)     EPIPEN  2-PAK 0.3 MG/0.3ML DEVI 0.3 mg.      glucose blood (ONETOUCH ULTRA) test strip  1 each by Other route as needed.     HYDROcodone -acetaminophen  (NORCO/VICODIN) 5-325 MG tablet Take 1-2 tablets by mouth every 6 (six) hours as needed for moderate pain. 10 tablet 0   levothyroxine (SYNTHROID) 50 MCG tablet Take 25 mcg by mouth daily.     lidocaine  (LIDODERM ) 5 % Place 1 patch onto the skin daily. Remove & Discard patch within 12 hours or as directed by MD 10 patch 0   olmesartan  (BENICAR ) 20 MG tablet Take 20 mg by mouth daily.     rosuvastatin  (CRESTOR ) 10 MG tablet Take 10 mg by mouth 3 (three) times a week.      megestrol   (MEGACE  ES) 625 MG/5ML suspension Take 5 mLs (625 mg total) by mouth daily. 150 mL 5   No current facility-administered medications for this visit.     VITALS:   Blood pressure (!) 101/53, pulse 73, temperature 98 F (36.7 C), temperature source Temporal, resp. rate 16, SpO2 96%.  Wt Readings from Last 3 Encounters:  02/06/24 225 lb (102.1 kg)  01/25/24 230 lb 4.8 oz (104.5 kg)  01/05/24 235 lb 3.2 oz (106.7 kg)    There is no height or weight on file to calculate BMI.    Onc Performance Status - 02/28/24 1440       ECOG Perf Status   ECOG Perf Status Capable of only limited selfcare, confined to bed or chair more than 50% of waking hours      KPS SCALE   KPS % SCORE Requires considerable assistance, and frequent medical care          PHYSICAL EXAM:   Physical Exam Constitutional:      General: She is not in acute distress.    Appearance: Normal appearance.  HENT:     Head: Normocephalic and atraumatic.  Cardiovascular:     Rate and Rhythm: Normal rate.  Pulmonary:     Effort: Pulmonary effort is normal. No respiratory distress.     Comments: Decreased breath sounds on the right side Abdominal:     General: There is no distension.  Neurological:     General: No focal deficit present.     Mental Status: She is alert and oriented to person, place, and time.  Psychiatric:        Mood and Affect: Mood normal.        Behavior: Behavior normal.      LABORATORY DATA:   I have reviewed the data as listed.  Results for orders placed or performed in visit on 02/28/24  Lactate dehydrogenase  Result Value Ref Range   LDH 319 (H) 105 - 235 U/L  Magnesium  Result Value Ref Range   Magnesium 1.8 1.7 - 2.4 mg/dL  CMP (Cancer Center only)  Result Value Ref Range   Sodium 134 (L) 135 - 145 mmol/L   Potassium 5.0 3.5 - 5.1 mmol/L   Chloride 100 98 - 111 mmol/L   CO2 26 22 - 32 mmol/L   Glucose, Bld 136 (H) 70 - 99 mg/dL   BUN 22 8 - 23 mg/dL   Creatinine 8.42  (H) 0.44 - 1.00 mg/dL   Calcium  9.6 8.9 - 10.3 mg/dL   Total Protein 7.0 6.5 - 8.1 g/dL   Albumin 3.1 (L) 3.5 - 5.0 g/dL   AST 16 15 - 41 U/L   ALT 27 0 - 44 U/L   Alkaline Phosphatase 66 38 - 126 U/L   Total Bilirubin 0.2 0.0 - 1.2 mg/dL  GFR, Estimated 32 (L) >60 mL/min   Anion gap 8 5 - 15  CBC with Differential (Cancer Center Only)  Result Value Ref Range   WBC Count 9.1 4.0 - 10.5 K/uL   RBC 3.91 3.87 - 5.11 MIL/uL   Hemoglobin 10.5 (L) 12.0 - 15.0 g/dL   HCT 67.3 (L) 63.9 - 53.9 %   MCV 83.4 80.0 - 100.0 fL   MCH 26.9 26.0 - 34.0 pg   MCHC 32.2 30.0 - 36.0 g/dL   RDW 81.2 (H) 88.4 - 84.4 %   Platelet Count 297 150 - 400 K/uL   nRBC 0.0 0.0 - 0.2 %   Neutrophils Relative % 80 %   Neutro Abs 7.3 1.7 - 7.7 K/uL   Lymphocytes Relative 10 %   Lymphs Abs 0.9 0.7 - 4.0 K/uL   Monocytes Relative 8 %   Monocytes Absolute 0.7 0.1 - 1.0 K/uL   Eosinophils Relative 1 %   Eosinophils Absolute 0.1 0.0 - 0.5 K/uL   Basophils Relative 0 %   Basophils Absolute 0.0 0.0 - 0.1 K/uL   Immature Granulocytes 1 %   Abs Immature Granulocytes 0.08 (H) 0.00 - 0.07 K/uL      RADIOGRAPHIC STUDIES:  I have personally reviewed the radiological images as listed and agree with the findings in the report.  MR Brain Wo Contrast EXAM: MRI BRAIN WITHOUT CONTRAST 02/04/2024 08:32:48 AM  TECHNIQUE: Multiplanar multisequence MRI of the head/brain was performed without the administration of intravenous contrast.  COMPARISON: MR Head 03/03/2023.  CLINICAL HISTORY: 89 year old female. Newly diagnosed stage 4 lung adenocarcinoma. Needs staging.  FINDINGS:  BRAIN AND VENTRICLES: No acute infarct. No intracranial hemorrhage. No mass. No midline shift. No hydrocephalus. Cerebral volume and major vascular flow voids are not significantly changed from last year. No cerebral edema. Elnor and white matter signal appears stable from last year and largely normal per age. No chronic cerebral blood  products on SWI. The sella is unremarkable. Accidentally, coronal T2 imaging was not performed. Normal flow voids.  ORBITS: No acute abnormality.  SINUSES AND MASTOIDS: No acute abnormality.  BONES AND SOFT TISSUES: Background bone marrow signal is stable. No acute soft tissue abnormality. Advanced chronic cervical spine degeneration redemonstrated visible spinal stenosis chronically most pronounced at C3-C4.  IMPRESSION: 1. No strong evidence of metastatic disease or acute intracranial abnormality on this NON-contrast exam.  Electronically signed by: Helayne Hurst MD 02/06/2024 02:20 PM EST RP Workstation: [id number] DG Chest 2 View EXAM: 2 VIEW(S) XRAY OF THE CHEST 02/06/2024 01:55:59 PM  COMPARISON: 01/17/2024 and earlier.  CLINICAL HISTORY: 89 year old female with recently diagnosed lung cancer, status post ultrasound guided thoracentesis last month.  FINDINGS:  LUNGS AND PLEURA: Other abnormal right lower lung opacity redemonstrated. No significant progression of residual right pleural effusion since 01/17/2024. Trace fluid now in the right minor fissure. Small left pleural effusion now difficult to exclude. No pulmonary edema. No pneumothorax.  HEART AND MEDIASTINUM: No acute abnormality of the cardiac and mediastinal silhouettes. Stable mediastinal contours.  BONES AND SOFT TISSUES: No acute osseous abnormality.  IMPRESSION: 1. No significant progression of residual right pleural effusion since 01/17/2024. Other stable abnormal right lower lung opacity. 2. Small left pleural effusion now difficult to exclude. No other new cardiopulmonary abnormality.  Electronically signed by: Helayne Hurst MD 02/06/2024 02:16 PM EST RP Workstation: [id number] CT Cervical Spine Wo Contrast EXAM: CT CERVICAL SPINE WITHOUT CONTRAST 02/06/2024 02:01:12 PM  TECHNIQUE: CT of the cervical spine was performed  without the administration of intravenous contrast. Multiplanar  reformatted images are provided for review. Automated exposure control, iterative reconstruction, and/or weight based adjustment of the mA/kV was utilized to reduce the radiation dose to as low as reasonably achievable.  COMPARISON: MRI 09/06/2018.  CLINICAL HISTORY: 89 year old female. Recently diagnosed lung cancer. Fall.  FINDINGS:  CERVICAL SPINE:  BONES AND ALIGNMENT: No acute fracture or traumatic malalignment. Similar straightening of cervical lordosis. Normal underlying bone mineralization for age.  DEGENERATIVE CHANGES: Advanced chronic cervical spine disc and endplate degeneration throughout, bulky at C3-C4. Chronic cervical spinal stenosis at C3-C4.  SOFT TISSUES: No prevertebral soft tissue swelling. Calcified cervical carotid atherosclerosis. Partially retropharyngeal course of the carotids also, normal variant. Motion artifact at the larynx.  IMPRESSION: 1. No acute traumatic injury identified in the cervical spine. 2. Advanced chronic cervical degeneration, chronic spinal cord mass effect at C3-C4.  Electronically signed by: Helayne Hurst MD 02/06/2024 02:14 PM EST RP Workstation: [id number] DG Knee Complete 4 Views Right EXAM: 4 OR MORE VIEW(S) XRAY OF THE KNEE 02/06/2024 01:55:59 PM  COMPARISON: None available.  CLINICAL HISTORY: knee pain, unwitnessed fall.  FINDINGS:  BONES AND JOINTS: No acute fracture. No focal osseous lesion. No joint dislocation. Small joint effusion. 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  SOFT TISSUES: Anterior soft tissue swelling.  IMPRESSION: 1. No acute fracture or dislocation identified. 2. Small joint effusion.  Anterior soft tissue swelling. 3. Severe degeneration.  Electronically signed by: Helayne Hurst MD 02/06/2024 02:09 PM EST RP Workstation: [id number] CT Head Wo Contrast EXAM: CT HEAD WITHOUT CONTRAST 02/06/2024 02:01:12 PM  TECHNIQUE: CT of the  head was performed without the administration of intravenous contrast. Automated exposure control, iterative reconstruction, and/or weight based adjustment of the mA/kV was utilized to reduce the radiation dose to as low as reasonably achievable.  COMPARISON: Brain MRI 03/03/2023  CLINICAL HISTORY: 89 year old female. Recently diagnosed lung cancer. Fall.  FINDINGS:  BRAIN AND VENTRICLES: No acute hemorrhage. No evidence of acute infarct. No hydrocephalus. No extra-axial collection. No mass effect or midline shift. Scattered bulky dural calcification incidentally noted. Normal brain volume for age. Mild for age periventricular white matter hypodensity. Calcified atherosclerosis at the skull base. No suspicious intracranial vascular hyperdensity.  ORBITS: No acute abnormality.  SINUSES: Previous right mastoidectomy is aerated. Other paranasal sinuses, middle ears and mastoids also normally aerated.  SOFT TISSUES AND SKULL: Anterior scalp soft tissue injury with combined hematoma / contusion, and soft tissue gas suggesting laceration. Underlying frontal bones appear intact. Posterior soft tissues appear negative. No skull fracture.  IMPRESSION: 1. Anterior Scalp soft tissue injury. No skull fracture identified. 2. Negative for age non-contrast head CT appearance of the brain.  Electronically signed by: Helayne Hurst MD 02/06/2024 02:08 PM EST RP Workstation: [id number]     CODE STATUS:  Code Status History     Date Active Date Inactive Code Status Order ID Comments User Context   05/27/2011 1605 05/28/2011 1729 Full Code [id number]  Gretta Norberta Bare, RN Inpatient       Orders Placed This Encounter  Procedures   CBC with Differential (Cancer Center Only)    Standing Status:   Future    Expiration Date:   02/27/2025   CMP (Cancer Center only)    Standing Status:   Future    Expiration Date:   02/27/2025   Lactate dehydrogenase    Standing Status:   Future     Expiration Date:  02/27/2025   Amb Referral to Palliative Care    Referral Priority:   Routine    Referral Type:   Consultation    Number of Visits Requested:   1   Ambulatory referral to Interventional Radiology    Referral Priority:   Routine    Referral Type:   Consultation    Referral Reason:   Specialty Services Required    Requested Specialty:   Interventional Radiology    Number of Visits Requested:   1     Future Appointments  Date Time Provider Department Center  03/01/2024  1:30 PM Augustin Montie CROME, RD [facility] None  03/10/2024  3:00 PM [facility] 1 [facility] [facility]  04/17/2024  2:15 PM [facility] PHLEBOTOMIST [facility] None  04/17/2024  2:45 PM Maricela Kawahara, Chinita, MD [facility] None  11/13/2024  3:30 PM Dohmeier, Dedra, MD [facility] None      This document was completed utilizing speech recognition software. Grammatical errors, random word insertions, pronoun errors, and incomplete sentences are an occasional consequence of this system due to software limitations, ambient noise, and hardware issues. Any formal questions or concerns about the content, text or information contained within the body of this dictation should be directly addressed to the provider for clarification.

## 2024-02-29 ENCOUNTER — Telehealth: Payer: Self-pay | Admitting: Nurse Practitioner

## 2024-02-29 NOTE — Telephone Encounter (Signed)
 I scheduled patient for 03/10/2024 ne palliative care appointment at 10:00 am. I advised patient to return my call if time/date does not work for her.

## 2024-02-29 NOTE — Telephone Encounter (Signed)
 I

## 2024-02-29 NOTE — Telephone Encounter (Signed)
 I spoke with patient's daughter to schedule palliative care appointment on 03/10/2024. Patient and daughter aware of date/time.

## 2024-03-01 ENCOUNTER — Inpatient Hospital Stay: Admitting: Dietician

## 2024-03-01 NOTE — Progress Notes (Signed)
 Nutrition Assessment  Reached out to daughter at preferred telephone#.  Reason for Assessment: MST screen for weight loss.    ASSESSMENT: Appointment originally set up with daughter to address weight loss.     Patient is a 89 y.o.  female with Stage IV A lung cancer. Past medical history of left sided breast cancer, hypertension, diabetes mellitus, CHF, CKD stage III, sleep apnea, and memory deficits.  He was evaluated by Dr. Autumn and option of reduced dose chemotherapy was offered but family decline treatment at this time.   As this is an advanced and not curable cancer there has been a referral to palliative care who will be meeting with patient and family next week.  Daughter states she totally forgot about our appointment and she was not with patient at this time.  She relayed the Megace  has been helping patient eat better but still not back to usual portions prior to her recent lung cancer diagnosis.   Anthropometrics:  25# weight loss since July   Height: 65" Weight:  02/06/24  225# UBW: 270-275# prior to June with PNA and Abx per daughter BMI: 37.44  NUTRITION DIAGNOSIS: Inadequate PO intake to meet increased nutrient needs, r/t cancer diagnosis  INTERVENTION:  Relayed that nutrition services are wrap around service provided at no charge and encouraged continued communication if desired for addressing nutritional impact symptoms (NIS). Goals of care currently are comfort and quality of life, relayed that I am available if she desired to reach out for future needs   Next Visit: PRN at patient or provider request  Micheline Craven, RDN, LDN Registered Dietitian, [facility] Part Time Remote (Usual office hours: Tuesday-Thursday) Cell: [phone]

## 2024-03-08 ENCOUNTER — Telehealth: Payer: Self-pay | Admitting: Nurse Practitioner

## 2024-03-08 NOTE — Telephone Encounter (Signed)
 I spoke with patient's daughter to reschedule 03/10/2024 palliative care appointment due to potential inclement weather. Patient aware of date/time change.

## 2024-03-10 ENCOUNTER — Inpatient Hospital Stay

## 2024-03-10 ENCOUNTER — Other Ambulatory Visit

## 2024-03-15 ENCOUNTER — Inpatient Hospital Stay: Admission: RE | Admit: 2024-03-15 | Discharge: 2024-03-15

## 2024-03-15 DIAGNOSIS — J90 Pleural effusion, not elsewhere classified: Secondary | ICD-10-CM

## 2024-03-20 ENCOUNTER — Encounter: Payer: Self-pay | Admitting: Nurse Practitioner

## 2024-03-20 ENCOUNTER — Inpatient Hospital Stay: Attending: Oncology | Admitting: Nurse Practitioner

## 2024-03-20 VITALS — BP 137/71 | HR 78 | Temp 99.1°F | Resp 16

## 2024-03-20 DIAGNOSIS — J910 Malignant pleural effusion: Secondary | ICD-10-CM | POA: Insufficient documentation

## 2024-03-20 DIAGNOSIS — C3411 Malignant neoplasm of upper lobe, right bronchus or lung: Secondary | ICD-10-CM | POA: Insufficient documentation

## 2024-03-20 DIAGNOSIS — C3490 Malignant neoplasm of unspecified part of unspecified bronchus or lung: Secondary | ICD-10-CM

## 2024-03-20 DIAGNOSIS — G893 Neoplasm related pain (acute) (chronic): Secondary | ICD-10-CM | POA: Insufficient documentation

## 2024-03-20 DIAGNOSIS — Z515 Encounter for palliative care: Secondary | ICD-10-CM

## 2024-03-20 DIAGNOSIS — Z923 Personal history of irradiation: Secondary | ICD-10-CM | POA: Insufficient documentation

## 2024-03-20 DIAGNOSIS — Z79899 Other long term (current) drug therapy: Secondary | ICD-10-CM | POA: Insufficient documentation

## 2024-03-20 DIAGNOSIS — R630 Anorexia: Secondary | ICD-10-CM | POA: Insufficient documentation

## 2024-03-20 DIAGNOSIS — Z853 Personal history of malignant neoplasm of breast: Secondary | ICD-10-CM | POA: Insufficient documentation

## 2024-03-20 DIAGNOSIS — Z87891 Personal history of nicotine dependence: Secondary | ICD-10-CM | POA: Insufficient documentation

## 2024-03-20 DIAGNOSIS — R530 Neoplastic (malignant) related fatigue: Secondary | ICD-10-CM | POA: Insufficient documentation

## 2024-03-20 DIAGNOSIS — R110 Nausea: Secondary | ICD-10-CM

## 2024-03-20 MED ORDER — PROCHLORPERAZINE MALEATE 10 MG PO TABS: 10.0000 mg | ORAL_TABLET | Freq: Four times a day (QID) | ORAL | 0 refills | Status: AC | PRN

## 2024-03-22 ENCOUNTER — Other Ambulatory Visit: Payer: Self-pay | Admitting: Oncology

## 2024-03-22 NOTE — Progress Notes (Signed)
 Palliative Medicine [facility]  [phone] [phone]   Name: CARLYANN PLACIDE Date: 03/22/2024 MRN: [id number]  DOB: 1934/12/09  Patient Care Team: Shayne Anes, MD as PCP - General (Internal Medicine) Nahser, Aleene PARAS, MD (Inactive) as PCP - Cardiology (Cardiology) Merrilyn Handler, MD (Inactive) as Surgeon (General Surgery) Lomax, Carlin ORN, MD (Inactive) (Obstetrics and Gynecology) Zaida, Zola SAILOR, MD as Consulting Physician (Pulmonary Disease)    REASON FOR CONSULTATION: KASHLYN SALINAS is a 89 y.o. female with oncologic medical history including left breast cancer (2010) s/p ectomy, adjuvant radiation and hormonal therapy, now with newly diagnosed stage IV lung adenocarcinoma with malignant pleural effusion.  Palliative is seeing patient for symptom management and goals of care.    SOCIAL HISTORY:     reports that she quit smoking about 63 years ago. Her smoking use included cigarettes. She has never used smokeless tobacco. She reports that she does not drink alcohol and does not use drugs.  ADVANCE DIRECTIVES:  On file. Her daughter [name] is her documented primary decision maker.   CODE STATUS: Limited code  PAST MEDICAL HISTORY: Past Medical History:  Diagnosis Date   Angio-edema    Arthritis    osteo   Asthma    none in years   Bradycardia    Breast cancer (HCC) 2010   Left   Chronic kidney disease    states elevated creatnine- followed by Dr Shayne   Complication of anesthesia    following tympanoplasty in 1997- HAD TIGHTENING AROUND CHEST FOLLOWING ANESTHESIA'- states has done well x 2 since   Diabetes mellitus    borderline/diet controlled   GERD (gastroesophageal reflux disease)    Gout    Hypertension    Obesity    Personal history of radiation therapy    Scoliosis    Sleep apnea    states setting on 11- followed by Dr Willeen yearly- last study 7 years ago   Sleep apnea with use of continuous  positive airway pressure (CPAP) 01/24/2013   Urticaria     PAST SURGICAL HISTORY:  Past Surgical History:  Procedure Laterality Date   BREAST LUMPECTOMY Left 07/2008   Left - Dr Merrilyn   CARPAL TUNNEL RELEASE     right   DILATION AND CURETTAGE OF UTERUS     IR THORACENTESIS ASP PLEURAL SPACE W/IMG GUIDE  01/17/2024   SHOULDER OPEN ROTATOR CUFF REPAIR  05/27/2011   Procedure: ROTATOR CUFF REPAIR SHOULDER OPEN;  Surgeon: Tanda DELENA Heading, MD;  Location: [facility];  Service: Orthopedics;  Laterality: Right;  Right Shoulder Rotator Cuff Repair complex     TONSILLECTOMY     TYMPANOPLASTY  1997   right    HEMATOLOGY/ONCOLOGY HISTORY:  Oncology History  Adenocarcinoma of lung (HCC)  01/25/2024 Initial Diagnosis   Adenocarcinoma of lung (HCC)   01/25/2024 Cancer Staging   Staging form: Lung, AJCC V9 - Clinical stage from 01/25/2024: Stage IVA (cT4, cNX, pM1a) - Signed by Autumn Millman, MD on 01/25/2024 Stage prefix: Initial diagnosis     ALLERGIES:  is allergic to shellfish allergy, sulfa antibiotics, elemental sulfur, and pantoprazole sodium.  MEDICATIONS:  Current Outpatient Medications  Medication Sig Dispense Refill   prochlorperazine  (COMPAZINE ) 10 MG tablet Take 1 tablet (10 mg total) by mouth every 6 (six) hours as needed for nausea or vomiting. 30 tablet 0   albuterol (VENTOLIN HFA) 108 (90 Base) MCG/ACT inhaler Inhale 2 puffs into the lungs every 4 (four) hours  as needed (as needed).     allopurinol  (ZYLOPRIM ) 300 MG tablet Take 150 mg by mouth daily. (Patient taking differently: Take 150 mg by mouth as needed.)     amLODipine  (NORVASC ) 5 MG tablet Take 5 mg by mouth daily.      aspirin  81 MG tablet Take 81 mg by mouth every other day.     carvedilol  (COREG ) 12.5 MG tablet Take 1 tablet (12.5 mg total) by mouth 2 (two) times daily with a meal. 180 tablet 3   chlorthalidone (HYGROTON) 25 MG tablet Take 12.5 mg by mouth daily.     Cholecalciferol (VITAMIN D ) 1000 UNITS  capsule Take 1,000 Units by mouth daily.     Coenzyme Q10 (CO Q 10 PO) Take 1 tablet by mouth daily.  (Patient taking differently: Take 1 tablet by mouth daily. Takes once per week due to forgetfulness)     EPIPEN  2-PAK 0.3 MG/0.3ML DEVI 0.3 mg.      glucose blood (ONETOUCH ULTRA) test strip 1 each by Other route as needed.     HYDROcodone -acetaminophen  (NORCO/VICODIN) 5-325 MG tablet Take 1-2 tablets by mouth every 6 (six) hours as needed for moderate pain. 10 tablet 0   levothyroxine (SYNTHROID) 50 MCG tablet Take 25 mcg by mouth daily.     lidocaine  (LIDODERM ) 5 % Place 1 patch onto the skin daily. Remove & Discard patch within 12 hours or as directed by MD 10 patch 0   megestrol  (MEGACE  ES) 625 MG/5ML suspension Take 5 mLs (625 mg total) by mouth daily. 150 mL 5   olmesartan  (BENICAR ) 20 MG tablet Take 20 mg by mouth daily.     rosuvastatin  (CRESTOR ) 10 MG tablet Take 10 mg by mouth 3 (three) times a week.      No current facility-administered medications for this visit.    VITAL SIGNS: BP 137/71   Pulse 78   Temp 99.1 F (37.3 C)   Resp 16   SpO2 97%  There were no vitals filed for this visit.  Estimated body mass index is 37.44 kg/m as calculated from the following:   Height as of 02/06/24: 5' 5 (1.651 m).   Weight as of 02/06/24: 225 lb (102.1 kg).  LABS: CBC:    Component Value Date/Time   WBC 9.1 02/28/2024 1402   WBC 16.0 (H) 02/06/2024 1504   HGB 10.5 (L) 02/28/2024 1402   HGB 11.8 07/13/2013 1017   HCT 32.6 (L) 02/28/2024 1402   HCT 36.1 07/13/2013 1017   PLT 297 02/28/2024 1402   PLT 254 07/13/2013 1017   MCV 83.4 02/28/2024 1402   MCV 81.9 07/13/2013 1017   NEUTROABS 7.3 02/28/2024 1402   NEUTROABS 5.2 07/13/2013 1017   LYMPHSABS 0.9 02/28/2024 1402   LYMPHSABS 1.9 07/13/2013 1017   MONOABS 0.7 02/28/2024 1402   MONOABS 0.8 07/13/2013 1017   EOSABS 0.1 02/28/2024 1402   EOSABS 0.1 07/13/2013 1017   BASOSABS 0.0 02/28/2024 1402   BASOSABS 0.0 07/13/2013  1017   Comprehensive Metabolic Panel:    Component Value Date/Time   NA 134 (L) 02/28/2024 1402   NA 140 07/06/2013 0900   K 5.0 02/28/2024 1402   K 3.9 07/06/2013 0900   CL 100 02/28/2024 1402   CL 106 07/05/2012 1101   CO2 26 02/28/2024 1402   CO2 20 (L) 07/06/2013 0900   BUN 22 02/28/2024 1402   BUN 21.2 07/06/2013 0900   CREATININE 1.57 (H) 02/28/2024 1402   CREATININE 1.4 (H) 07/06/2013  0900   GLUCOSE 136 (H) 02/28/2024 1402   GLUCOSE 170 (H) 07/06/2013 0900   GLUCOSE 182 (H) 07/05/2012 1101   CALCIUM  9.6 02/28/2024 1402   CALCIUM  9.4 07/06/2013 0900   AST 16 02/28/2024 1402   AST 14 07/06/2013 0900   ALT 27 02/28/2024 1402   ALT 13 07/06/2013 0900   ALKPHOS 66 02/28/2024 1402   ALKPHOS 102 07/06/2013 0900   BILITOT 0.2 02/28/2024 1402   BILITOT 0.37 07/06/2013 0900   PROT 7.0 02/28/2024 1402   PROT 7.3 07/06/2013 0900   ALBUMIN 3.1 (L) 02/28/2024 1402   ALBUMIN 3.6 07/06/2013 0900    RADIOGRAPHIC STUDIES: CT Chest Wo Contrast Result Date: 03/20/2024 EXAM: CT CHEST WITHOUT CONTRAST 03/15/2024 02:03:45 PM TECHNIQUE: CT of the chest was performed without the administration of intravenous contrast. Multiplanar reformatted images are provided for review. Automated exposure control, iterative reconstruction, and/or weight based adjustment of the mA/kV was utilized to reduce the radiation dose to as low as reasonably achievable. COMPARISON: Comparison is to pet dated 02/04/2024. Chest CT of 12/17/23. CLINICAL HISTORY: Follow up of presumed right upper lobe lung cancer and pleural metastasis. Multiple lung nodules. * Tracking Code: BO * FINDINGS: LIMITATIONS: Mild limitations secondary to patient body habitus and arm position, not raised above the head. Artifact degradation continued into the upper abdomen. MEDIASTINUM: Small middle mediastinum nodes are similar in that pathologic by size criteria. Hilar regions poorly evaluated without intravenous contrast. Right pleural base  nodule versus a subpleural lymph node about the anterior mediastinum measures 12 mm on image 1 or 2 / 2 versus 9 mm on the prior diagnostic CT. CARDIOVASCULAR: Mild cardiomegaly. 3-vessel coronary artery calcification. Thoracic aortic atherosclerosis. Normal aortic caliber. Without pericardial effusion. LUNGS AND PLEURA: Small to moderate right pleural effusion is similar inferiorly with resolved loculated component superiorly. Extensive right-sided pleural base nodularity, consistent with pleural metastasis. A pleural nodule within the right upper lobe measures 1.0 cm on image 53 / 9 and is new since the prior diagnostic CT. The dominant nodule within the right major fissure on prior PET measures 2.9 x 2.1 cm on image 64 / 8 versus 2.5 x 2.0 cm when re-measured in a similar fashion on the prior. Right lower lobe compressive atelectasis is minimally improved. Tiny parenchymal nodules are most apparent on the left. Example 4 mm left lower lobe pulmonary nodule on image 69 / 8, similar. No pneumothorax. SOFT TISSUES/BONES: Degenerative changes of both glenohumeral joints. Thoracic spondylosis. UPPER ABDOMEN: Limited images of the upper abdomen demonstrate mild right adrenal nodularity, which is similar. Normal left adrenal gland. Bilateral renal lesions which likely represent cysts and minimally complex cysts when compared to the 02/04/2024 PET. Artifact degradation continued into the upper abdomen. IMPRESSION: 1. Mildly progressive right-sided pleural based metastasis. 2. Small to moderate right pleural effusion, similar inferiorly and resolved superiorly. 3. Developing anterior mediastinal adenopathy versus progression of pleural metastasis. 4. Tiny parenchymal pulmonary nodules are suspicious for metastatic disease and not significantly changed. 5. Limitations as detailed above. Electronically signed by: Rockey Kilts MD 03/20/2024 12:22 PM EST RP Workstation: [id number]    PERFORMANCE STATUS (ECOG) : 2 -  Symptomatic, <50% confined to bed  Review of Systems  Constitutional:  Positive for activity change, appetite change and fatigue.  Respiratory:  Positive for shortness of breath.   Musculoskeletal:  Positive for arthralgias.  Unless otherwise noted, a complete review of systems is negative.  Physical Exam General: NAD Cardiovascular: regular rate and rhythm Pulmonary: diminished, dyspnea  with exertion Abdomen: soft, nontender, + bowel sounds Extremities: bilateral lower extremity edema, no joint deformities Skin: no rashes Neurological: Alert and oriented x3  Discussed the use of AI scribe software for clinical note transcription with the patient, who gave verbal consent to proceed.  History of Present Illness Shailyn EMMERSEN GARRAWAY is an 89 year old female with recently diagnosed stage IV lung cancer who presents to clinic for her initial palliative visit. She is accompanied by her daughter, [name], and son Toribio. Patient is in a wheelchair. She is alert and able to engage appropriately in discussions. Complaining of fatigue, hungry, and dyspnea with exertion.   I introduced myself, [place] RN, and Palliative's role in collaboration with the oncology team. Concept of Palliative Care was introduced as specialized medical care for people and their families living with serious illness.  It focuses on providing relief from the symptoms and stress of a serious illness.  The goal is to improve quality of life for both the patient and the family. Values and goals of care important to patient and family were attempted to be elicited.  Mrs. Geryl lives with her husband, who is 54 years old. They have been married for over 57 years. She has 4 biological children. 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   At home patient is able to assist some with ADLs however significantly limited at times due to fatigue requiring assistance with showering and other activities. She receives additional support from  a caregiver who assists with household tasks but not personal care. She is hesitant to allow the caregiver to assist with bathing and other needed area. Family shares history of a previous fall that resulted in injuries, including stitches to her forehead and right ear.  Her appetite has decreased, but she is currently eating three meals a day with the help of Megace  prescribed by Dr. Autumn. Despite this, she eats less than she used to but more than she has been. She occasionally experiences nausea, which is managed with ginger, although it is not always effective. No issues with constipation or diarrhea are reported. Education provided on use of as needed antiemetics in the event nausea is not controlled with natural methods.   Her sleep pattern is characterized by being a 'day sleeper,' and she has difficulty with energy levels, particularly in the mornings.  She has significant shortness of breath, which severely impacts her mobility, making walking short distances exhausting. In October, she underwent a paracentesis which helped alleviate her symptoms. She is awaiting pending results from a recent CT scan. Ms. Leitzke uses a rollator for mobility and is awaiting a wheelchair to assist with outings. Daughter shares CSW is working to get her a wheelchair ordered. She has a shower chair in the home. Family aware to reach out if assistance is needed with home equipment.   Ms. Etheridge denies any significant uncontrolled pain at this time. She is appreciative of this.   Goals of Care We discussed Ms. Mcclane's current illness and what it means in the larger context of her on-going co-morbidities. Natural disease trajectory and expectations were discussed.  Patient and family are realistic and their understanding of her extensive stage cancer and disease trajectory.  At this time they are continuing to take things 1 day at a time.  They have made decisions to forego any treatment options at this time  focusing on her quality of life as this is most important to them.  I empathetically approach discussions regarding  advanced directives, healthcare limitations, and CODE STATUS.  Patient is expressed wishes are not for her life to be prolonged by life-sustaining measures to include prolonged intubation.  She is open to ACLS and chest compressions.  I had extensive discussion with patient and family regarding what this would look like with consideration of her comorbidities.  Advised these efforts would prolong her life and could potentially cause additional trauma.  In the event of a cardiopulmonary event patient would want her family to be present for her last moments expressing desires at this time for ongoing family discussions as she is considering life-sustaining measures temporarily to allow family to become present with intentions of then shifting to a more comfort focused level of care allowing for natural death.  Patient has a documented advanced directive which identifies her daughter and son as medical decision makers in the event she cannot speak for herself with ability to make necessary treatment decisions.  Patient desires for her medical decision makers to withhold withdrawal life-prolonging measures in the event of a incurable or irreversible condition that will result in her death within a relatively short period of time and no artificial feedings.  I discussed the importance of continued conversation with family and their medical providers regarding overall plan of care and treatment options, ensuring decisions are within the context of the patients values and GOCs.  Assessment & Plan Established therapeutic relationship. Education provided on palliative's role in collaboration with their Oncology/Radiation team.  Malignant pleural effusion due to lung cancer Recurrent malignant pleural effusion secondary to advanced lung cancer, causing significant dyspnea and fatigue. Recent CT scan  performed to assess fluid accumulation. Awaiting radiologist's interpretation to determine need for thoracentesis. Discussed potential for Plurix catheter placement if thoracentesis becomes frequent, considering increased infection risk with repeated procedures. - Followed up with radiologist for CT scan interpretation. - Will consider thoracentesis if fluid accumulation is confirmed.  Will discuss further with Dr. Autumn.  - Education provided on potential need for for Pluerx catheter placement if thoracentesis becomes frequent. We discussed risk and benefits in the setting of symptom management and comfort focused.   Advanced lung cancer Contributing to multiple symptoms including pleural effusion, anorexia, fatigue, dyspnea, and nausea. No current active treatment plan. Discussed potential for increased frequency of pleural effusion and need for ongoing management.  Cancer-related anorexia and weight loss Anorexia and weight loss secondary to advanced lung cancer. Appetite has improved with Mannose, but intake remains reduced. No significant nausea or vomiting reported. - Continue Mannose to support appetite.  Cancer-related fatigue and weakness Significant fatigue and weakness impacting mobility and daily activities. Uses a rollator for ambulation. Awaiting wheelchair delivery to assist with mobility. Discussed importance of maintaining mobility to prevent further decline. - Ensure wheelchair delivery is expedited. - Encouraged use of rollator for mobility.  Cancer-related dyspnea Dyspnea secondary to malignant pleural effusion and advanced lung cancer. Symptoms exacerbated by fluid accumulation. Recent CT scan pending weill review and assess fluid status. Discussed potential for PluerX catheter placement if thoracentesis becomes frequent. - Followed up on CT scan results to determine need for thoracentesis.  Cancer-related nausea Intermittent nausea managed with ginger. No significant  vomiting reported. Discussed option of prescription antiemetic for breakthrough nausea. - Prescribed antiemetic for breakthrough nausea.Compazine  10mg  every 6 hours as needed.  - Continue ginger for mild nausea.  Advance directive/goals of care Extensive goals of care discussion with patient and family.  They are realistic and their understanding of significant cancer.  Patient has opted  to forego any treatment at this time focusing on her quality of life which is most important to her. - Ongoing discussions regarding CODE STATUS.  Patient desires no life-sustaining measures long-term however would want her family to be present at end-of-life if at all possible and open to temporary usage of life-prolonging machines until their arrival. - Patient has documented advanced directives.  Documents have been placed on file.  Her daughter and son are her documented medical decision makers.  Follow-up I will plan to follow-up with patient in 2-3 weeks.  Sooner if needed.  Patient expressed understanding and was in agreement with this plan. She also understands that She can call the clinic at any time with any questions, concerns, or complaints.   Thank you for your referral and allowing Palliative to assist in Mrs. Lewanda M Perryman's care.   Number and complexity of problems addressed: HIGH - 1 or more chronic illnesses with SEVERE exacerbation, progression, or side effects of treatment - advanced cancer, pain. Any controlled substances utilized were prescribed in the context of palliative care.  Visit consisted of counseling and education dealing with the complex and emotionally intense issues of symptom management and palliative care in the setting of serious and potentially life-threatening illness.  Signed by: Levon Borer, AGPCNP-BC Palliative Medicine Team/[facility]

## 2024-03-22 NOTE — Addendum Note (Signed)
 Addended by: PICKENPACK-COUSAR, Azim Gillingham N on: 03/22/2024 12:42 PM   Modules accepted: Orders

## 2024-03-27 ENCOUNTER — Ambulatory Visit (HOSPITAL_COMMUNITY)
Admission: RE | Admit: 2024-03-27 | Discharge: 2024-03-27 | Disposition: A | Discharge status: Home / Self Care | Source: Ambulatory Visit | Attending: Nurse Practitioner | Admitting: Nurse Practitioner

## 2024-03-27 ENCOUNTER — Other Ambulatory Visit: Payer: Self-pay | Admitting: Nurse Practitioner

## 2024-03-27 DIAGNOSIS — R630 Anorexia: Secondary | ICD-10-CM

## 2024-03-27 DIAGNOSIS — R530 Neoplastic (malignant) related fatigue: Secondary | ICD-10-CM

## 2024-03-27 DIAGNOSIS — C3490 Malignant neoplasm of unspecified part of unspecified bronchus or lung: Secondary | ICD-10-CM | POA: Insufficient documentation

## 2024-03-27 DIAGNOSIS — R0602 Shortness of breath: Secondary | ICD-10-CM

## 2024-03-27 DIAGNOSIS — Z7189 Other specified counseling: Secondary | ICD-10-CM

## 2024-03-27 DIAGNOSIS — Z515 Encounter for palliative care: Secondary | ICD-10-CM

## 2024-03-27 DIAGNOSIS — R110 Nausea: Secondary | ICD-10-CM

## 2024-03-27 NOTE — Progress Notes (Signed)
 Patient ID: Vicki [name], female   DOB: 04/05/1935, 89 y.o.   MRN: [id number] Patient presented to ultrasound dept today for therapeutic thoracentesis.  On limited ultrasound posterior right chest today there is only minimal free pleural fluid noted.  The collection is complex with consolidative changes noted as well.  Patient is currently asymptomatic.  Case was reviewed by Dr. Johann.  Procedure canceled.  Patient/family updated.

## 2024-03-28 ENCOUNTER — Ambulatory Visit (HOSPITAL_COMMUNITY)

## 2024-04-03 ENCOUNTER — Encounter: Payer: Self-pay | Admitting: Oncology

## 2024-04-04 ENCOUNTER — Encounter: Payer: Self-pay | Admitting: Nurse Practitioner

## 2024-04-04 ENCOUNTER — Emergency Department (HOSPITAL_COMMUNITY)

## 2024-04-04 ENCOUNTER — Other Ambulatory Visit: Payer: Self-pay

## 2024-04-04 ENCOUNTER — Encounter (HOSPITAL_COMMUNITY): Payer: Self-pay

## 2024-04-04 ENCOUNTER — Inpatient Hospital Stay (HOSPITAL_COMMUNITY)
Admission: EM | Admit: 2024-04-04 | Discharge: 2024-04-07 | DRG: 291 | Disposition: A | Discharge status: Hospice | Attending: Internal Medicine | Admitting: Internal Medicine

## 2024-04-04 DIAGNOSIS — Z881 Allergy status to other antibiotic agents status: Secondary | ICD-10-CM

## 2024-04-04 DIAGNOSIS — Z515 Encounter for palliative care: Secondary | ICD-10-CM

## 2024-04-04 DIAGNOSIS — N184 Chronic kidney disease, stage 4 (severe): Secondary | ICD-10-CM | POA: Diagnosis present

## 2024-04-04 DIAGNOSIS — E785 Hyperlipidemia, unspecified: Secondary | ICD-10-CM | POA: Diagnosis present

## 2024-04-04 DIAGNOSIS — I130 Hypertensive heart and chronic kidney disease with heart failure and stage 1 through stage 4 chronic kidney disease, or unspecified chronic kidney disease: Principal | ICD-10-CM | POA: Diagnosis present

## 2024-04-04 DIAGNOSIS — M4802 Spinal stenosis, cervical region: Secondary | ICD-10-CM | POA: Insufficient documentation

## 2024-04-04 DIAGNOSIS — Z66 Do not resuscitate: Secondary | ICD-10-CM | POA: Diagnosis present

## 2024-04-04 DIAGNOSIS — Z923 Personal history of irradiation: Secondary | ICD-10-CM

## 2024-04-04 DIAGNOSIS — R5383 Other fatigue: Secondary | ICD-10-CM | POA: Insufficient documentation

## 2024-04-04 DIAGNOSIS — E66812 Obesity, class 2: Secondary | ICD-10-CM | POA: Diagnosis present

## 2024-04-04 DIAGNOSIS — G893 Neoplasm related pain (acute) (chronic): Secondary | ICD-10-CM | POA: Diagnosis not present

## 2024-04-04 DIAGNOSIS — Z79899 Other long term (current) drug therapy: Secondary | ICD-10-CM

## 2024-04-04 DIAGNOSIS — Z882 Allergy status to sulfonamides status: Secondary | ICD-10-CM

## 2024-04-04 DIAGNOSIS — K7689 Other specified diseases of liver: Secondary | ICD-10-CM | POA: Diagnosis present

## 2024-04-04 DIAGNOSIS — K219 Gastro-esophageal reflux disease without esophagitis: Secondary | ICD-10-CM | POA: Diagnosis present

## 2024-04-04 DIAGNOSIS — Z91013 Allergy to seafood: Secondary | ICD-10-CM

## 2024-04-04 DIAGNOSIS — J9601 Acute respiratory failure with hypoxia: Principal | ICD-10-CM | POA: Diagnosis present

## 2024-04-04 DIAGNOSIS — E1122 Type 2 diabetes mellitus with diabetic chronic kidney disease: Secondary | ICD-10-CM | POA: Diagnosis present

## 2024-04-04 DIAGNOSIS — J910 Malignant pleural effusion: Secondary | ICD-10-CM | POA: Diagnosis not present

## 2024-04-04 DIAGNOSIS — Z803 Family history of malignant neoplasm of breast: Secondary | ICD-10-CM

## 2024-04-04 DIAGNOSIS — J4520 Mild intermittent asthma, uncomplicated: Secondary | ICD-10-CM | POA: Diagnosis present

## 2024-04-04 DIAGNOSIS — Z6839 Body mass index (BMI) 39.0-39.9, adult: Secondary | ICD-10-CM

## 2024-04-04 DIAGNOSIS — C782 Secondary malignant neoplasm of pleura: Secondary | ICD-10-CM | POA: Diagnosis present

## 2024-04-04 DIAGNOSIS — I5033 Acute on chronic diastolic (congestive) heart failure: Secondary | ICD-10-CM | POA: Diagnosis present

## 2024-04-04 DIAGNOSIS — Z833 Family history of diabetes mellitus: Secondary | ICD-10-CM

## 2024-04-04 DIAGNOSIS — M109 Gout, unspecified: Secondary | ICD-10-CM | POA: Diagnosis present

## 2024-04-04 DIAGNOSIS — C3491 Malignant neoplasm of unspecified part of right bronchus or lung: Secondary | ICD-10-CM | POA: Diagnosis present

## 2024-04-04 DIAGNOSIS — Z853 Personal history of malignant neoplasm of breast: Secondary | ICD-10-CM

## 2024-04-04 DIAGNOSIS — Z7189 Other specified counseling: Secondary | ICD-10-CM

## 2024-04-04 DIAGNOSIS — C3411 Malignant neoplasm of upper lobe, right bronchus or lung: Secondary | ICD-10-CM | POA: Diagnosis present

## 2024-04-04 DIAGNOSIS — I5031 Acute diastolic (congestive) heart failure: Secondary | ICD-10-CM

## 2024-04-04 DIAGNOSIS — D72829 Elevated white blood cell count, unspecified: Secondary | ICD-10-CM | POA: Insufficient documentation

## 2024-04-04 DIAGNOSIS — J9811 Atelectasis: Secondary | ICD-10-CM | POA: Diagnosis present

## 2024-04-04 DIAGNOSIS — I2510 Atherosclerotic heart disease of native coronary artery without angina pectoris: Secondary | ICD-10-CM | POA: Diagnosis present

## 2024-04-04 DIAGNOSIS — M419 Scoliosis, unspecified: Secondary | ICD-10-CM | POA: Diagnosis present

## 2024-04-04 DIAGNOSIS — E8770 Fluid overload, unspecified: Secondary | ICD-10-CM | POA: Diagnosis present

## 2024-04-04 DIAGNOSIS — Z9889 Other specified postprocedural states: Secondary | ICD-10-CM

## 2024-04-04 DIAGNOSIS — E039 Hypothyroidism, unspecified: Secondary | ICD-10-CM | POA: Diagnosis present

## 2024-04-04 DIAGNOSIS — E875 Hyperkalemia: Secondary | ICD-10-CM | POA: Diagnosis present

## 2024-04-04 DIAGNOSIS — I10 Essential (primary) hypertension: Secondary | ICD-10-CM | POA: Diagnosis present

## 2024-04-04 DIAGNOSIS — E662 Morbid (severe) obesity with alveolar hypoventilation: Secondary | ICD-10-CM | POA: Diagnosis present

## 2024-04-04 DIAGNOSIS — M1990 Unspecified osteoarthritis, unspecified site: Secondary | ICD-10-CM | POA: Diagnosis present

## 2024-04-04 DIAGNOSIS — R531 Weakness: Secondary | ICD-10-CM

## 2024-04-04 DIAGNOSIS — C3490 Malignant neoplasm of unspecified part of unspecified bronchus or lung: Secondary | ICD-10-CM

## 2024-04-04 DIAGNOSIS — Z1152 Encounter for screening for COVID-19: Secondary | ICD-10-CM

## 2024-04-04 DIAGNOSIS — G4733 Obstructive sleep apnea (adult) (pediatric): Secondary | ICD-10-CM

## 2024-04-04 DIAGNOSIS — Z87891 Personal history of nicotine dependence: Secondary | ICD-10-CM

## 2024-04-04 DIAGNOSIS — Z7982 Long term (current) use of aspirin: Secondary | ICD-10-CM

## 2024-04-04 DIAGNOSIS — R530 Neoplastic (malignant) related fatigue: Secondary | ICD-10-CM | POA: Diagnosis not present

## 2024-04-04 DIAGNOSIS — Z9089 Acquired absence of other organs: Secondary | ICD-10-CM

## 2024-04-04 DIAGNOSIS — G319 Degenerative disease of nervous system, unspecified: Secondary | ICD-10-CM | POA: Insufficient documentation

## 2024-04-04 DIAGNOSIS — R918 Other nonspecific abnormal finding of lung field: Secondary | ICD-10-CM | POA: Diagnosis present

## 2024-04-04 DIAGNOSIS — E871 Hypo-osmolality and hyponatremia: Secondary | ICD-10-CM | POA: Diagnosis present

## 2024-04-04 DIAGNOSIS — Z79890 Hormone replacement therapy (postmenopausal): Secondary | ICD-10-CM

## 2024-04-04 DIAGNOSIS — R0609 Other forms of dyspnea: Principal | ICD-10-CM

## 2024-04-04 DIAGNOSIS — D649 Anemia, unspecified: Secondary | ICD-10-CM | POA: Diagnosis present

## 2024-04-04 DIAGNOSIS — Z888 Allergy status to other drugs, medicaments and biological substances status: Secondary | ICD-10-CM

## 2024-04-04 DIAGNOSIS — R630 Anorexia: Secondary | ICD-10-CM | POA: Diagnosis not present

## 2024-04-04 DIAGNOSIS — E441 Mild protein-calorie malnutrition: Secondary | ICD-10-CM | POA: Diagnosis present

## 2024-04-04 LAB — PRO BRAIN NATRIURETIC PEPTIDE: Pro Brain Natriuretic Peptide: 441 pg/mL — ABNORMAL HIGH

## 2024-04-04 LAB — CBC WITH DIFFERENTIAL/PLATELET
Abs Immature Granulocytes: 0.19 K/uL — ABNORMAL HIGH (ref 0.00–0.07)
Basophils Absolute: 0 K/uL (ref 0.0–0.1)
Basophils Relative: 0 %
Eosinophils Absolute: 0.1 K/uL (ref 0.0–0.5)
Eosinophils Relative: 1 %
HCT: 33.1 % — ABNORMAL LOW (ref 36.0–46.0)
Hemoglobin: 10.2 g/dL — ABNORMAL LOW (ref 12.0–15.0)
Immature Granulocytes: 2 %
Lymphocytes Relative: 14 %
Lymphs Abs: 1.4 K/uL (ref 0.7–4.0)
MCH: 26.5 pg (ref 26.0–34.0)
MCHC: 30.8 g/dL (ref 30.0–36.0)
MCV: 86 fL (ref 80.0–100.0)
Monocytes Absolute: 0.8 K/uL (ref 0.1–1.0)
Monocytes Relative: 8 %
Neutro Abs: 7.3 K/uL (ref 1.7–7.7)
Neutrophils Relative %: 75 %
Platelets: 332 K/uL (ref 150–400)
RBC: 3.85 MIL/uL — ABNORMAL LOW (ref 3.87–5.11)
RDW: 18.1 % — ABNORMAL HIGH (ref 11.5–15.5)
WBC: 9.8 K/uL (ref 4.0–10.5)
nRBC: 0 % (ref 0.0–0.2)

## 2024-04-04 LAB — BASIC METABOLIC PANEL WITH GFR
Anion gap: 9 (ref 5–15)
Anion gap: 8 (ref 5–15)
BUN: 29 mg/dL — ABNORMAL HIGH (ref 8–23)
BUN: 30 mg/dL — ABNORMAL HIGH (ref 8–23)
CO2: 24 mmol/L (ref 22–32)
CO2: 24 mmol/L (ref 22–32)
Calcium: 8.9 mg/dL (ref 8.9–10.3)
Calcium: 8.6 mg/dL — ABNORMAL LOW (ref 8.9–10.3)
Chloride: 99 mmol/L (ref 98–111)
Chloride: 100 mmol/L (ref 98–111)
Creatinine, Ser: 1.76 mg/dL — ABNORMAL HIGH (ref 0.44–1.00)
Creatinine, Ser: 1.78 mg/dL — ABNORMAL HIGH (ref 0.44–1.00)
GFR, Estimated: 28 mL/min — ABNORMAL LOW
GFR, Estimated: 27 mL/min — ABNORMAL LOW
Glucose, Bld: 116 mg/dL — ABNORMAL HIGH (ref 70–99)
Glucose, Bld: 164 mg/dL — ABNORMAL HIGH (ref 70–99)
Potassium: 6 mmol/L — ABNORMAL HIGH (ref 3.5–5.1)
Potassium: 5.5 mmol/L — ABNORMAL HIGH (ref 3.5–5.1)
Sodium: 132 mmol/L — ABNORMAL LOW (ref 135–145)
Sodium: 133 mmol/L — ABNORMAL LOW (ref 135–145)

## 2024-04-04 LAB — RESP PANEL BY RT-PCR (RSV, FLU A&B, COVID)  RVPGX2
Influenza A by PCR: NEGATIVE
Influenza B by PCR: NEGATIVE
Resp Syncytial Virus by PCR: NEGATIVE
SARS Coronavirus 2 by RT PCR: NEGATIVE

## 2024-04-04 LAB — HEPATIC FUNCTION PANEL
ALT: 14 U/L (ref 0–44)
AST: 16 U/L (ref 15–41)
Albumin: 3.2 g/dL — ABNORMAL LOW (ref 3.5–5.0)
Alkaline Phosphatase: 52 U/L (ref 38–126)
Bilirubin, Direct: <0.1 mg/dL (ref 0.0–0.2)
Total Bilirubin: <0.2 mg/dL (ref 0.0–1.2)
Total Protein: 7 g/dL (ref 6.5–8.1)

## 2024-04-04 LAB — TROPONIN T, HIGH SENSITIVITY
Troponin T High Sensitivity: 44 ng/L — ABNORMAL HIGH (ref 0–19)
Troponin T High Sensitivity: 46 ng/L — ABNORMAL HIGH (ref 0–19)

## 2024-04-04 LAB — I-STAT CG4 LACTIC ACID, ED: Lactic Acid, Venous: 0.8 mmol/L (ref 0.5–1.9)

## 2024-04-04 MED ORDER — ONDANSETRON HCL 4 MG/2ML IJ SOLN: 4.0000 mg | Freq: Four times a day (QID) | INTRAMUSCULAR | Status: DC | PRN

## 2024-04-04 MED ORDER — ONDANSETRON HCL 4 MG PO TABS: 4.0000 mg | ORAL_TABLET | Freq: Four times a day (QID) | ORAL | Status: DC | PRN

## 2024-04-04 MED ORDER — HEPARIN SODIUM (PORCINE) 5000 UNIT/ML IJ SOLN
5000.0000 [IU] | Freq: Three times a day (TID) | INTRAMUSCULAR | Status: DC
  Administered 2024-04-05 – 2024-04-07 (×7): 5000 [IU] via SUBCUTANEOUS
  Filled 2024-04-04 (×7): qty 1

## 2024-04-04 MED ORDER — SODIUM ZIRCONIUM CYCLOSILICATE 10 G PO PACK
10.0000 g | PACK | Freq: Once | ORAL | Status: AC
  Administered 2024-04-04: 10 g via ORAL
  Filled 2024-04-04: qty 1

## 2024-04-04 MED ORDER — ACETAMINOPHEN 650 MG RE SUPP: 650.0000 mg | Freq: Four times a day (QID) | RECTAL | Status: DC | PRN

## 2024-04-04 MED ORDER — ACETAMINOPHEN 325 MG PO TABS: 650.0000 mg | ORAL_TABLET | Freq: Four times a day (QID) | ORAL | Status: DC | PRN

## 2024-04-04 MED ORDER — CARVEDILOL 12.5 MG PO TABS
12.5000 mg | ORAL_TABLET | Freq: Two times a day (BID) | ORAL | Status: DC
  Administered 2024-04-05 – 2024-04-07 (×5): 12.5 mg via ORAL
  Filled 2024-04-04 (×5): qty 1

## 2024-04-04 MED ORDER — ALLOPURINOL 300 MG PO TABS
150.0000 mg | ORAL_TABLET | Freq: Every day | ORAL | Status: DC
  Filled 2024-04-04: qty 1
  Filled 2024-04-04 (×2): qty 2

## 2024-04-04 MED ORDER — FUROSEMIDE 10 MG/ML IJ SOLN
40.0000 mg | Freq: Once | INTRAMUSCULAR | Status: AC
  Administered 2024-04-04: 40 mg via INTRAVENOUS
  Filled 2024-04-04: qty 4

## 2024-04-04 NOTE — Telephone Encounter (Signed)
 Please see the MyChart message reply(ies) for my assessment and plan.    This patient gave consent for this Medical Advice Message and is aware that it may result in a bill to [employer], as well as the possibility of receiving a bill for a co-payment or deductible. They are an established patient, but are not seeking medical advice exclusively about a problem treated during an in person or video visit in the last seven days. I did not recommend an in person or video visit within seven days of my reply.    I spent a total of 25 minutes cumulative time within 7 days through [employer].  Fannie Borer, NP

## 2024-04-04 NOTE — ED Provider Notes (Signed)
" ° °  ED Course / MDM   Clinical Course as of 04/04/24 1804  Tue Apr 04, 2024  1239 Daughter is here with a little more history.  She was diagnosed with lung cancer after they did a thoracentesis for fluid around the lung and found malignant cells.  She was not a candidate for immune therapy and was not felt to be able to be strong enough to undergo chemotherapy.  It sounds like they have mostly been monitoring her.  Been more short of breath for a couple of weeks.  Had an CAT scan and they found a little bit of fluids to try to do a thoracentesis but there was not enough to tap.  Daughter is concerned maybe this is congestive heart failure or something else causing her to be short of breath.  Also has seen her to have leg swelling.  Shortness of breath is mostly at night where she is gasping for air despite her CPAP. [MB]  1326 Chest x-ray showing probable effusion on right.  Awaiting radiology reading. [MB]  1452 Nurses putting the patient in for an IV consult that she has poor access. [MB]  1508 Received sign out from Dr. Towana pending labs. P/w shortness of breath. Hx of lung cancer  [WS]  1803 Labs with mild troponin elevation, mild elevation in proBNP.  Also with mild hyperkalemia without underlying EKG changes, mild AKI.  Chest x-ray with small pleural effusion, ordered CT scan and appears larger.  Overall suspect some component of CHF because she does have lower extremity swelling, JVD on exam.  Family reports she has been very weak at home and having trouble getting around just due to her shortness of breath.  She will need to be admitted.  I discussed with Dr. Celinda and he has admitted the patient. [WS]    Clinical Course User Index [MB] Towana Ozell BROCKS, MD [WS] Francesca Elsie CROME, MD   Medical Decision Making Amount and/or Complexity of Data Reviewed Labs: ordered. Radiology: ordered.  Risk Prescription drug management. Decision regarding hospitalization.       Francesca Elsie CROME, MD 04/04/24 [phone]  "

## 2024-04-04 NOTE — H&P (Addendum)
 " History and Physical    Patient: Vicki [name] FMW:[id number] DOB: 09-11-1934 DOA: 04/04/2024 DOS: the patient was seen and examined on 04/04/2024 PCP: Shayne Anes, MD  Patient coming from: Home  Chief Complaint:  Chief Complaint  Patient presents with   Shortness of Breath   HPI: Vicki [name] is a 89 y.o. female with medical history significant of angioedema, osteoarthritis, asthma, bradycardia, stage IV CKD, left breast cancer, CKD, type 2 diabetes, GERD, gout, hypertension, scoliosis, sleep apnea on CPAP, urticaria, stage IV lung adenocarcinoma, chronic diastolic heart failure who presented to the emergency department with complaints of progressively worse dyspnea for the past 2 days.  She has also had orthopnea.  She was hypoxic at 89% when EMS arrived to bring her to the hospital.  No chest pain, palpitations, diaphoresis or PND.   Patient apparently has a liberal sodium intake. He denied fever, chills, rhinorrhea, sore throat, wheezing or hemoptysis. No abdominal pain, nausea, emesis, diarrhea, constipation, melena or hematochezia.  No flank pain, dysuria, frequency or hematuria.   Lab work: CBC showed white count 9.8, hemoglobin 10.2 g/dL and platelets 667.  Negative coronavirus, influenza and RSV PCR test.  First troponin 44 ng/L and proBNP 441 pg/mL.  Lactic acid was normal.  LFTs showed an albumin of 3.2 g/dL, but was otherwise unremarkable.  BMP showed a sodium 132, potassium 6.0, chloride 99 and CO2 24 mmol/L.  Calcium  8.9, glucose 116, BUN 29 and creatinine 1.76 mg/dL.  Imaging: Portable 1 view chest radiograph showing low lung volumes, limiting evaluation.  Small to moderate right pleural effusions with associated right basilar opacity, likely ductal ectasis.  Atherosclerotic plaque seen.  ED course: Initial vital signs were temperature 99.6 F, pulse 91, respiration 24, BP 89/60 mmHg and O2 sat 94% on room air.  The patient received 2500 mL of LR bolus followed by LR at 150  mL/h, cefepime 2 g IVPB, metronidazole 500 mg IVPB and vancomycin 1000 mg IVPB.  Review of Systems: As mentioned in the history of present illness. All other systems reviewed and are negative. Past Medical History:  Diagnosis Date   Angio-edema    Arthritis    osteo   Asthma    none in years   Bradycardia    Breast cancer (HCC) 2010   Left   Chronic kidney disease    states elevated creatnine- followed by Dr Shayne   Complication of anesthesia    following tympanoplasty in 1997- HAD TIGHTENING AROUND CHEST FOLLOWING ANESTHESIA'- states has done well x 2 since   Diabetes mellitus    borderline/diet controlled   GERD (gastroesophageal reflux disease)    Gout    Hypertension    Obesity    Personal history of radiation therapy    Scoliosis    Sleep apnea    states setting on 11- followed by Dr Willeen yearly- last study 7 years ago   Sleep apnea with use of continuous positive airway pressure (CPAP) 01/24/2013   Urticaria    Past Surgical History:  Procedure Laterality Date   BREAST LUMPECTOMY Left 07/2008   Left - Dr Merrilyn   CARPAL TUNNEL RELEASE     right   DILATION AND CURETTAGE OF UTERUS     IR THORACENTESIS ASP PLEURAL SPACE W/IMG GUIDE  01/17/2024   SHOULDER OPEN ROTATOR CUFF REPAIR  05/27/2011   Procedure: ROTATOR CUFF REPAIR SHOULDER OPEN;  Surgeon: Tanda DELENA Heading, MD;  Location: [facility];  Service: Orthopedics;  Laterality: Right;  Right  Shoulder Rotator Cuff Repair complex     TONSILLECTOMY     TYMPANOPLASTY  1997   right   Social History:  reports that she quit smoking about 63 years ago. Her smoking use included cigarettes. She has never used smokeless tobacco. She reports that she does not drink alcohol and does not use drugs.  Allergies[1]  Family History  Problem Relation Age of Onset   Diabetes Mother    Breast cancer Sister        Age unknown   Diabetes Sister    Diabetes Brother    Breast cancer Maternal Grandmother        Age unknown     Prior to Admission medications  Medication Sig Start Date End Date Taking? Authorizing Provider  albuterol (VENTOLIN HFA) 108 (90 Base) MCG/ACT inhaler Inhale 2 puffs into the lungs every 4 (four) hours as needed (as needed). 10/27/22   [provider]  allopurinol  (ZYLOPRIM ) 300 MG tablet Take 150 mg by mouth daily. Patient taking differently: Take 150 mg by mouth as needed.    [provider]  amLODipine  (NORVASC ) 5 MG tablet Take 5 mg by mouth daily.  08/06/16   [provider]  aspirin  81 MG tablet Take 81 mg by mouth every other day.    [provider]  carvedilol  (COREG ) 12.5 MG tablet Take 1 tablet (12.5 mg total) by mouth 2 (two) times daily with a meal. 11/17/23   Court Dorn PARAS, MD  chlorthalidone (HYGROTON) 25 MG tablet Take 12.5 mg by mouth daily.    [provider]  Cholecalciferol (VITAMIN D ) 1000 UNITS capsule Take 1,000 Units by mouth daily.    [provider]  Coenzyme Q10 (CO Q 10 PO) Take 1 tablet by mouth daily.  Patient taking differently: Take 1 tablet by mouth daily. Takes once per week due to forgetfulness    [provider]  EPIPEN  2-PAK 0.3 MG/0.3ML DEVI 0.3 mg.  04/23/12   [provider]  glucose blood (ONETOUCH ULTRA) test strip 1 each by Other route as needed. 07/30/20   [provider]  HYDROcodone -acetaminophen  (NORCO/VICODIN) 5-325 MG tablet Take 1-2 tablets by mouth every 6 (six) hours as needed for moderate pain. 05/29/15   Zackowski, Scott, MD  levothyroxine (SYNTHROID) 50 MCG tablet Take 25 mcg by mouth daily. 11/01/18   [provider]  lidocaine  (LIDODERM ) 5 % Place 1 patch onto the skin daily. Remove & Discard patch within 12 hours or as directed by MD 12/18/17   Nettie, April, MD  megestrol  (MEGACE  ES) 625 MG/5ML suspension Take 5 mLs (625 mg total) by mouth daily. 02/28/24   Pasam, Chinita, MD  olmesartan  (BENICAR ) 20 MG tablet Take 20 mg by mouth daily. 12/07/19    [provider]  prochlorperazine  (COMPAZINE ) 10 MG tablet Take 1 tablet (10 mg total) by mouth every 6 (six) hours as needed for nausea or vomiting. 03/20/24   Pickenpack-Cousar, Fannie SAILOR, NP  rosuvastatin  (CRESTOR ) 10 MG tablet Take 10 mg by mouth 3 (three) times a week.     [provider]    Physical Exam: Vitals:   04/04/24 1211 04/04/24 1216 04/04/24 1539  BP: (!) 158/95  (!) 192/85  Pulse: 72  70  Resp: 20  19  Temp: 98.7 F (37.1 C)  98.8 F (37.1 C)  TempSrc: Oral  Oral  SpO2: 93%  95%  Weight:  102.1 kg   Height:  5' 5 (1.651 m)    Physical  Exam Vitals reviewed.  Constitutional:      General: She is awake. She is not in acute distress.    Appearance: She is well-developed. She is obese. She is ill-appearing.     Interventions: Nasal cannula in place.  HENT:     Head: Normocephalic.     Nose: No rhinorrhea.     Mouth/Throat:     Mouth: Mucous membranes are moist.  Eyes:     General: No scleral icterus.    Pupils: Pupils are equal, round, and reactive to light.  Neck:     Vascular: No JVD.  Cardiovascular:     Rate and Rhythm: Normal rate and regular rhythm.     Heart sounds: S1 normal and S2 normal.  Pulmonary:     Breath sounds: Examination of the right-lower field reveals rales. Examination of the left-lower field reveals rales. Rales present. No decreased breath sounds, wheezing or rhonchi.  Abdominal:     General: Bowel sounds are normal. There is no distension.     Palpations: Abdomen is soft.     Tenderness: There is no abdominal tenderness. There is no right CVA tenderness or left CVA tenderness.  Musculoskeletal:     Cervical back: Neck supple.     Right lower leg: 3+ Edema present.     Left lower leg: 3+ Edema present.  Skin:    General: Skin is warm and dry.  Neurological:     General: No focal deficit present.     Mental Status: She is alert and oriented to person, place, and time.  Psychiatric:        Mood and Affect: Mood  normal. Mood is not anxious.        Behavior: Behavior normal. Behavior is not agitated. Behavior is cooperative.     Data Reviewed:  Results are pending, will review when available. 11/19/2022 echocardiogram report. IMPRESSIONS:   1. Left ventricular ejection fraction, by estimation, is 60 to 65%. The  left ventricle has normal function. The left ventricle has no regional  wall motion abnormalities. Left ventricular diastolic parameters were  normal. The average left ventricular  global longitudinal strain is -18.2 %. The global longitudinal strain is  normal.   2. Right ventricular systolic function is normal. The right ventricular  size is normal. There is normal pulmonary artery systolic pressure.   3. Mild mitral valve regurgitation.   4. The aortic valve is tricuspid. Aortic valve regurgitation is not  visualized.   EKG: Vent. rate 70 BPM PR interval 115 ms QRS duration 80 ms QT/QTcB 351/379 ms P-R-T axes 41 30 77 Sinus rhythm Borderline short PR interval  Assessment and Plan: Principal Problem:   Acute respiratory failure with hypoxia (HCC) In the setting of:   Volume overload  Observation/telemetry. Supplemental oxygen as needed. Sodium and fluid restriction. Continue furosemide  40 mg IVP twice daily. - Will await for GFR and electrolytes in AM. Monitor daily weights, intake and output. Monitor renal function electrolytes. Check echocardiogram in a.m.  Active Problems:   Hyperkalemia Still taking olmesartan ? Lokelma  10 g p.o. x 1 given. Received furosemide  earlier. Follow-up potassium level.    Hyponatremia In the setting of hypervolemia.    Hypertension Continue carvedilol  twice daily. On furosemide  as above.    OSA on CPAP   Obesity hypoventilation syndrome (HCC) Continue CPAP at bedtime.    CKD (chronic kidney disease) stage 4, GFR 15-29 ml/min (HCC)   Adenocarcinoma of lung (HCC) Follow-up with Dr. Pasam as scheduled.  Hyperlipidemia Follow-up with primary care provider for treatment options.    Hypothyroidism Continue levothyroxine pending med rec.    Gastro-esophageal reflux disease without esophagitis Famotidine or antiacid as needed.    Mild protein malnutrition In the setting of anemia and malignancy. May benefit from protein supplementation. Consider nutritional services evaluation. Follow-up albumin level. Monitor hematocrit and hemoglobin.    Advance Care Planning:   Code Status: Full Code   Consults:   Family Communication:   Severity of Illness: The appropriate patient status for this patient is OBSERVATION. Observation status is judged to be reasonable and necessary in order to provide the required intensity of service to ensure the patient's safety. The patient's presenting symptoms, physical exam findings, and initial radiographic and laboratory data in the context of their medical condition is felt to place them at decreased risk for further clinical deterioration. Furthermore, it is anticipated that the patient will be medically stable for discharge from the hospital within 2 midnights of admission.   Author: Alm Dorn Castor, MD 04/04/2024 5:07 PM  For on call review [email or web address].   This document was prepared using Dragon voice recognition software and may contain some unintended transcription errors.     [1]  Allergies Allergen Reactions   Shellfish Allergy Swelling   Sulfa Antibiotics Hives   Elemental Sulfur Swelling and Rash   Pantoprazole Sodium Palpitations   "

## 2024-04-04 NOTE — ED Notes (Signed)
 Attempted Ivs and had other RN try

## 2024-04-04 NOTE — ED Provider Notes (Signed)
 " [place] EMERGENCY DEPARTMENT AT [facility] Provider Note   CSN: [id number] Arrival date & time: 04/04/24  1156     Patient presents with: Shortness of Breath   Vicki [name] is a 89 y.o. female.  She is brought in by ambulance from home for increased shortness of breath.  She said she has felt bad since yesterday.  Short of breath and very fatigued.  Was hypoxic and placed on nasal cannula.  Not normally using oxygen.  She has had some cough, she is not sure if it is productive.  She does endorse known cancer but does not know who her oncologist is.  Denies any vomiting or diarrhea.   The history is provided by the patient.  Shortness of Breath Severity:  Moderate Onset quality:  Gradual Duration:  2 days Timing:  Constant Progression:  Unchanged Chronicity:  New Associated symptoms: cough   Associated symptoms: no abdominal pain, no chest pain, no fever, no hemoptysis and no vomiting        Prior to Admission medications  Medication Sig Start Date End Date Taking? Authorizing Provider  albuterol (VENTOLIN HFA) 108 (90 Base) MCG/ACT inhaler Inhale 2 puffs into the lungs every 4 (four) hours as needed (as needed). 10/27/22   [provider]  allopurinol  (ZYLOPRIM ) 300 MG tablet Take 150 mg by mouth daily. Patient taking differently: Take 150 mg by mouth as needed.    [provider]  amLODipine  (NORVASC ) 5 MG tablet Take 5 mg by mouth daily.  08/06/16   [provider]  aspirin  81 MG tablet Take 81 mg by mouth every other day.    [provider]  carvedilol  (COREG ) 12.5 MG tablet Take 1 tablet (12.5 mg total) by mouth 2 (two) times daily with a meal. 11/17/23   Court Dorn PARAS, MD  chlorthalidone (HYGROTON) 25 MG tablet Take 12.5 mg by mouth daily.    [provider]  Cholecalciferol (VITAMIN D ) 1000 UNITS capsule Take 1,000 Units by mouth daily.    [provider]  Coenzyme Q10 (CO Q 10 PO) Take 1 tablet by  mouth daily.  Patient taking differently: Take 1 tablet by mouth daily. Takes once per week due to forgetfulness    [provider]  EPIPEN  2-PAK 0.3 MG/0.3ML DEVI 0.3 mg.  04/23/12   [provider]  glucose blood (ONETOUCH ULTRA) test strip 1 each by Other route as needed. 07/30/20   [provider]  HYDROcodone -acetaminophen  (NORCO/VICODIN) 5-325 MG tablet Take 1-2 tablets by mouth every 6 (six) hours as needed for moderate pain. 05/29/15   Zackowski, Scott, MD  levothyroxine (SYNTHROID) 50 MCG tablet Take 25 mcg by mouth daily. 11/01/18   [provider]  lidocaine  (LIDODERM ) 5 % Place 1 patch onto the skin daily. Remove & Discard patch within 12 hours or as directed by MD 12/18/17   Palumbo, April, MD  megestrol  (MEGACE  ES) 625 MG/5ML suspension Take 5 mLs (625 mg total) by mouth daily. 02/28/24   Pasam, Chinita, MD  olmesartan  (BENICAR ) 20 MG tablet Take 20 mg by mouth daily. 12/07/19   [provider]  prochlorperazine  (COMPAZINE ) 10 MG tablet Take 1 tablet (10 mg total) by mouth every 6 (six) hours as needed for nausea or vomiting. 03/20/24   Pickenpack-Cousar, Fannie SAILOR, NP  rosuvastatin  (CRESTOR ) 10 MG tablet Take 10 mg by mouth 3 (three) times a week.     [provider]    Allergies: Shellfish allergy, Sulfa  antibiotics, Elemental sulfur, and Pantoprazole sodium    Review of Systems  Constitutional:  Negative for fever.  Respiratory:  Positive for cough and shortness of breath. Negative for hemoptysis.   Cardiovascular:  Negative for chest pain.  Gastrointestinal:  Negative for abdominal pain and vomiting.    Updated Vital Signs BP (!) 158/95 (BP Location: Right Arm)   Pulse 72   Temp 98.7 F (37.1 C) (Oral)   Resp 20   Ht 5' 5 (1.651 m)   Wt 102.1 kg   SpO2 93%   BMI 37.46 kg/m   Physical Exam Vitals and nursing note reviewed.  Constitutional:      General: She is not in acute distress.    Appearance: She is  well-developed.  HENT:     Head: Normocephalic and atraumatic.  Eyes:     Conjunctiva/sclera: Conjunctivae normal.  Cardiovascular:     Rate and Rhythm: Normal rate and regular rhythm.     Heart sounds: No murmur heard. Pulmonary:     Effort: Pulmonary effort is normal. No respiratory distress.     Breath sounds: Normal breath sounds.  Abdominal:     Palpations: Abdomen is soft.     Tenderness: There is no abdominal tenderness.  Musculoskeletal:        General: No swelling.     Cervical back: Neck supple.     Right lower leg: No tenderness. Edema present.     Left lower leg: No tenderness. Edema present.  Skin:    General: Skin is warm and dry.     Capillary Refill: Capillary refill takes less than 2 seconds.  Neurological:     General: No focal deficit present.     Mental Status: She is alert.     (all labs ordered are listed, but only abnormal results are displayed) Labs Reviewed  BASIC METABOLIC PANEL WITH GFR - Abnormal; Notable for the following components:      Result Value   Sodium 132 (*)    Potassium 6.0 (*)    Glucose, Bld 116 (*)    BUN 29 (*)    Creatinine, Ser 1.76 (*)    GFR, Estimated 28 (*)    All other components within normal limits  CBC WITH DIFFERENTIAL/PLATELET - Abnormal; Notable for the following components:   RBC 3.85 (*)    Hemoglobin 10.2 (*)    HCT 33.1 (*)    RDW 18.1 (*)    Abs Immature Granulocytes 0.19 (*)    All other components within normal limits  PRO BRAIN NATRIURETIC PEPTIDE - Abnormal; Notable for the following components:   Pro Brain Natriuretic Peptide 441.0 (*)    All other components within normal limits  TROPONIN T, HIGH SENSITIVITY - Abnormal; Notable for the following components:   Troponin T High Sensitivity 44 (*)    All other components within normal limits  RESP PANEL BY RT-PCR (RSV, FLU A&B, COVID)  RVPGX2  CULTURE, BLOOD (ROUTINE X 2)  CULTURE, BLOOD (ROUTINE X 2)  I-STAT CG4 LACTIC ACID, ED  I-STAT CG4 LACTIC  ACID, ED  TROPONIN T, HIGH SENSITIVITY    EKG: EKG Interpretation Date/Time:  Tuesday April 04 2024 12:12:05 EST Ventricular Rate:  70 PR Interval:  115 QRS Duration:  80 QT Interval:  351 QTC Calculation: 379 R Axis:   30  Text Interpretation: Sinus rhythm Borderline short PR interval LOW VOLTAGE No significant change since prior 11/25 Confirmed by Towana Sharper [phone]) on 04/04/2024 12:40:53 PM  Radiology: [facility]  Chest Port 1 View Result Date: 04/04/2024 EXAM: 1 VIEW(S) XRAY OF THE CHEST 04/04/2024 12:45:00 PM COMPARISON: 02/06/2024 CLINICAL HISTORY: SOB FINDINGS: LUNGS AND PLEURA: Low lung volumes. Small to moderate right pleural effusion with right basilar opacity. No pneumothorax. HEART AND MEDIASTINUM: Atherosclerotic plaque noted. No acute abnormality of the cardiac and mediastinal silhouettes. BONES AND SOFT TISSUES: Bilateral shoulder degenerative changes. No acute osseous abnormality. IMPRESSION: 1. Low lung volumes, limiting evaluation. 2. Small to moderate right pleural effusion with associated right basilar opacity, likely atelectasis. Electronically signed by: Michaeline Blanch MD 04/04/2024 01:59 PM EST RP Workstation: [id number]     Procedures   Medications Ordered in the ED - No data to display  Clinical Course as of 04/04/24 1618  Tue Apr 04, 2024  1239 Daughter is here with a little more history.  She was diagnosed with lung cancer after they did a thoracentesis for fluid around the lung and found malignant cells.  She was not a candidate for immune therapy and was not felt to be able to be strong enough to undergo chemotherapy.  It sounds like they have mostly been monitoring her.  Been more short of breath for a couple of weeks.  Had an CAT scan and they found a little bit of fluids to try to do a thoracentesis but there was not enough to tap.  Daughter is concerned maybe this is congestive heart failure or something else causing her to be short of breath.  Also has seen  her to have leg swelling.  Shortness of breath is mostly at night where she is gasping for air despite her CPAP. [MB]  1326 Chest x-ray showing probable effusion on right.  Awaiting radiology reading. [MB]  1452 Nurses putting the patient in for an IV consult that she has poor access. [MB]  1508 Received sign out from Dr. Towana pending labs. P/w shortness of breath. Hx of lung cancer  [WS]    Clinical Course User Index [MB] Towana Ozell BROCKS, MD [WS] Francesca Elsie CROME, MD                                 Medical Decision Making Amount and/or Complexity of Data Reviewed Labs: ordered. Radiology: ordered.   This patient complains of shortness of breath general fatigue; this involves an extensive number of treatment Options and is a complaint that carries with it a high risk of complications and morbidity. The differential includes CHF, COPD, pneumonia, dehydration, pleural effusion, PE  I ordered, reviewed and interpreted labs, which included CBC with normal white count chronically low hemoglobin, chemistries elevated potassium chronically elevated BUN and creatinine, troponins mildly elevated, BNP elevated, COVID and flu negative I ordered imaging studies which included chest x-ray and I independently    visualized and interpreted imaging which showed moderate right pleural effusion Additional history obtained from patient's daughter Previous records obtained and reviewed recent oncology notes Cardiac monitoring reviewed, sinus rhythm Social determinants considered, depression Critical Interventions: None  After the interventions stated above, I reevaluated the patient and found patient saturation to be 91% on room air Admission and further testing considered, her care is signed out to Dr. Francesca to follow-up her results of labs and imaging.  Disposition per results of testing.      Final diagnoses:  Dyspnea on exertion  General weakness    ED Discharge Orders     None  Towana Ozell BROCKS, MD 04/04/24 [phone]  "

## 2024-04-04 NOTE — ED Notes (Signed)
 IV team in room

## 2024-04-04 NOTE — ED Triage Notes (Signed)
 Pt bib EMS from home. SOB Hx lung ca and heart failure. 89% RA 94% 2L.

## 2024-04-05 ENCOUNTER — Observation Stay (HOSPITAL_COMMUNITY)

## 2024-04-05 DIAGNOSIS — I5031 Acute diastolic (congestive) heart failure: Secondary | ICD-10-CM | POA: Diagnosis not present

## 2024-04-05 DIAGNOSIS — E871 Hypo-osmolality and hyponatremia: Secondary | ICD-10-CM | POA: Diagnosis not present

## 2024-04-05 DIAGNOSIS — J90 Pleural effusion, not elsewhere classified: Secondary | ICD-10-CM

## 2024-04-05 DIAGNOSIS — E662 Morbid (severe) obesity with alveolar hypoventilation: Secondary | ICD-10-CM | POA: Diagnosis not present

## 2024-04-05 DIAGNOSIS — I5033 Acute on chronic diastolic (congestive) heart failure: Secondary | ICD-10-CM

## 2024-04-05 DIAGNOSIS — J9601 Acute respiratory failure with hypoxia: Secondary | ICD-10-CM | POA: Diagnosis not present

## 2024-04-05 DIAGNOSIS — E875 Hyperkalemia: Secondary | ICD-10-CM

## 2024-04-05 DIAGNOSIS — N184 Chronic kidney disease, stage 4 (severe): Secondary | ICD-10-CM | POA: Diagnosis not present

## 2024-04-05 DIAGNOSIS — C3491 Malignant neoplasm of unspecified part of right bronchus or lung: Secondary | ICD-10-CM | POA: Diagnosis not present

## 2024-04-05 LAB — COMPREHENSIVE METABOLIC PANEL WITH GFR
ALT: 13 U/L (ref 0–44)
AST: 13 U/L — ABNORMAL LOW (ref 15–41)
Albumin: 2.9 g/dL — ABNORMAL LOW (ref 3.5–5.0)
Alkaline Phosphatase: 51 U/L (ref 38–126)
Anion gap: 9 (ref 5–15)
BUN: 29 mg/dL — ABNORMAL HIGH (ref 8–23)
CO2: 24 mmol/L (ref 22–32)
Calcium: 8.8 mg/dL — ABNORMAL LOW (ref 8.9–10.3)
Chloride: 100 mmol/L (ref 98–111)
Creatinine, Ser: 1.76 mg/dL — ABNORMAL HIGH (ref 0.44–1.00)
GFR, Estimated: 28 mL/min — ABNORMAL LOW
Glucose, Bld: 111 mg/dL — ABNORMAL HIGH (ref 70–99)
Potassium: 5.5 mmol/L — ABNORMAL HIGH (ref 3.5–5.1)
Sodium: 132 mmol/L — ABNORMAL LOW (ref 135–145)
Total Bilirubin: 0.2 mg/dL (ref 0.0–1.2)
Total Protein: 6.8 g/dL (ref 6.5–8.1)

## 2024-04-05 LAB — CBC
HCT: 31.7 % — ABNORMAL LOW (ref 36.0–46.0)
Hemoglobin: 10.2 g/dL — ABNORMAL LOW (ref 12.0–15.0)
MCH: 27.2 pg (ref 26.0–34.0)
MCHC: 32.2 g/dL (ref 30.0–36.0)
MCV: 84.5 fL (ref 80.0–100.0)
Platelets: 352 K/uL (ref 150–400)
RBC: 3.75 MIL/uL — ABNORMAL LOW (ref 3.87–5.11)
RDW: 18 % — ABNORMAL HIGH (ref 11.5–15.5)
WBC: 10.3 K/uL (ref 4.0–10.5)
nRBC: 0 % (ref 0.0–0.2)

## 2024-04-05 LAB — ECHOCARDIOGRAM COMPLETE
Area-P 1/2: 3.6 cm2
Calc EF: 71.7 %
Height: 65 in
S' Lateral: 2.3 cm
Single Plane A2C EF: 70.4 %
Single Plane A4C EF: 73.9 %
Weight: 3601.43 [oz_av]

## 2024-04-05 MED ORDER — ASPIRIN 81 MG PO TBEC
81.0000 mg | DELAYED_RELEASE_TABLET | ORAL | Status: DC
  Filled 2024-04-05 (×2): qty 1

## 2024-04-05 MED ORDER — AMLODIPINE BESYLATE 5 MG PO TABS
5.0000 mg | ORAL_TABLET | Freq: Every evening | ORAL | Status: DC
  Administered 2024-04-05 – 2024-04-06 (×2): 5 mg via ORAL
  Filled 2024-04-05 (×2): qty 1

## 2024-04-05 MED ORDER — MEGESTROL ACETATE 625 MG/5ML PO SUSP: 625.0000 mg | Freq: Every day | ORAL | Status: DC

## 2024-04-05 MED ORDER — FUROSEMIDE 10 MG/ML IJ SOLN: 40.0000 mg | Freq: Two times a day (BID) | INTRAMUSCULAR | Status: DC

## 2024-04-05 MED ORDER — ALBUTEROL SULFATE (2.5 MG/3ML) 0.083% IN NEBU: 3.0000 mL | INHALATION_SOLUTION | RESPIRATORY_TRACT | Status: DC | PRN

## 2024-04-05 MED ORDER — LEVOTHYROXINE SODIUM 25 MCG PO TABS
25.0000 ug | ORAL_TABLET | Freq: Every day | ORAL | Status: DC
  Administered 2024-04-05 – 2024-04-07 (×2): 25 ug via ORAL
  Filled 2024-04-05 (×3): qty 1

## 2024-04-05 MED ORDER — FUROSEMIDE 10 MG/ML IJ SOLN
80.0000 mg | Freq: Two times a day (BID) | INTRAMUSCULAR | Status: DC
  Administered 2024-04-05 – 2024-04-06 (×3): 80 mg via INTRAVENOUS
  Filled 2024-04-05 (×3): qty 8

## 2024-04-05 MED ORDER — ROSUVASTATIN CALCIUM 10 MG PO TABS
10.0000 mg | ORAL_TABLET | ORAL | Status: DC
  Administered 2024-04-05: 10 mg via ORAL
  Filled 2024-04-05: qty 1

## 2024-04-05 MED ORDER — IRBESARTAN 150 MG PO TABS: 150.0000 mg | ORAL_TABLET | Freq: Every day | ORAL | Status: DC

## 2024-04-05 NOTE — Progress Notes (Signed)
" °   04/05/24 2312  BiPAP/CPAP/SIPAP  $ Non-Invasive Home Ventilator  Initial  BiPAP/CPAP/SIPAP Pt Type Adult  BiPAP/CPAP/SIPAP Resmed  Mask Type Nasal mask  Dentures removed? Not applicable  Mask Size Medium  FiO2 (%) 21 %  Patient Home Machine No  Patient Home Mask No  Patient Home Tubing No  Auto Titrate Yes  Minimum cmH2O 11 cmH2O  Maximum cmH2O 12 cmH2O  Device Plugged into RED Power Outlet Yes    "
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
## 2024-04-05 NOTE — Progress Notes (Signed)
 " Progress Note   Patient: Vicki [name] FMW:[id number] DOB: [birth year] DOA: 04/04/2024     0 DOS: the patient was seen and examined on 04/05/2024   Brief hospital course: Vicki [name] is a 89 y.o. female with medical history significant of angioedema, osteoarthritis, asthma, bradycardia, stage IV CKD, left breast cancer, CKD, type 2 diabetes, GERD, gout, hypertension, scoliosis, sleep apnea on CPAP, urticaria, stage IV lung adenocarcinoma, chronic diastolic heart failure who presented to the emergency department with complaints of progressively worse dyspnea for the past 2 days.  She has also had orthopnea.  She was hypoxic at 89% when EMS arrived to bring her to the hospital.  Patient apparently has a liberal sodium intake.  She tested negative for COVID influenza, RSV.  proBNP 441, lactic acid normal, chest x-ray showed low lung volumes, small to moderate right pleural effusion, right basilar opacity.  Patient got IV fluid bolus 2-1/2 L in the ED due to low blood pressures, got IV antibiotic therapy.  Patient is admitted to the hospitalist service for acute hypoxic respiratory failure, volume overload, diastolic CHF exacerbation.  Assessment and Plan: Acute respiratory failure with hypoxia Acute on chronic diastolic CHF- Patient will be continued on Lasix  40 mg IV twice daily. Cardiology evaluation appreciated, increased lasix  to 80 mg BID.  Component of stage IV adenocarcinoma with malignant pleural effusion contributing to her shortness of breath. Echocardiogram showed EF 60 to 65%, large liver cyst. DVT study ordered as left > right leg swelling noted and she has h/o cancer, is not ambulating much. Monitor daily weights, strict input and output.  Hyperkalemia: Patient is currently on olmesartan  which is stopped. Patient got Lokelma , current potassium 5.5. Continue IV Lasix  regimen which will help potassium.  Hyponatremia:  In the setting of hypervolemia. Trend sodium with  diuresis.  Recent diagnosis of non-small cell lung cancer- Malignant pleural effusion Patient does not want to seek any further treatment and wished to seek palliative care.  CT chest showed pleural metastatic disease, stable metastatic right pleural effusion.  Will continue IV diuresis for now.  Did see large liver cyst on her echocardiogram, RUQ sono ordered.  Palliative consultation for discharge planning.  Hypertension: Continue Coreg  twice daily. Resumed amlodipine .  Hold olmesartan .  OSA on CPAP at bedtime.  CKD stage IV- Kidney function seems to be baseline. Continue to monitor daily kidney function on diuresis.  Hypothyroidism- Resumed home dose Synthroid.  GERD-continue famotidine.  Obesity class II: BMI 37.47 She is eating poor due to her illness, cancer. RD consult placed.     Out of bed to chair. Incentive spirometry. Nursing supportive care. Fall, aspiration precautions. Diet:  Diet Orders (From admission, onward)     Start     Ordered   04/04/24 1737  Diet Heart Room service appropriate? Yes; Fluid consistency: Thin; Fluid restriction: 1200 mL Fluid  Diet effective now       Question Answer Comment  Room service appropriate? Yes   Fluid consistency: Thin   Fluid restriction: 1200 mL Fluid      04/04/24 1737           DVT prophylaxis: heparin injection 5,000 Units Start: 04/04/24 2200  Level of care: Telemetry   Code Status: Full Code  Subjective: Patient is seen and examined today morning in the ED.  She is lying in bed, feels weak, daughter at bedside.  She did not get out of bed.  Leg swelling persist.  She is off oxygen.  Physical Exam:  Vitals:   04/05/24 1000 04/05/24 1412 04/05/24 1600 04/05/24 1609  BP: (!) 152/74 (!) 155/68 (!) 165/66   Pulse: 74 80    Resp: 18 18 (!) 26   Temp: 98.7 F (37.1 C)   98.6 F (37 C)  TempSrc:    Oral  SpO2: 97% 95% 96%   Weight:      Height:        General - Elderly ill looking African-American  female, no apparent distress HEENT - PERRLA, EOMI, atraumatic head, non tender sinuses. Lung -distant breath sounds, basal rales, rhonchi, no wheezes. Heart - S1, S2 heard, no murmurs, rubs, 1+ pitting pedal edema. Abdomen - Soft, non tender, distended, bowel sounds good Neuro - Alert, awake and oriented, non focal exam. Skin - Warm and dry.  Data Reviewed:      Latest Ref Rng & Units 04/05/2024    5:03 AM 04/04/2024    3:45 PM 02/28/2024    2:02 PM  CBC  WBC 4.0 - 10.5 K/uL 10.3  9.8  9.1   Hemoglobin 12.0 - 15.0 g/dL 89.7  89.7  89.4   Hematocrit 36.0 - 46.0 % 31.7  33.1  32.6   Platelets 150 - 400 K/uL 352  332  297       Latest Ref Rng & Units 04/05/2024    5:03 AM 04/04/2024   10:46 PM 04/04/2024    3:45 PM  BMP  Glucose 70 - 99 mg/dL 888  835  883   BUN 8 - 23 mg/dL 29  30  29    Creatinine 0.44 - 1.00 mg/dL 8.23  8.21  8.23   Sodium 135 - 145 mmol/L 132  133  132   Potassium 3.5 - 5.1 mmol/L 5.5  5.5  6.0   Chloride 98 - 111 mmol/L 100  100  99   CO2 22 - 32 mmol/L 24  24  24    Calcium  8.9 - 10.3 mg/dL 8.8  8.6  8.9    ECHOCARDIOGRAM COMPLETE Result Date: 04/05/2024    ECHOCARDIOGRAM REPORT   Patient Name:   Vicki [name] Date of Exam: 04/05/2024 Medical Rec #:  [id number]       Height:       65.0 in Accession #:    [id number]      Weight:       225.1 lb Date of Birth:  20-Feb-1935       BSA:          2.080 m Patient Age:    89 years        BP:           152/74 mmHg Patient Gender: F               HR:           81 bpm. Exam Location:  Inpatient Procedure: 2D Echo, Cardiac Doppler and Color Doppler (Both Spectral and Color            Flow Doppler were utilized during procedure). Indications:    I50.40* Unspecified combined systolic (congestive) and diastolic                 (congestive) heart failure  History:        Patient has prior history of Echocardiogram examinations, most                 recent 11/19/2022. CHF; Risk Factors:Sleep Apnea, Hypertension,  Dyslipidemia and Diabetes. Lung cancer. Volume overload.  Sonographer:    Ellouise Mose RDCS Referring Phys: [id number] DAVID MANUEL ORTIZ  Sonographer Comments: Technically difficult study due to poor echo windows, patient is obese and suboptimal parasternal window. Image acquisition challenging due to patient body habitus. Definity would have been used, but patient stated she had enough. IMPRESSIONS  1. Probable large liver cyst; suggest dedicated liver ultrasound to better assess.  2. Left ventricular ejection fraction, by estimation, is 70 to 75%. The left ventricle has hyperdynamic function. The left ventricle has no regional wall motion abnormalities. Left ventricular diastolic parameters are consistent with Grade I diastolic dysfunction (impaired relaxation).  3. Right ventricular systolic function is normal. The right ventricular size is normal. There is moderately elevated pulmonary artery systolic pressure. The estimated right ventricular systolic pressure is 48.6 mmHg.  4. The mitral valve is normal in structure. No evidence of mitral valve regurgitation. No evidence of mitral stenosis.  5. The aortic valve is tricuspid. Aortic valve regurgitation is trivial. No aortic stenosis is present.  6. The inferior vena cava is normal in size with greater than 50% respiratory variability, suggesting right atrial pressure of 3 mmHg. FINDINGS  Left Ventricle: Left ventricular ejection fraction, by estimation, is 70 to 75%. The left ventricle has hyperdynamic function. The left ventricle has no regional wall motion abnormalities. The left ventricular internal cavity size was normal in size. There is no left ventricular hypertrophy. Left ventricular diastolic parameters are consistent with Grade I diastolic dysfunction (impaired relaxation). Right Ventricle: The right ventricular size is normal. Right ventricular systolic function is normal. There is moderately elevated pulmonary artery systolic pressure. The tricuspid  regurgitant velocity is 2.90 m/s, and with an assumed right atrial pressure of 15 mmHg, the estimated right ventricular systolic pressure is 48.6 mmHg. Left Atrium: Left atrial size was normal in size. Right Atrium: Right atrial size was normal in size. Pericardium: There is no evidence of pericardial effusion. Mitral Valve: The mitral valve is normal in structure. Mild mitral annular calcification. No evidence of mitral valve regurgitation. No evidence of mitral valve stenosis. Tricuspid Valve: The tricuspid valve is normal in structure. Tricuspid valve regurgitation is trivial. No evidence of tricuspid stenosis. Aortic Valve: The aortic valve is tricuspid. Aortic valve regurgitation is trivial. No aortic stenosis is present. Pulmonic Valve: The pulmonic valve was not well visualized. Pulmonic valve regurgitation is not visualized. No evidence of pulmonic stenosis. Aorta: The aortic root is normal in size and structure. Venous: The inferior vena cava was not well visualized. The inferior vena cava is normal in size with greater than 50% respiratory variability, suggesting right atrial pressure of 3 mmHg. IAS/Shunts: The interatrial septum was not well visualized. Additional Comments: Probable large liver cyst; suggest dedicated liver ultrasound to better assess.  LEFT VENTRICLE PLAX 2D LVIDd:         3.40 cm     Diastology LVIDs:         2.30 cm     LV e' medial:    5.22 cm/s LV PW:         1.10 cm     LV E/e' medial:  8.3 LV IVS:        1.00 cm     LV e' lateral:   6.31 cm/s LVOT diam:     2.10 cm     LV E/e' lateral: 6.9 LV SV:         53 LV SV Index:   25 LVOT Area:  3.46 cm LV IVRT:       106 msec  LV Volumes (MOD) LV vol d, MOD A2C: 39.8 ml LV vol d, MOD A4C: 46.9 ml LV vol s, MOD A2C: 11.8 ml LV vol s, MOD A4C: 12.3 ml LV SV MOD A2C:     28.0 ml LV SV MOD A4C:     46.9 ml LV SV MOD BP:      32.8 ml RIGHT VENTRICLE             IVC RV S prime:     10.70 cm/s  IVC diam: 1.40 cm TAPSE (M-mode): 1.2 cm LEFT  ATRIUM             Index        RIGHT ATRIUM          Index LA diam:        2.80 cm 1.35 cm/m   RA Area:     9.82 cm LA Vol (A2C):   29.8 ml 14.33 ml/m  RA Volume:   17.40 ml 8.37 ml/m LA Vol (A4C):   46.0 ml 22.12 ml/m LA Biplane Vol: 38.4 ml 18.46 ml/m  AORTIC VALVE LVOT Vmax:   89.40 cm/s LVOT Vmean:  58.800 cm/s LVOT VTI:    0.153 m  AORTA Ao Root diam: 3.30 cm Ao Asc diam:  2.80 cm MITRAL VALVE               TRICUSPID VALVE MV Area (PHT): 3.60 cm    TR Peak grad:   33.6 mmHg MV Decel Time: 211 msec    TR Vmax:        290.00 cm/s MV E velocity: 43.50 cm/s MV A velocity: 85.10 cm/s  SHUNTS MV E/A ratio:  0.51        Systemic VTI:  0.15 m                            Systemic Diam: 2.10 cm Redell Shallow MD Electronically signed by Redell Shallow MD Signature Date/Time: 04/05/2024/3:23:29 PM    Final    CT Chest Wo Contrast Result Date: 04/04/2024 EXAM: CT CHEST WITHOUT CONTRAST 04/04/2024 05:01:10 PM TECHNIQUE: CT of the chest was performed without the administration of intravenous contrast. Multiplanar reformatted images are provided for review. Automated exposure control, iterative reconstruction, and/or weight based adjustment of the mA/kV was utilized to reduce the radiation dose to as low as reasonably achievable. COMPARISON: 03/15/2024 CLINICAL HISTORY: Pleural effusion, malignancy suspected. Respiratory illness, nondiagnostic xray. Dyspnea, lung cancer, congestive heart failure. Hypoxia. *tracking code: Bo* FINDINGS: MEDIASTINUM: Global cardiac size within normal limits. No pericardial effusion. Moderate coronary artery calcification. Moderate atherosclerotic calcification within the thoracic aorta. No aortic aneurysm. Central pulmonary arteries are of normal caliber. The central airways are clear. LYMPH NODES: No mediastinal, hilar or axillary lymphadenopathy. LUNGS AND PLEURA: Extensive pleural nodularity is again identified in keeping with pleural metastatic disease, stable since prior  examination. Index nodule within the right major fissure measures 2.4 x 2.8 cm. Pleural-based nodule anteriorly at the right lung base measures 12 mm in maximal diameter. There is, however, better visualization of a hyperdense pleural mass within the basilar right pleural space (series 105, image 2). This is most in keeping with a component of the pleural metastatic disease. Less likely, this may represent a central clot in the setting of a hemothorax. This could be differentiated with contrast-enhanced Mri examination. Moderate right pleural effusion, likely malignant,  is again identified, stable since prior examination. Stable compressive atelectasis of the right lung base. Small bilateral pulmonary nodules are again identified with no significant interval change, most in keeping with intrapulmonary metastatic disease. Index nodule within the atelectatic right lower lobe measures 11 mm. No central obstructing lesion. No pneumothorax. No focal consolidation or pulmonary edema. SOFT TISSUES/BONES: Osseous structures are age appropriate. No acute bone abnormality. No lytic or blastic bone lesion. Postsurgical changes noted within the left breast. Moderate subcutaneous edema noted in the left lateral abdominal wall comparison visualized. UPPER ABDOMEN: Limited images of the upper abdomen demonstrate simple cortical cysts within the visualized upper pole of the kidneys bilaterally measuring at least 10.7 cm on the right and at least 3 cm on the left. No follow up imaging is recommended. IMPRESSION: 1. Stable pleural metastatic disease with moderate right pleural effusion, likely malignant, and stable associated compressive atelectasis at the right lung base. 2. Hyperdense pleural mass in the basilar right pleural space, favored to represent pleural metastatic disease; less likely central clot in the setting of hemothorax, and contrast-enhanced MRI could differentiate. 3. Stable small bilateral pulmonary nodules, most  in keeping with intrapulmonary metastatic disease. 4. Moderate coronary artery calcification. 5. Postsurgical changes in the left breast. 6. Raf score includes aortic atherosclerosis (ICD10-I70.0). Electronically signed by: Dorethia Molt MD 04/04/2024 06:17 PM EST RP Workstation: [id number]   DG Chest Port 1 View Result Date: 04/04/2024 EXAM: 1 VIEW(S) XRAY OF THE CHEST 04/04/2024 12:45:00 PM COMPARISON: 02/06/2024 CLINICAL HISTORY: SOB FINDINGS: LUNGS AND PLEURA: Low lung volumes. Small to moderate right pleural effusion with right basilar opacity. No pneumothorax. HEART AND MEDIASTINUM: Atherosclerotic plaque noted. No acute abnormality of the cardiac and mediastinal silhouettes. BONES AND SOFT TISSUES: Bilateral shoulder degenerative changes. No acute osseous abnormality. IMPRESSION: 1. Low lung volumes, limiting evaluation. 2. Small to moderate right pleural effusion with associated right basilar opacity, likely atelectasis. Electronically signed by: Michaeline Blanch MD 04/04/2024 01:59 PM EST RP Workstation: [id number]    Family Communication: Discussed with patient, daughter at bedside, understand and agree. All questions answered.  Disposition: Status is: Observation The patient will require care spanning > 2 midnights and should be moved to inpatient because: IV Lasix  therapy, follow kidney function, electrolytes, palliative consult  Planned Discharge Destination: Home with Home Health     Time spent: 52 minutes  Author: Concepcion Riser, MD 04/05/2024 4:35 PM Secure chat 7am to 7pm For on call review [email or web address].    "

## 2024-04-05 NOTE — Consult Note (Addendum)
 "  Cardiology Consultation  Patient ID: Vicki [name] MRN: [id number]; DOB: 06-27-1934  Admit date: 04/04/2024 Date of Consult: 04/05/2024  PCP:  Vicki Anes, MD   [facility] Providers Cardiologist:  Vicki Lesches, MD     Patient Profile: Vicki [name] is a 89 y.o. female with a hx of hypertension, chronic diastolic heart failure, OSA on CPAP, CKD stage IV, history of breast cancer, diabetes, GERD, scoliosis, osteoarthritis, asthma, gout, who is being seen 04/05/2024 for the evaluation of suspected exacerbation of CHF at the request of Dr. Darci.  History of Present Illness: [name] has past medical history stated above.  She presented to the [facility] long emergency department on 04/04/2024 complaining of shortness of breath x 1 day.  She does not typically wear oxygen, was hypoxic and placed on nasal cannula via EMS.  She denies any chest pain, palpitations, diaphoresis.  She reports significant sodium intake at baseline.  Relevant workup on the ED includes: proBNP mildly elevated at 441, troponin mildly elevated and flat 44 ? 46, lactic acid normal, metabolic panel showed hyponatremia with sodium 132, hyperkalemic at 6.0 on arrival, creatinine 1.76 on arrival (noted to be 1.57 November 2025), CBC showed chronic anemia with hemoglobin at 10, around baseline.  Respiratory panel negative.  EKG showed sinus rhythm, no significant changes when compared to prior EKGs.  CXR showed small to moderate right pleural effusion with likely atelectasis.  Chest CT showed stable pleural metastatic disease with moderate right pleural effusion, suspected malignancy, hyperdense pleural mass in the right basilar pleural space suspect metastasis, moderate coronary artery calcifications.  Pending updated echocardiogram this admission.  Per chart review, it indicates that patient was recently diagnosed with lung cancer after they did a thoracentesis and found malignant cells.  They noted she  was not a candidate for immunotherapy, and not deemed to be strong enough to undergo chemotherapy.  Due to this she was recently seen by palliative care, 03/20/2024 for symptom management and goals of care.  It appears that the patient as well as her family were realistic and understanding of her extensive stage cancer and disease trajectory.  They have made the decision to forego any treatment options at this time and focus on her quality of life as that is the most important thing to them.  She was admitted to the medicine service, cardiology was asked to consult in the setting of shortness of breath suspected to be CHF.  Echocardiogram from August 2024: LVEF 60 to 65%, normal RV systolic function, mild MR.   She is now followed by Dr. Lesches as an outpatient, last seen in July 2025 for follow-up.  At this appointment she was reporting some dyspnea on exertion, noting this is a chronic problem for her.  Her medication regimen at this time included: Amlodipine  5 mg daily, Crestor  10 mg 3 times weekly, olmesartan  20 mg daily.  At this appointment he noted her to be euvolemic, weighing 250 lbs.  After speaking with the patient, and family that are present in the room, they agree with the history as stated above. Her family tells me that they have met with palliative care and are all in agreement that they are not pursuing aggressive treatment for her lung CA diagnosis however they want to ensure that she is comfortable. She has been more short of breath lately, and unable to lay flat to sleep. They report that she was started on IV Lasix  yesterday and had good urinary response and that  today she has had not as much urine output, she is presently on a purewick. The patient is able to tell me that she feels slightly better, in terms of shortness of breath, compared to yesterday. Echocardiogram was being performed while I was in there.   Past Medical History:  Diagnosis Date   Adenocarcinoma of lung (HCC)  01/25/2024   Angio-edema    Arthritis    osteo   Asthma    none in years   Bradycardia    Breast cancer (HCC) 2010   Left   Chronic kidney disease    states elevated creatnine- followed by Dr Vicki   Complication of anesthesia    following tympanoplasty in 1997- HAD TIGHTENING AROUND CHEST FOLLOWING ANESTHESIA'- states has done well x 2 since   Diabetes mellitus    borderline/diet controlled   GERD (gastroesophageal reflux disease)    Gout    Hypertension    Obesity    Personal history of radiation therapy    Scoliosis    Sleep apnea    states setting on 11- followed by Dr Willeen yearly- last study 7 years ago   Sleep apnea with use of continuous positive airway pressure (CPAP) 01/24/2013   Urticaria    Past Surgical History:  Procedure Laterality Date   BREAST LUMPECTOMY Left 07/2008   Left - Dr Merrilyn   CARPAL TUNNEL RELEASE     right   DILATION AND CURETTAGE OF UTERUS     IR THORACENTESIS ASP PLEURAL SPACE W/IMG GUIDE  01/17/2024   SHOULDER OPEN ROTATOR CUFF REPAIR  05/27/2011   Procedure: ROTATOR CUFF REPAIR SHOULDER OPEN;  Surgeon: Tanda DELENA Heading, MD;  Location: [facility];  Service: Orthopedics;  Laterality: Right;  Right Shoulder Rotator Cuff Repair complex     TONSILLECTOMY     TYMPANOPLASTY  1997   right    Home Medications:  Prior to Admission medications  Medication Sig Start Date End Date Taking? Authorizing Provider  albuterol (VENTOLIN HFA) 108 (90 Base) MCG/ACT inhaler Inhale 2 puffs into the lungs every 4 (four) hours as needed (as needed). 10/27/22  Yes [provider]  allopurinol  (ZYLOPRIM ) 300 MG tablet Take 150 mg by mouth daily. Patient taking differently: Take 150 mg by mouth as needed.   Yes [provider]  amLODipine  (NORVASC ) 5 MG tablet Take 5 mg by mouth daily.  08/06/16  Yes [provider]  aspirin  81 MG tablet Take 81 mg by mouth every other day.   Yes [provider]  carvedilol  (COREG ) 12.5 MG tablet  Take 1 tablet (12.5 mg total) by mouth 2 (two) times daily with a meal. 11/17/23  Yes Court Vicki PARAS, MD  chlorthalidone (HYGROTON) 25 MG tablet Take 12.5 mg by mouth daily.   Yes [provider]  EPIPEN  2-PAK 0.3 MG/0.3ML DEVI 0.3 mg.  04/23/12  Yes [provider]  glucose blood (ONETOUCH ULTRA) test strip 1 each by Other route as needed. 07/30/20  Yes [provider]  HYDROcodone -acetaminophen  (NORCO/VICODIN) 5-325 MG tablet Take 1-2 tablets by mouth every 6 (six) hours as needed for moderate pain. 05/29/15  Yes Zackowski, Scott, MD  levothyroxine (SYNTHROID) 50 MCG tablet Take 25 mcg by mouth daily. 11/01/18  Yes [provider]  olmesartan  (BENICAR ) 20 MG tablet Take 20 mg by mouth daily. 12/07/19  Yes [provider]  prochlorperazine  (COMPAZINE ) 10 MG tablet Take 1 tablet (10 mg total) by mouth every 6 (six) hours as needed for nausea or  vomiting. 03/20/24  Yes Pickenpack-Cousar, Fannie SAILOR, NP  rosuvastatin  (CRESTOR ) 10 MG tablet Take 10 mg by mouth once a week. Wednesdays   Yes [provider]  Cholecalciferol (VITAMIN D ) 1000 UNITS capsule Take 1,000 Units by mouth daily. Patient not taking: Reported on 04/04/2024    [provider]  Coenzyme Q10 (CO Q 10 PO) Take 1 tablet by mouth daily.  Patient not taking: Reported on 04/04/2024    [provider]  lidocaine  (LIDODERM ) 5 % Place 1 patch onto the skin daily. Remove & Discard patch within 12 hours or as directed by MD Patient not taking: Reported on 04/04/2024 12/18/17   Palumbo, April, MD  megestrol  (MEGACE  ES) 625 MG/5ML suspension Take 5 mLs (625 mg total) by mouth daily. Patient not taking: Reported on 04/04/2024 02/28/24   Autumn Millman, MD   Scheduled Meds:  allopurinol   150 mg Oral Daily   amLODipine   5 mg Oral QPM   [START ON 04/06/2024] aspirin  EC  81 mg Oral QODAY   carvedilol   12.5 mg Oral BID WC   furosemide   40 mg Intravenous BID   heparin  5,000 Units  Subcutaneous Q8H   levothyroxine  25 mcg Oral Daily   rosuvastatin   10 mg Oral Weekly   Continuous Infusions:  PRN Meds: acetaminophen  **OR** acetaminophen , albuterol, ondansetron  **OR** ondansetron  (ZOFRAN ) IV  Allergies:   Allergies[1]  Social History:   Social History   Socioeconomic History   Marital status: Married    Spouse name: Not on file   Number of children: Not on file   Years of education: Not on file   Highest education level: Not on file  Occupational History   Not on file  Tobacco Use   Smoking status: Former    Current packs/day: 0.00    Types: Cigarettes    Quit date: 04/06/1961    Years since quitting: 63.0   Smokeless tobacco: Never  Vaping Use   Vaping status: Never Used  Substance and Sexual Activity   Alcohol use: No   Drug use: No   Sexual activity: Not Currently    Birth control/protection: Post-menopausal    Comment: 1st intercourse 54 yo-5 partners  Other Topics Concern   Not on file  Social History Narrative   Right handed, Married, 6 kids, Caffeine 1 cup daily, Retired,  [employer] grad,    Social Drivers of Health   Tobacco Use: Medium Risk (04/04/2024)   Patient History    Smoking Tobacco Use: Former    Smokeless Tobacco Use: Never    Passive Exposure: Not on Actuary Strain: Not on file  Food Insecurity: No Food Insecurity (01/25/2024)   Epic    Worried About Programme Researcher, Broadcasting/film/video in the Last Year: Never true    Ran Out of Food in the Last Year: Never true  Transportation Needs: No Transportation Needs (01/25/2024)   Epic    Lack of Transportation (Medical): No    Lack of Transportation (Non-Medical): No  Physical Activity: Not on file  Stress: Not on file  Social Connections: Not on file  Intimate Partner Violence: Not At Risk (01/25/2024)   Epic    Fear of Current or Ex-Partner: No    Emotionally Abused: No    Physically Abused: No    Sexually Abused: No  Depression (PHQ2-9): High Risk (01/25/2024)   Depression  (PHQ2-9)    PHQ-2 Score: 13  Alcohol Screen: Not on file  Housing: Low Risk (01/25/2024)  Epic    Unable to Pay for Housing in the Last Year: No    Number of Times Moved in the Last Year: 0    Homeless in the Last Year: No  Utilities: Not At Risk (01/25/2024)   Epic    Threatened with loss of utilities: No  Health Literacy: Not on file    Family History:   Family History  Problem Relation Age of Onset   Diabetes Mother    Breast cancer Sister        Age unknown   Diabetes Sister    Diabetes Brother    Breast cancer Maternal Grandmother        Age unknown    ROS:  Please see the history of present illness.  All other ROS reviewed and negative.     Physical Exam/Data: Vitals:   04/05/24 0700 04/05/24 0718 04/05/24 1000 04/05/24 1412  BP: (!) 163/75 (!) 163/75 (!) 152/74 (!) 155/68  Pulse:  80 74 80  Resp:  17 18 18   Temp:  98.6 F (37 C) 98.7 F (37.1 C)   TempSrc:      SpO2:  97% 97% 95%  Weight:      Height:       Intake/Output Summary (Last 24 hours) at 04/05/2024 1505 Last data filed at 04/05/2024 0506 Gross per 24 hour  Intake --  Output 800 ml  Net -800 ml      04/04/2024   12:16 PM 02/28/2024    2:34 PM 02/06/2024    1:06 PM  Last 3 Weights  Weight (lbs) 225 lb 1.4 oz -- 225 lb  Weight (kg) 102.1 kg -- 102.059 kg     Body mass index is 37.46 kg/m.   General:   in no acute distress, resting, family present HEENT: normal Neck: unable to assess JVD Vascular:  Distal pulses 2+ bilaterally Cardiac:  normal S1, S2; RRR; no murmur Lungs:  distant breath sounds   Abd: soft, nontender, no hepatomegaly  Ext: 3+ bilateral pitting LE edema Musculoskeletal:  No deformities, BUE and BLE strength normal and equal Skin: warm and dry  Neuro:  no focal abnormalities noted Psych:  Normal affect   EKG:  The EKG was personally reviewed and demonstrates:  sinus rhythm, HR 70, no significant changes when compared to prior tracings   Telemetry:  Telemetry was  personally reviewed and demonstrates:  NSR  Relevant CV Studies:  Echocardiogram, 04/05/2024 Ordered, pending results  Echocardiogram, 11/19/2022 Left ventricular ejection fraction, by estimation, is 60 to 65% . The left ventricle has normal function. The left ventricle has no regional wall motion abnormalities. Left ventricular diastolic parameters were normal. The average left ventricular global longitudinal strain is - 18. 2 % . The global longitudinal strain is normal.  Right ventricular systolic function is normal. The right ventricular size is normal. There is normal pulmonary artery systolic pressure.  Mild mitral valve regurgitation.  The aortic valve is tricuspid. Aortic valve regurgitation is not visualized.  Laboratory Data: High Sensitivity Troponin:  No results for input(s): TROPONINIHS in the last 720 hours.  Recent Labs  Lab 04/04/24 1545 04/04/24 2102  TRNPT 44* 46*      Chemistry Recent Labs  Lab 04/04/24 1545 04/04/24 2246 04/05/24 0503  NA 132* 133* 132*  K 6.0* 5.5* 5.5*  CL 99 100 100  CO2 24 24 24   GLUCOSE 116* 164* 111*  BUN 29* 30* 29*  CREATININE 1.76* 1.78* 1.76*  CALCIUM  8.9 8.6* 8.8*  GFRNONAA 28* 27* 28*  ANIONGAP 9 8 9     Recent Labs  Lab 04/04/24 1545 04/05/24 0503  PROT 7.0 6.8  ALBUMIN 3.2* 2.9*  AST 16 13*  ALT 14 13  ALKPHOS 52 51  BILITOT <0.2 0.2   Lipids No results for input(s): CHOL, TRIG, HDL, LABVLDL, LDLCALC, CHOLHDL in the last 168 hours.  Hematology Recent Labs  Lab 04/04/24 1545 04/05/24 0503  WBC 9.8 10.3  RBC 3.85* 3.75*  HGB 10.2* 10.2*  HCT 33.1* 31.7*  MCV 86.0 84.5  MCH 26.5 27.2  MCHC 30.8 32.2  RDW 18.1* 18.0*  PLT 332 352   Thyroid No results for input(s): TSH, FREET4 in the last 168 hours.  BNP Recent Labs  Lab 04/04/24 1545  PROBNP 441.0*    DDimer No results for input(s): DDIMER in the last 168 hours.  Radiology/Studies:  CT Chest Wo Contrast Result Date:  04/04/2024 EXAM: CT CHEST WITHOUT CONTRAST 04/04/2024 05:01:10 PM TECHNIQUE: CT of the chest was performed without the administration of intravenous contrast. Multiplanar reformatted images are provided for review. Automated exposure control, iterative reconstruction, and/or weight based adjustment of the mA/kV was utilized to reduce the radiation dose to as low as reasonably achievable. COMPARISON: 03/15/2024 CLINICAL HISTORY: Pleural effusion, malignancy suspected. Respiratory illness, nondiagnostic xray. Dyspnea, lung cancer, congestive heart failure. Hypoxia. *tracking code: Bo* FINDINGS: MEDIASTINUM: Global cardiac size within normal limits. No pericardial effusion. Moderate coronary artery calcification. Moderate atherosclerotic calcification within the thoracic aorta. No aortic aneurysm. Central pulmonary arteries are of normal caliber. The central airways are clear. LYMPH NODES: No mediastinal, hilar or axillary lymphadenopathy. LUNGS AND PLEURA: Extensive pleural nodularity is again identified in keeping with pleural metastatic disease, stable since prior examination. Index nodule within the right major fissure measures 2.4 x 2.8 cm. Pleural-based nodule anteriorly at the right lung base measures 12 mm in maximal diameter. There is, however, better visualization of a hyperdense pleural mass within the basilar right pleural space (series 105, image 2). This is most in keeping with a component of the pleural metastatic disease. Less likely, this may represent a central clot in the setting of a hemothorax. This could be differentiated with contrast-enhanced Mri examination. Moderate right pleural effusion, likely malignant, is again identified, stable since prior examination. Stable compressive atelectasis of the right lung base. Small bilateral pulmonary nodules are again identified with no significant interval change, most in keeping with intrapulmonary metastatic disease. Index nodule within the  atelectatic right lower lobe measures 11 mm. No central obstructing lesion. No pneumothorax. No focal consolidation or pulmonary edema. SOFT TISSUES/BONES: Osseous structures are age appropriate. No acute bone abnormality. No lytic or blastic bone lesion. Postsurgical changes noted within the left breast. Moderate subcutaneous edema noted in the left lateral abdominal wall comparison visualized. UPPER ABDOMEN: Limited images of the upper abdomen demonstrate simple cortical cysts within the visualized upper pole of the kidneys bilaterally measuring at least 10.7 cm on the right and at least 3 cm on the left. No follow up imaging is recommended. IMPRESSION: 1. Stable pleural metastatic disease with moderate right pleural effusion, likely malignant, and stable associated compressive atelectasis at the right lung base. 2. Hyperdense pleural mass in the basilar right pleural space, favored to represent pleural metastatic disease; less likely central clot in the setting of hemothorax, and contrast-enhanced MRI could differentiate. 3. Stable small bilateral pulmonary nodules, most in keeping with intrapulmonary metastatic disease. 4. Moderate coronary artery calcification. 5. Postsurgical changes in the left breast. 6.  Raf score includes aortic atherosclerosis (ICD10-I70.0). Electronically signed by: Dorethia Molt MD 04/04/2024 06:17 PM EST RP Workstation: [id number]   DG Chest Port 1 View Result Date: 04/04/2024 EXAM: 1 VIEW(S) XRAY OF THE CHEST 04/04/2024 12:45:00 PM COMPARISON: 02/06/2024 CLINICAL HISTORY: SOB FINDINGS: LUNGS AND PLEURA: Low lung volumes. Small to moderate right pleural effusion with right basilar opacity. No pneumothorax. HEART AND MEDIASTINUM: Atherosclerotic plaque noted. No acute abnormality of the cardiac and mediastinal silhouettes. BONES AND SOFT TISSUES: Bilateral shoulder degenerative changes. No acute osseous abnormality. IMPRESSION: 1. Low lung volumes, limiting evaluation. 2. Small to  moderate right pleural effusion with associated right basilar opacity, likely atelectasis. Electronically signed by: Michaeline Blanch MD 04/04/2024 01:59 PM EST RP Workstation: [id number]   Assessment and Plan:  Volume overload Shortness of breath Chronic HFpEF History of breath cancer, recent diagnosis of lung cancer with malignant pleural effusion  Presented with DOE, orthopnea  LVEF 60-65% 11/2022  proBNP mildly elevated Recently diagnosed with malignant pleural effusion, lung cancer  Decision has been made to not pursue treatment instead focusing on making patient comfortable  Started on IV Lasix  this admission --- reports good urine response yesterday with slowing of output today  Patient remains with significant LE edema Suspect shortness of breath is multifactorial with new diagnosis of lung cancer with malignant pleural effusion, as well as underlying CHF  Currently on IV Lasix  40 mg BID -- okay to continue, monitor response and adjust as needed, may require increased dose for good urinary response given renal function  Previously on losartan , held in the setting of hyperkalemia Chest CT this admission showed moderate right pleural effusion, may need to consider thoracentesis but suspect it wont be necessary as it was evaluated just last week and turned down Echocardiogram being updated at this time, will follow results   Per primary History of breast cancer Lung cancer, malignant pleural effusion  Diabetes Hyperlipidemia Hypertension  Hypothyroidism Electrolyte disturbances CKD stage IV GERD OSA on CPAP Goals of care   Risk Assessment/Risk Scores:      [place]  Heart Association (NYHA) Functional Class NYHA Class III  For questions or updates, please contact [facility] Please consult [email or web address] for contact info under   Signed, Waddell [name], PA-C  04/05/2024 3:05 PM  Personally seen and examined. Agree with above.  89 year old with prior breast  cancer with stage IV lung adenocarcinoma with malignant pleural effusion pleural effusion moderate coronary artery calcification (personally reviewed and interpreted on CT) seen by palliative care on 03/20/2024 for symptom management and goals of care.  Family very supportive and understanding of extensive stage cancer and disease trajectory and made the decision to forego any treatment options.  Focusing on quality of time left.  In the room, she is slightly increased work of breathing but more comfortable than arrival she states.  PureWick.  Fairly reasonable IV Lasix  response.  May not be unreasonable to increase dosing to 80 mg twice daily given her underlying chronic kidney disease stage IV.  I will go ahead and make the change.  Echocardiogram shows ejection fraction of 70%, no significant valvular abnormalities.  The liver lesion possible cyst was noted.  Creatinine 1.76, potassium 5.5, albumin 2.9 EKG with no significant ST segment changes.  Acute on chronic diastolic heart failure Stage IV adenocarcinoma with malignant pleural effusion Stage IV chronic kidney disease  -I have increased the Lasix  to 80 mg IV twice daily  If additional blood pressure is needed, we can always discontinue amlodipine   5 mg  Her pleural effusion and underlying lung malignancy is also playing a role in her shortness of breath.  Once again we are trying for symptom relief, comfort.  Oneil Parchment, MD        [1]  Allergies Allergen Reactions   Shellfish Allergy Swelling    Other Reaction(s): reaction 1/14 suspected it was from shellfish, Unknown   Sulfa Antibiotics Hives    Other Reaction(s): Unknown   Acetaminophen      Other Reaction(s): causes chest tightness   Levofloxacin     Other Reaction(s): trouble breathing   Metoprolol     Other Reaction(s): Unknown   Nitrofurantoin     Other Reaction(s): dizziness   Simvastatin     Other Reaction(s): myalgia   Sulfamethoxazole-Trimethoprim      Other Reaction(s): Unknown   Tramadol     Other Reaction(s): sharp pains in head   Trazodone     Other Reaction(s): ? headache, did not like how it made her feel.   Elemental Sulfur Swelling and Rash   Pantoprazole Sodium Palpitations   "

## 2024-04-05 NOTE — ED Notes (Signed)
Patient given coffee as requested

## 2024-04-05 NOTE — Progress Notes (Signed)
 PT Cancellation Note  Patient Details Name: Vicki [name] MRN: [id number] DOB: [birth year]   Cancelled Treatment:    Reason Eval/Treat Not Completed: Other (comment). Per OT pt not wanted to be bothered by OT/PT today. Will continue efforts to complete PT eval as schedule allows    [facility] 04/05/2024, 3:25 PM

## 2024-04-05 NOTE — Progress Notes (Signed)
" °  Echocardiogram 2D Echocardiogram has been performed.  Vicki [name] 04/05/2024, 3:16 PM "

## 2024-04-05 NOTE — ED Notes (Signed)
 Pt repositioned and boosted in bed per request, states more comfortable at this time.

## 2024-04-06 ENCOUNTER — Observation Stay (HOSPITAL_COMMUNITY)

## 2024-04-06 DIAGNOSIS — E871 Hypo-osmolality and hyponatremia: Secondary | ICD-10-CM | POA: Diagnosis present

## 2024-04-06 DIAGNOSIS — J910 Malignant pleural effusion: Secondary | ICD-10-CM | POA: Diagnosis present

## 2024-04-06 DIAGNOSIS — M7989 Other specified soft tissue disorders: Secondary | ICD-10-CM

## 2024-04-06 DIAGNOSIS — D649 Anemia, unspecified: Secondary | ICD-10-CM | POA: Diagnosis present

## 2024-04-06 DIAGNOSIS — Z7189 Other specified counseling: Secondary | ICD-10-CM

## 2024-04-06 DIAGNOSIS — Z1152 Encounter for screening for COVID-19: Secondary | ICD-10-CM | POA: Diagnosis not present

## 2024-04-06 DIAGNOSIS — C782 Secondary malignant neoplasm of pleura: Secondary | ICD-10-CM | POA: Diagnosis present

## 2024-04-06 DIAGNOSIS — I5031 Acute diastolic (congestive) heart failure: Secondary | ICD-10-CM | POA: Diagnosis not present

## 2024-04-06 DIAGNOSIS — K7689 Other specified diseases of liver: Secondary | ICD-10-CM | POA: Diagnosis present

## 2024-04-06 DIAGNOSIS — E441 Mild protein-calorie malnutrition: Secondary | ICD-10-CM | POA: Diagnosis present

## 2024-04-06 DIAGNOSIS — R0609 Other forms of dyspnea: Secondary | ICD-10-CM | POA: Diagnosis present

## 2024-04-06 DIAGNOSIS — Z515 Encounter for palliative care: Secondary | ICD-10-CM

## 2024-04-06 DIAGNOSIS — J4520 Mild intermittent asthma, uncomplicated: Secondary | ICD-10-CM | POA: Diagnosis present

## 2024-04-06 DIAGNOSIS — I130 Hypertensive heart and chronic kidney disease with heart failure and stage 1 through stage 4 chronic kidney disease, or unspecified chronic kidney disease: Secondary | ICD-10-CM | POA: Diagnosis present

## 2024-04-06 DIAGNOSIS — M109 Gout, unspecified: Secondary | ICD-10-CM | POA: Diagnosis present

## 2024-04-06 DIAGNOSIS — E662 Morbid (severe) obesity with alveolar hypoventilation: Secondary | ICD-10-CM | POA: Diagnosis present

## 2024-04-06 DIAGNOSIS — J9601 Acute respiratory failure with hypoxia: Secondary | ICD-10-CM | POA: Diagnosis present

## 2024-04-06 DIAGNOSIS — E039 Hypothyroidism, unspecified: Secondary | ICD-10-CM | POA: Diagnosis present

## 2024-04-06 DIAGNOSIS — N184 Chronic kidney disease, stage 4 (severe): Secondary | ICD-10-CM | POA: Diagnosis present

## 2024-04-06 DIAGNOSIS — Z66 Do not resuscitate: Secondary | ICD-10-CM | POA: Diagnosis present

## 2024-04-06 DIAGNOSIS — C3491 Malignant neoplasm of unspecified part of right bronchus or lung: Secondary | ICD-10-CM | POA: Diagnosis present

## 2024-04-06 DIAGNOSIS — E785 Hyperlipidemia, unspecified: Secondary | ICD-10-CM | POA: Diagnosis present

## 2024-04-06 DIAGNOSIS — E66812 Obesity, class 2: Secondary | ICD-10-CM | POA: Diagnosis present

## 2024-04-06 DIAGNOSIS — I5033 Acute on chronic diastolic (congestive) heart failure: Secondary | ICD-10-CM | POA: Diagnosis present

## 2024-04-06 DIAGNOSIS — E875 Hyperkalemia: Secondary | ICD-10-CM | POA: Diagnosis present

## 2024-04-06 DIAGNOSIS — J9811 Atelectasis: Secondary | ICD-10-CM | POA: Diagnosis present

## 2024-04-06 DIAGNOSIS — E1122 Type 2 diabetes mellitus with diabetic chronic kidney disease: Secondary | ICD-10-CM | POA: Diagnosis present

## 2024-04-06 DIAGNOSIS — M419 Scoliosis, unspecified: Secondary | ICD-10-CM | POA: Diagnosis present

## 2024-04-06 LAB — HEMOGLOBIN A1C
Hgb A1c MFr Bld: 6.8 % — ABNORMAL HIGH (ref 4.8–5.6)
Mean Plasma Glucose: 148.46 mg/dL

## 2024-04-06 MED ORDER — MEGESTROL ACETATE 400 MG/10ML PO SUSP
800.0000 mg | Freq: Every day | ORAL | Status: DC
  Filled 2024-04-06 (×2): qty 20

## 2024-04-06 NOTE — Progress Notes (Signed)
"  °  Progress Note  Patient Name: Vicki [name] Date of Encounter: 04/06/2024 [facility] Cardiologist: Dorn Lesches, MD  Interval Summary   Resting comfortably, CPAP  Vital Signs Vitals:   04/05/24 2250 04/06/24 0440 04/06/24 0442 04/06/24 0852  BP: (!) 166/84 (!) 175/72  (!) 175/72  Pulse: 77 76  85  Resp: (!) 22 (!) 27  (!) 25  Temp:   98 F (36.7 C) 98.6 F (37 C)  TempSrc:   Oral Oral  SpO2: 97% 94%  93%  Weight:      Height:       No intake or output data in the 24 hours ending 04/06/24 1033    04/04/2024   12:16 PM 02/28/2024    2:34 PM 02/06/2024    1:06 PM  Last 3 Weights  Weight (lbs) 225 lb 1.4 oz -- 225 lb  Weight (kg) 102.1 kg -- 102.059 kg      Telemetry/ECG  Sinus rhythm- Personally Reviewed  Physical Exam  GEN: No acute distress.  CPAP mask on Neck: No JVD Cardiac: RRR, no murmurs, rubs, or gallops.  Respiratory: Blunted bases bilaterally. GI: Soft, nontender, non-distended  MS: No edema  Assessment & Plan   Acute on chronic diastolic heart failure Acute respiratory failure with hypoxia Stage IV adenocarcinoma with malignant pleural effusion - Increased Lasix  yesterday to 80 mg IV twice daily.  I think is reasonable to continue with this.  Monitoring creatinine.  Comfort goals.  Hyperkalemia - IV Lasix  should help with this as well. - Off of olmesartan   Hypothyroidism - 25 mcg of levothyroxine.  Daughter on phone noted that she may have missed her medication both days.  CKD4 - Creatinine 1.78  Palliative care.   For questions or updates, please contact [facility] Please consult [email or web address] for contact info under         Signed, Oneil Parchment, MD   "

## 2024-04-06 NOTE — Consult Note (Signed)
 " Consultation Note Date: 04/06/2024   Patient Name: Vicki [name]  DOB: 1934/09/08  MRN: [id number]  Age / Sex: 89 y.o., female   PCP: Shayne Anes, MD Referring Physician: Raenelle Coria, MD  Reason for Consultation: Establishing goals of care     Chief Complaint/History of Present Illness:   Patient is an 89 year old female with a past medical history of chronic asthma, stage IV CKD, breast cancer, type 2 diabetes mellitus, hypertension, sleep apnea on CPAP, HFpEF, and recently diagnosed stage IV lung adenocarcinoma who was admitted on 04/04/2024 for management of worsening shortness of breath for 2 days prior to admission.  Was also reported to have orthopnea and hypoxemia at 89% at home.  Since admission, patient received management for acute hypoxic respiratory failure in the setting of acute on chronic diastolic congestive heart failure, metastatic adenocarcinoma with malignant pleural effusions, and underlying sleep apnea.  Cardiology consulted for recommendations.  Palliative medicine team consulted to assist with complex medical decision making. Of note patient seen by outpatient PMT provider at [facility] on 03/20/2024.  Reviewed EMR including recent documentation from hospitalist and cardiologist.  Patient had already decided prior to admission that she does not want to undergo cancer directed therapies and wanted to focus on symptom management to enjoy time with family and her quality of life.  Outpatient PMT provider assisting with symptom management.  Cardiology has seen the patient and increased Lasix  to assist with acute on chronic HFpEF.  Reviewed ACP documentation present on file in EMR as well as outpatient providers PMT note.  Daughter, Vicki [name], listed as patient's HCPOA.  Personally reviewed patient's recent chest CT noting stable pleural metastatic disease with moderate right pleural effusion; hyperdense pleural mass favored to represent pleural metastatic disease; bilateral  pulmonary nodules most keeping with intrapulmonary metastatic disease.  Presented to bedside to see patient in the ER.  Patient laying comfortably in bed on CPAP.  Multiple family members present at bedside including patient's husband, son, and daughter-in-law.  Patient's son placed patient's daughter, Vicki, on speaker phone to participate in conversation.  Introduced myself as a member of the palliative medicine team and a colleague of the outpatient PMT provider they had previously seen.  Spent time learning about medical updates up into this point.  Patient's shortness of breath has improved with Lasix  management.  Discussed care planning moving forward.  Family agreed patient does not want to undergo cancer directed therapies.  They were wanting to focus on quality time with patient at home with her symptoms managed appropriately.  Acknowledged this.  Introduced the idea of hospice philosophy and the generalities about support from them in the home setting.  Discussed to focus would be on patient's symptom management and for patient to remain at home and not have to come back to the hospital.  Family agreeing they would want hospice referral to assist in supporting care at home.  Family specifically requesting [facility] as they have known people to receive support through them.  Acknowledged this and noted would reach out to [facility] liaison.  Also discussed medical care at this time.  Wanting to continue with appropriate medical interventions such as Lasix  and lab work to hopefully improve patient's management of acute CHF.  Discussed patient can continue on oral Lasix  at home even with hospice to help with symptom management once appropriate for medical discharge. Also discussed patient's CODE STATUS.  Patient and family had determined that patient to remain full code as family wants time for all  family members to come to bedside even if patient has died and is being kept alive on life support to be  able to say goodbye, would then focus on comfort once all family present.  Took time to express concern that if patient were sick enough her heart were to stop or she were to stop breathing, chest compressions and intubation with mechanical ventilation could cause suffering and would not provide meaningful time where the patient could interact with family.  Recommended consideration of change of CODE STATUS to DNR/DNI, especially since family wants patient to remain in her home.  They will further discuss this.  All questions answered at that time.  Noted palliative medicine team will continue to follow with patient's medical journey.  Discussed care with hospitalist, RN, and [facility] liaison to coordinate care.  Primary Diagnoses  Present on Admission:  Hypertension  Obesity hypoventilation syndrome (HCC)  Adenocarcinoma of lung (HCC)  CKD (chronic kidney disease) stage 4, GFR 15-29 ml/min (HCC)  Gastro-esophageal reflux disease without esophagitis  Hyperlipidemia  Hypothyroidism  Acute respiratory failure with hypoxia (HCC)  Mild protein malnutrition  Hyperkalemia  Hyponatremia  Volume overload    Past Medical History:  Diagnosis Date   Adenocarcinoma of lung (HCC) 01/25/2024   Angio-edema    Arthritis    osteo   Asthma    none in years   Bradycardia    Breast cancer (HCC) 2010   Left   Chronic kidney disease    states elevated creatnine- followed by Dr Shayne   Complication of anesthesia    following tympanoplasty in 1997- HAD TIGHTENING AROUND CHEST FOLLOWING ANESTHESIA'- states has done well x 2 since   Diabetes mellitus    borderline/diet controlled   GERD (gastroesophageal reflux disease)    Gout    Hypertension    Obesity    Personal history of radiation therapy    Scoliosis    Sleep apnea    states setting on 11- followed by Dr Willeen yearly- last study 7 years ago   Sleep apnea with use of continuous positive airway pressure (CPAP) 01/24/2013   Urticaria     Social History   Socioeconomic History   Marital status: Married    Spouse name: Not on file   Number of children: Not on file   Years of education: Not on file   Highest education level: Not on file  Occupational History   Not on file  Tobacco Use   Smoking status: Former    Current packs/day: 0.00    Types: Cigarettes    Quit date: 04/06/1961    Years since quitting: 63.0   Smokeless tobacco: Never  Vaping Use   Vaping status: Never Used  Substance and Sexual Activity   Alcohol use: No   Drug use: No   Sexual activity: Not Currently    Birth control/protection: Post-menopausal    Comment: 1st intercourse 43 yo-5 partners  Other Topics Concern   Not on file  Social History Narrative   Right handed, Married, 6 kids, Caffeine 1 cup daily, Retired,  [employer] grad,    Social Drivers of Health   Tobacco Use: Medium Risk (04/04/2024)   Patient History    Smoking Tobacco Use: Former    Smokeless Tobacco Use: Never    Passive Exposure: Not on Actuary Strain: Not on file  Food Insecurity: No Food Insecurity (01/25/2024)   Epic    Worried About Radiation Protection Practitioner of Food in the Last Year: Never true  Ran Out of Food in the Last Year: Never true  Transportation Needs: No Transportation Needs (01/25/2024)   Epic    Lack of Transportation (Medical): No    Lack of Transportation (Non-Medical): No  Physical Activity: Not on file  Stress: Not on file  Social Connections: Not on file  Depression (PHQ2-9): High Risk (01/25/2024)   Depression (PHQ2-9)    PHQ-2 Score: 13  Alcohol Screen: Not on file  Housing: Low Risk (01/25/2024)   Epic    Unable to Pay for Housing in the Last Year: No    Number of Times Moved in the Last Year: 0    Homeless in the Last Year: No  Utilities: Not At Risk (01/25/2024)   Epic    Threatened with loss of utilities: No  Health Literacy: Not on file   Family History  Problem Relation Age of Onset   Diabetes Mother    Breast cancer  Sister        Age unknown   Diabetes Sister    Diabetes Brother    Breast cancer Maternal Grandmother        Age unknown   Scheduled Meds:  allopurinol   150 mg Oral Daily   amLODipine   5 mg Oral QPM   aspirin  EC  81 mg Oral QODAY   carvedilol   12.5 mg Oral BID WC   furosemide   80 mg Intravenous BID   heparin  5,000 Units Subcutaneous Q8H   levothyroxine  25 mcg Oral Daily   rosuvastatin   10 mg Oral Weekly   Continuous Infusions: PRN Meds:.acetaminophen  **OR** acetaminophen , albuterol, ondansetron  **OR** ondansetron  (ZOFRAN ) IV Allergies[1] CBC:    Component Value Date/Time   WBC 10.3 04/05/2024 0503   HGB 10.2 (L) 04/05/2024 0503   HGB 10.5 (L) 02/28/2024 1402   HGB 11.8 07/13/2013 1017   HCT 31.7 (L) 04/05/2024 0503   HCT 36.1 07/13/2013 1017   PLT 352 04/05/2024 0503   PLT 297 02/28/2024 1402   PLT 254 07/13/2013 1017   MCV 84.5 04/05/2024 0503   MCV 81.9 07/13/2013 1017   NEUTROABS 7.3 04/04/2024 1545   NEUTROABS 5.2 07/13/2013 1017   LYMPHSABS 1.4 04/04/2024 1545   LYMPHSABS 1.9 07/13/2013 1017   MONOABS 0.8 04/04/2024 1545   MONOABS 0.8 07/13/2013 1017   EOSABS 0.1 04/04/2024 1545   EOSABS 0.1 07/13/2013 1017   BASOSABS 0.0 04/04/2024 1545   BASOSABS 0.0 07/13/2013 1017   Comprehensive Metabolic Panel:    Component Value Date/Time   NA 132 (L) 04/05/2024 0503   NA 140 07/06/2013 0900   K 5.5 (H) 04/05/2024 0503   K 3.9 07/06/2013 0900   CL 100 04/05/2024 0503   CL 106 07/05/2012 1101   CO2 24 04/05/2024 0503   CO2 20 (L) 07/06/2013 0900   BUN 29 (H) 04/05/2024 0503   BUN 21.2 07/06/2013 0900   CREATININE 1.76 (H) 04/05/2024 0503   CREATININE 1.57 (H) 02/28/2024 1402   CREATININE 1.4 (H) 07/06/2013 0900   GLUCOSE 111 (H) 04/05/2024 0503   GLUCOSE 170 (H) 07/06/2013 0900   GLUCOSE 182 (H) 07/05/2012 1101   CALCIUM  8.8 (L) 04/05/2024 0503   CALCIUM  9.4 07/06/2013 0900   AST 13 (L) 04/05/2024 0503   AST 16 02/28/2024 1402   AST 14 07/06/2013 0900    ALT 13 04/05/2024 0503   ALT 27 02/28/2024 1402   ALT 13 07/06/2013 0900   ALKPHOS 51 04/05/2024 0503   ALKPHOS 102 07/06/2013 0900   BILITOT 0.2  04/05/2024 0503   BILITOT 0.2 02/28/2024 1402   BILITOT 0.37 07/06/2013 0900   PROT 6.8 04/05/2024 0503   PROT 7.3 07/06/2013 0900   ALBUMIN 2.9 (L) 04/05/2024 0503   ALBUMIN 3.6 07/06/2013 0900    Physical Exam: Vital Signs: BP (!) 175/72   Pulse 76   Temp 98 F (36.7 C) (Oral)   Resp (!) 27   Ht 5' 5 (1.651 m)   Wt 102.1 kg   SpO2 94%   BMI 37.46 kg/m  SpO2: SpO2: 94 % O2 Device: O2 Device: Room Air O2 Flow Rate:   Intake/output summary: No intake or output data in the 24 hours ending 04/06/24 0733 LBM:   Baseline Weight: Weight: 102.1 kg Most recent weight: Weight: 102.1 kg  General: NAD, awake, chronically ill-appearing Cardiovascular: RRR Respiratory: Slightly increased work of breathing noted, not in respiratory distress Abdomen: not distended Neuro: Awake          Palliative Performance Scale: 40%              Additional Data Reviewed: Recent Labs    04/04/24 1545 04/04/24 2246 04/05/24 0503  WBC 9.8  --  10.3  HGB 10.2*  --  10.2*  PLT 332  --  352  NA 132* 133* 132*  BUN 29* 30* 29*  CREATININE 1.76* 1.78* 1.76*    Imaging: ECHOCARDIOGRAM COMPLETE    ECHOCARDIOGRAM REPORT       Patient Name:   KELSIE ZABOROWSKI Date of Exam: 04/05/2024 Medical Rec #:  [id number]       Height:       65.0 in Accession #:    [id number]      Weight:       225.1 lb Date of Birth:  10/20/1934       BSA:          2.080 m Patient Age:    89 years        BP:           152/74 mmHg Patient Gender: F               HR:           81 bpm. Exam Location:  Inpatient  Procedure: 2D Echo, Cardiac Doppler and Color Doppler (Both Spectral and Color            Flow Doppler were utilized during procedure).  Indications:    I50.40* Unspecified combined systolic (congestive) and diastolic                 (congestive) heart  failure   History:        Patient has prior history of Echocardiogram examinations, most                 recent 11/19/2022. CHF; Risk Factors:Sleep Apnea, Hypertension,                 Dyslipidemia and Diabetes. Lung cancer. Volume overload.   Sonographer:    Ellouise Mose RDCS Referring Phys: [id number] DAVID MANUEL ORTIZ    Sonographer Comments: Technically difficult study due to poor echo windows, patient is obese and suboptimal parasternal window. Image acquisition challenging due to patient body habitus. Definity would have been used, but patient stated she had enough. IMPRESSIONS   1. Probable large liver cyst; suggest dedicated liver ultrasound to better assess.  2. Left ventricular ejection fraction, by estimation, is 70 to 75%. The left ventricle has hyperdynamic function. The left ventricle has  no regional wall motion abnormalities. Left ventricular diastolic parameters are consistent with Grade I diastolic  dysfunction (impaired relaxation).  3. Right ventricular systolic function is normal. The right ventricular size is normal. There is moderately elevated pulmonary artery systolic pressure. The estimated right ventricular systolic pressure is 48.6 mmHg.  4. The mitral valve is normal in structure. No evidence of mitral valve regurgitation. No evidence of mitral stenosis.  5. The aortic valve is tricuspid. Aortic valve regurgitation is trivial. No aortic stenosis is present.  6. The inferior vena cava is normal in size with greater than 50% respiratory variability, suggesting right atrial pressure of 3 mmHg.  FINDINGS  Left Ventricle: Left ventricular ejection fraction, by estimation, is 70 to 75%. The left ventricle has hyperdynamic function. The left ventricle has no regional wall motion abnormalities. The left ventricular internal cavity size was normal in size.  There is no left ventricular hypertrophy. Left ventricular diastolic parameters are consistent with Grade I diastolic  dysfunction (impaired relaxation).  Right Ventricle: The right ventricular size is normal. Right ventricular systolic function is normal. There is moderately elevated pulmonary artery systolic pressure. The tricuspid regurgitant velocity is 2.90 m/s, and with an assumed right atrial  pressure of 15 mmHg, the estimated right ventricular systolic pressure is 48.6 mmHg.  Left Atrium: Left atrial size was normal in size.  Right Atrium: Right atrial size was normal in size.  Pericardium: There is no evidence of pericardial effusion.  Mitral Valve: The mitral valve is normal in structure. Mild mitral annular calcification. No evidence of mitral valve regurgitation. No evidence of mitral valve stenosis.  Tricuspid Valve: The tricuspid valve is normal in structure. Tricuspid valve regurgitation is trivial. No evidence of tricuspid stenosis.  Aortic Valve: The aortic valve is tricuspid. Aortic valve regurgitation is trivial. No aortic stenosis is present.  Pulmonic Valve: The pulmonic valve was not well visualized. Pulmonic valve regurgitation is not visualized. No evidence of pulmonic stenosis.  Aorta: The aortic root is normal in size and structure.  Venous: The inferior vena cava was not well visualized. The inferior vena cava is normal in size with greater than 50% respiratory variability, suggesting right atrial pressure of 3 mmHg.  IAS/Shunts: The interatrial septum was not well visualized.  Additional Comments: Probable large liver cyst; suggest dedicated liver ultrasound to better assess.    LEFT VENTRICLE PLAX 2D LVIDd:         3.40 cm     Diastology LVIDs:         2.30 cm     LV e' medial:    5.22 cm/s LV PW:         1.10 cm     LV E/e' medial:  8.3 LV IVS:        1.00 cm     LV e' lateral:   6.31 cm/s LVOT diam:     2.10 cm     LV E/e' lateral: 6.9 LV SV:         53 LV SV Index:   25 LVOT Area:     3.46 cm LV IVRT:       106 msec   LV Volumes (MOD) LV vol d, MOD A2C: 39.8  ml LV vol d, MOD A4C: 46.9 ml LV vol s, MOD A2C: 11.8 ml LV vol s, MOD A4C: 12.3 ml LV SV MOD A2C:     28.0 ml LV SV MOD A4C:     46.9 ml LV SV MOD BP:  32.8 ml  RIGHT VENTRICLE             IVC RV S prime:     10.70 cm/s  IVC diam: 1.40 cm TAPSE (M-mode): 1.2 cm  LEFT ATRIUM             Index        RIGHT ATRIUM          Index LA diam:        2.80 cm 1.35 cm/m   RA Area:     9.82 cm LA Vol (A2C):   29.8 ml 14.33 ml/m  RA Volume:   17.40 ml 8.37 ml/m LA Vol (A4C):   46.0 ml 22.12 ml/m LA Biplane Vol: 38.4 ml 18.46 ml/m  AORTIC VALVE LVOT Vmax:   89.40 cm/s LVOT Vmean:  58.800 cm/s LVOT VTI:    0.153 m   AORTA Ao Root diam: 3.30 cm Ao Asc diam:  2.80 cm  MITRAL VALVE               TRICUSPID VALVE MV Area (PHT): 3.60 cm    TR Peak grad:   33.6 mmHg MV Decel Time: 211 msec    TR Vmax:        290.00 cm/s MV E velocity: 43.50 cm/s MV A velocity: 85.10 cm/s  SHUNTS MV E/A ratio:  0.51        Systemic VTI:  0.15 m                            Systemic Diam: 2.10 cm  Redell Shallow MD Electronically signed by Redell Shallow MD Signature Date/Time: 04/05/2024/3:23:29 PM      Final      I personally reviewed recent imaging.   Palliative Care Assessment and Plan Summary of Established Goals of Care and Medical Treatment Preferences   Patient is an 89 year old female with a past medical history of chronic asthma, stage IV CKD, breast cancer, type 2 diabetes mellitus, hypertension, sleep apnea on CPAP, HFpEF, and recently diagnosed stage IV lung adenocarcinoma who was admitted on 04/04/2024 for management of worsening shortness of breath for 2 days prior to admission.  Was also reported to have orthopnea and hypoxemia at 89% at home.  Since admission, patient received management for acute hypoxic respiratory failure in the setting of acute on chronic diastolic congestive heart failure, metastatic adenocarcinoma with malignant pleural effusions, and underlying sleep apnea.   Cardiology consulted for recommendations.  Palliative medicine team consulted to assist with complex medical decision making. Of note patient seen by outpatient PMT provider at [facility] on 03/20/2024.  # Complex medical decision making/goals of care  - Discussed care with patient and family at bedside as detailed above in HPI; family included patient's HCPOA/daughter, Vicki [name].  Patient not wanting to pursue cancer directed therapies.  Wanting to focus on time at home and supporting symptom management and quality of life.  Introduced hospice philosophy to support quality time at home at end-of-life.  Family specifically requesting to speak to [facility] hospice about support at home when appropriate for medical discharge.  Consulted TOC to assist with coordination of care.  At this time continuing appropriate medical interventions as family hoping for continued management of patient's CHF.  Palliative medicine team continuing to follow with patient's medical journey.  -  Code Status: Full Code    - Discussed patient's CODE STATUS as detailed above in HPI.  Patient and family had determined that  patient to remain full code as family wants time for all family members to come to bedside even if patient has died and is being kept alive on life support to be able to say goodbye, would then focus on comfort once all family present.  Took time to express concern that if patient were sick enough her heart were to stop or she were to stop breathing, chest compressions and intubation with mechanical ventilation could cause suffering and would not provide meaningful time where the patient could interact with family.  Recommended consideration of change of CODE STATUS to DNR/DNI, especially since family wants patient to remain in her home.  Remains full code at this time.  # Psycho-social/Spiritual Support:  - Support System: Daughter, husband, son, daughter-in-law  # Discharge Planning: Potentially Home with Hospice when  medically appropriate  Thank you for allowing the palliative care team to participate in the care Vicki [name].  Tinnie Radar, DO Palliative Care Provider PMT # [phone]  If patient remains symptomatic despite maximum doses, please call PMT at [phone] between 0700 and 1900. Outside of these hours, please call attending, as PMT does not have night coverage.  Billing based on MDM: High  Problems Addressed: One or more chronic illnesses with severe exacerbation, progression, or side effects of treatment.  Amount and/or Complexity of Data: Category 1:Review of prior external note(s) from each unique source, Review of the result(s) of each unique test, and Assessment requiring an independent historian(s), Category 2:Independent interpretation of a test performed by another physician/other qualified health care professional (not separately reported), and Category 3:Discussion of management or test interpretation with external physician/other qualified health care professional/appropriate source (not separately reported)      [1]  Allergies Allergen Reactions   Shellfish Allergy Swelling    Other Reaction(s): reaction 1/14 suspected it was from shellfish, Unknown   Sulfa Antibiotics Hives    Other Reaction(s): Unknown   Acetaminophen      Other Reaction(s): causes chest tightness   Levofloxacin     Other Reaction(s): trouble breathing   Metoprolol     Other Reaction(s): Unknown   Nitrofurantoin     Other Reaction(s): dizziness   Simvastatin     Other Reaction(s): myalgia   Sulfamethoxazole-Trimethoprim     Other Reaction(s): Unknown   Tramadol     Other Reaction(s): sharp pains in head   Trazodone     Other Reaction(s): ? headache, did not like how it made her feel.   Elemental Sulfur Swelling and Rash   Pantoprazole Sodium Palpitations   "

## 2024-04-06 NOTE — Progress Notes (Signed)
 BLE venous duplex has been completed.   Results can be found under chart review under CV PROC. 04/06/2024 12:12 PM Donie Lemelin RVT, RDMS

## 2024-04-06 NOTE — Evaluation (Signed)
 Physical Therapy Evaluation Patient Details Name: Vicki [name] MRN: [id number] DOB: 1935-01-23 Today's Date: 04/06/2024  History of Present Illness  Vicki [name] is a 89 y.o. female presenting to [facility] with acute hypoxic respiratory failure, volume overload, diastolic CHF exacerbation, doppler neg for LE DVT 04/06/24  PMH:  angioedema, osteoarthritis, asthma, bradycardia, stage IV CKD, left breast cancer, CKD, type 2 diabetes, GERD, gout, hypertension, scoliosis, sleep apnea on CPAP, urticaria, stage IV lung adenocarcinoma, chronic diastolic heart failure  Clinical Impression  Pt admitted as above and presenting with functional mobility limitations 2* generalized weakness, decreased activity tolerance, obesity, c/o various pain sites.  This date, pt adamant about not attempting OOB (had been up with family earlier) and agreeable only to bed mobility and repositioning for comfort.  Per dtr, arrangements are being made with home hospice for transition home and will defer to same for.further recommendations regarding equipment and HHPT/OT needs.      If plan is discharge home, recommend the following: Two people to help with walking and/or transfers;Two people to help with bathing/dressing/bathroom;Assist for transportation;Help with stairs or ramp for entrance;Assistance with cooking/housework   Can travel by private vehicle        Equipment Recommendations Other (comment) (equipment being arranged by home hospice)  Recommendations for Other Services       Functional Status Assessment Patient has had a recent decline in their functional status and demonstrates the ability to make significant improvements in function in a reasonable and predictable amount of time.     Precautions / Restrictions Precautions Precautions: Fall Restrictions Weight Bearing Restrictions Per Provider Order: No      Mobility  Bed Mobility Overal bed mobility: Needs Assistance Bed Mobility: Rolling, Supine to  Sit Rolling: Mod assist, +2 for safety/equipment, +2 for physical assistance   Supine to sit:  (Attempted but ltd participation of pt so deferred)     General bed mobility comments: Use of pad to roll side to side and to move up the bed to position for comfort    Transfers Overall transfer level: Needs assistance Equipment used: None                    Ambulation/Gait                  Stairs            Wheelchair Mobility     Tilt Bed    Modified Rankin (Stroke Patients Only)       Balance                                             Pertinent Vitals/Pain Pain Assessment Pain Assessment: Faces Faces Pain Scale: Hurts little more Pain Location: knees, toes, L arm Pain Descriptors / Indicators: Grimacing, Sore Pain Intervention(s): Limited activity within patient's tolerance, Monitored during session    Home Living Family/patient expects to be discharged to:: Private residence Living Arrangements: Spouse/significant other Available Help at Discharge: Family;Personal care attendant Type of Home: Apartment Home Access: Level entry         Home Equipment: BSC/3in1;Rolling Walker (2 wheels);Grab bars - tub/shower;Shower seat;Lift chair Additional Comments: sleeps in bed    Prior Function Prior Level of Function : Needs assist       Physical Assist : Mobility (physical);ADLs (physical) Mobility (physical): Bed mobility;Transfers;Gait   Mobility Comments:  Pt very limited household ambulator - primarily transfers bed to wc to comode using rollator       Extremity/Trunk Assessment   Upper Extremity Assessment Upper Extremity Assessment: Defer to OT evaluation    Lower Extremity Assessment Lower Extremity Assessment: Generalized weakness;RLE deficits/detail;LLE deficits/detail RLE: Unable to fully assess due to pain (and pt's limited willingness to participate) LLE: Unable to fully assess due to pain        Communication   Communication Communication: No apparent difficulties    Cognition Arousal: Alert Behavior During Therapy: Flat affect   PT - Cognitive impairments: Difficult to assess Difficult to assess due to:  (a woman of few words)                     PT - Cognition Comments: Pt with limited interaction and determined to participate as little as possible.  Dtr states she doesnt like to move         Cueing Cueing Techniques: Verbal cues, Gestural cues     General Comments      Exercises     Assessment/Plan    PT Assessment Patient needs continued PT services  PT Problem List Decreased strength;Decreased range of motion;Decreased activity tolerance;Decreased balance;Decreased mobility;Decreased coordination;Decreased cognition;Decreased knowledge of use of DME;Decreased safety awareness;Obesity;Pain       PT Treatment Interventions DME instruction;Gait training;Functional mobility training;Therapeutic activities;Therapeutic exercise;Balance training;Patient/family education    PT Goals (Current goals can be found in the Care Plan section)  Acute Rehab PT Goals Patient Stated Goal: Leave me alone PT Goal Formulation: Patient unable to participate in goal setting Time For Goal Achievement: 04/20/24 Potential to Achieve Goals: Fair    Frequency Min 2X/week     Co-evaluation PT/OT/SLP Co-Evaluation/Treatment: Yes Reason for Co-Treatment: To address functional/ADL transfers;For patient/therapist safety           AM-PAC PT 6 Clicks Mobility  Outcome Measure Help needed turning from your back to your side while in a flat bed without using bedrails?: A Lot Help needed moving from lying on your back to sitting on the side of a flat bed without using bedrails?: A Lot Help needed moving to and from a bed to a chair (including a wheelchair)?: A Lot Help needed standing up from a chair using your arms (e.g., wheelchair or bedside chair)?: Total Help  needed to walk in hospital room?: Total Help needed climbing 3-5 steps with a railing? : Total 6 Click Score: 9    End of Session   Activity Tolerance: Patient limited by fatigue;Patient limited by pain Patient left: in bed;with call bell/phone within reach;with bed alarm set;with family/visitor present Nurse Communication: Mobility status;Need for lift equipment PT Visit Diagnosis: Difficulty in walking, not elsewhere classified (R26.2);Muscle weakness (generalized) (M62.81);Pain Pain - part of body: Knee;Arm;Ankle and joints of foot    Time: 8490-8466 PT Time Calculation (min) (ACUTE ONLY): 24 min   Charges:   PT Evaluation $PT Eval Low Complexity: 1 Low   PT General Charges $$ ACUTE PT VISIT: 1 Visit         [facility] PT Acute Rehabilitation Services Office [phone]   Jeneal Vogl 04/06/2024, 4:32 PM

## 2024-04-06 NOTE — Plan of Care (Signed)
  Problem: Clinical Measurements: Goal: Ability to maintain clinical measurements within normal limits will improve Outcome: Progressing Goal: Will remain free from infection Outcome: Progressing Goal: Diagnostic test results will improve Outcome: Progressing Goal: Respiratory complications will improve Outcome: Progressing Goal: Cardiovascular complication will be avoided Outcome: Progressing   Problem: Activity: Goal: Risk for activity intolerance will decrease Outcome: Progressing   Problem: Nutrition: Goal: Adequate nutrition will be maintained Outcome: Progressing   Problem: Coping: Goal: Level of anxiety will decrease Outcome: Progressing   Problem: Elimination: Goal: Will not experience complications related to bowel motility Outcome: Progressing Goal: Will not experience complications related to urinary retention Outcome: Progressing   Problem: Pain Managment: Goal: General experience of comfort will improve and/or be controlled Outcome: Progressing   Problem: Safety: Goal: Ability to remain free from injury will improve Outcome: Progressing   Problem: Skin Integrity: Goal: Risk for impaired skin integrity will decrease Outcome: Progressing   Problem: Activity: Goal: Capacity to carry out activities will improve Outcome: Progressing   Problem: Cardiac: Goal: Ability to achieve and maintain adequate cardiopulmonary perfusion will improve Outcome: Progressing

## 2024-04-06 NOTE — Progress Notes (Signed)
 " PROGRESS NOTE    Vicki [name]  FMW:[id number] DOB: [birth year] DOA: 04/04/2024 PCP: Shayne Anes, MD    Brief Narrative:  89 year old with history of chronic intermittent asthma, stage IV CKD, breast cancer, type 2 diabetes, hypertension, sleep apnea on CPAP at home, recently diagnosed stage IV lung adenocarcinoma and chronic diastolic heart failure brought to the emergency room with progressive shortness of breath for at least 2 days.  Also reported orthopnea and hypoxemia with 89% at home.  She uses CPAP at night but does not use oxygen.  In the emergency room on 2 L oxygen, COVID flu and RSV negative.  Lactic acid normal.  Chest x-ray with low lung volumes, moderate right pleural effusion and right basilar opacity.  Initially given IV fluids and then admitted to the hospital with volume overload and treated with IV diuresis.  Subjective: Patient seen and examined at 8 AM rounds.  She was still in the emergency room.  Her daughter and husband were at the bedside.  Patient was sleeping comfortably on CPAP.  Patient told me that she feels much better but gets out of breath on walking to the bathroom so they gave her bedside commode.  At least able to sleep flat.  According to the daughter, they are trying to talk to device company to exchange her CPAP as it is not working appropriately. Urine output not very well-documented. Morning labs not available.  Assessment & Plan:   Acute hypoxemic respiratory failure which is multifactorial Acute on chronic diastolic congestive heart failure, metastatic adenocarcinoma with malignant pleural effusion, underlying sleep apnea   Echocardiogram with EF 70 to 75%, grade 1 diastolic dysfunction.  Pleural effusion and shortness of breath is likely multifactorial. Responding to IV diuresis.  Continue IV Lasix  today.  Intake output monitoring.  Cardiology following. Lower extremity duplexes with no evidence of DVT. Mobilize with PT OT.  CKD stage IV with  hyperkalemia: Currently on Lasix .  Received dose of Lokelma  yesterday.  Renal function test from today pending.  Will follow-up.  Stage IV non-small cell lung cancer with malignant pleural effusion: Patient has decided not to seek any treatment.  Followed by outpatient palliative care. Palliative care consulted for inpatient consultation and discharge planning.  Likely will benefit with home with home hospice.  Hypertension low blood pressure stable on Coreg  and amlodipine .  Holding olmesartan .  Sleep apnea on CPAP: Using at bedtime.  Hypothyroidism: On Synthroid.    DVT prophylaxis: heparin injection 5,000 Units Start: 04/04/24 2200   Code Status: Full code-ongoing palliative care discussions Family Communication: Daughter and husband at the bedside Disposition Plan: Status is: Inpatient Remains inpatient appropriate because: IV diuresis     Consultants:  Cardiology Palliative care  Procedures:  None  Antimicrobials:  None     Objective: Vitals:   04/06/24 0440 04/06/24 0442 04/06/24 0852 04/06/24 1125  BP: (!) 175/72  (!) 175/72 (!) 154/57  Pulse: 76  85 86  Resp: (!) 27  (!) 25 15  Temp:  98 F (36.7 C) 98.6 F (37 C) 98.2 F (36.8 C)  TempSrc:  Oral Oral Oral  SpO2: 94%  93%   Weight:      Height:       No intake or output data in the 24 hours ending 04/06/24 1306 Filed Weights   04/04/24 1216  Weight: 102.1 kg    Examination:  General exam: Appears calm and comfortable while sleeping on CPAP. Respiratory system: Clear to auscultation. Respiratory effort normal.  No added sounds. Cardiovascular system: S1 & S2 heard, RRR.  Bilateral 1+ edema.  Left more than right.  Nontender. Gastrointestinal system: Abdomen is nondistended, soft and nontender. No organomegaly or masses felt. Normal bowel sounds heard.  Obese and pendulous. Central nervous system: Alert and oriented. No focal neurological deficits.  Gross weakness.    Data Reviewed: I have  personally reviewed following labs and imaging studies  CBC: Recent Labs  Lab 04/04/24 1545 04/05/24 0503  WBC 9.8 10.3  NEUTROABS 7.3  --   HGB 10.2* 10.2*  HCT 33.1* 31.7*  MCV 86.0 84.5  PLT 332 352   Basic Metabolic Panel: Recent Labs  Lab 04/04/24 1545 04/04/24 2246 04/05/24 0503  NA 132* 133* 132*  K 6.0* 5.5* 5.5*  CL 99 100 100  CO2 24 24 24   GLUCOSE 116* 164* 111*  BUN 29* 30* 29*  CREATININE 1.76* 1.78* 1.76*  CALCIUM  8.9 8.6* 8.8*   GFR: Estimated Creatinine Clearance: 26.7 mL/min (A) (by C-G formula based on SCr of 1.76 mg/dL (H)). Liver Function Tests: Recent Labs  Lab 04/04/24 1545 04/05/24 0503  AST 16 13*  ALT 14 13  ALKPHOS 52 51  BILITOT <0.2 0.2  PROT 7.0 6.8  ALBUMIN 3.2* 2.9*   No results for input(s): LIPASE, AMYLASE in the last 168 hours. No results for input(s): AMMONIA in the last 168 hours. Coagulation Profile: No results for input(s): INR, PROTIME in the last 168 hours. Cardiac Enzymes: No results for input(s): CKTOTAL, CKMB, CKMBINDEX, TROPONINI in the last 168 hours. BNP (last 3 results) Recent Labs    04/04/24 1545  PROBNP 441.0*   HbA1C: Recent Labs    04/05/24 0503  HGBA1C 6.8*   CBG: No results for input(s): GLUCAP in the last 168 hours. Lipid Profile: No results for input(s): CHOL, HDL, LDLCALC, TRIG, CHOLHDL, LDLDIRECT in the last 72 hours. Thyroid Function Tests: No results for input(s): TSH, T4TOTAL, FREET4, T3FREE, THYROIDAB in the last 72 hours. Anemia Panel: No results for input(s): VITAMINB12, FOLATE, FERRITIN, TIBC, IRON, RETICCTPCT in the last 72 hours. Sepsis Labs: Recent Labs  Lab 04/04/24 1550  LATICACIDVEN 0.8    Recent Results (from the past 240 hours)  Resp panel by RT-PCR (RSV, Flu A&B, Covid) Anterior Nasal Swab     Status: None   Collection Time: 04/04/24  1:11 PM   Specimen: Anterior Nasal Swab  Result Value Ref Range Status   SARS  Coronavirus 2 by RT PCR NEGATIVE NEGATIVE Final    Comment: (NOTE) SARS-CoV-2 target nucleic acids are NOT DETECTED.  The SARS-CoV-2 RNA is generally detectable in upper respiratory specimens during the acute phase of infection. The lowest concentration of SARS-CoV-2 viral copies this assay can detect is 138 copies/mL. A negative result does not preclude SARS-Cov-2 infection and should not be used as the sole basis for treatment or other patient management decisions. A negative result may occur with  improper specimen collection/handling, submission of specimen other than nasopharyngeal swab, presence of viral mutation(s) within the areas targeted by this assay, and inadequate number of viral copies(<138 copies/mL). A negative result must be combined with clinical observations, patient history, and epidemiological information. The expected result is Negative.  Fact Sheet for Patients:  bloggercourse.com  Fact Sheet for Healthcare Providers:  seriousbroker.it  This test is no t yet approved or cleared by the [place]  FDA and  has been authorized for detection and/or diagnosis of SARS-CoV-2 by FDA under an Emergency Use Authorization (EUA). This EUA  will remain  in effect (meaning this test can be used) for the duration of the COVID-19 declaration under Section 564(b)(1) of the Act, 21 U.S.C.section 360bbb-3(b)(1), unless the authorization is terminated  or revoked sooner.       Influenza A by PCR NEGATIVE NEGATIVE Final   Influenza B by PCR NEGATIVE NEGATIVE Final    Comment: (NOTE) The Xpert Xpress SARS-CoV-2/FLU/RSV plus assay is intended as an aid in the diagnosis of influenza from Nasopharyngeal swab specimens and should not be used as a sole basis for treatment. Nasal washings and aspirates are unacceptable for Xpert Xpress SARS-CoV-2/FLU/RSV testing.  Fact Sheet for  Patients: bloggercourse.com  Fact Sheet for Healthcare Providers: seriousbroker.it  This test is not yet approved or cleared by the [place]  FDA and has been authorized for detection and/or diagnosis of SARS-CoV-2 by FDA under an Emergency Use Authorization (EUA). This EUA will remain in effect (meaning this test can be used) for the duration of the COVID-19 declaration under Section 564(b)(1) of the Act, 21 U.S.C. section 360bbb-3(b)(1), unless the authorization is terminated or revoked.     Resp Syncytial Virus by PCR NEGATIVE NEGATIVE Final    Comment: (NOTE) Fact Sheet for Patients: bloggercourse.com  Fact Sheet for Healthcare Providers: seriousbroker.it  This test is not yet approved or cleared by the [place]  FDA and has been authorized for detection and/or diagnosis of SARS-CoV-2 by FDA under an Emergency Use Authorization (EUA). This EUA will remain in effect (meaning this test can be used) for the duration of the COVID-19 declaration under Section 564(b)(1) of the Act, 21 U.S.C. section 360bbb-3(b)(1), unless the authorization is terminated or revoked.  Performed at [facility], [address] [place], [place] [address]   Culture, blood (routine x 2)     Status: None (Preliminary result)   Collection Time: 04/04/24  3:46 PM   Specimen: BLOOD  Result Value Ref Range Status   Specimen Description   Final    BLOOD LEFT ANTECUBITAL Performed at [facility], [address], [place], [place] [address]    Special Requests   Final    BOTTLES DRAWN AEROBIC AND ANAEROBIC Blood Culture results may not be optimal due to an inadequate volume of blood received in culture bottles Performed at [facility], [address] [place], [place] [address]    Culture   Final    NO GROWTH 2 DAYS Performed at [facility], [address], [place], [place] [address]    Report Status PENDING  Incomplete         Radiology Studies: VAS US  LOWER EXTREMITY VENOUS (DVT) Result Date: 04/06/2024  Lower Venous DVT Study Patient Name:  LEONIE AMACHER  Date of Exam:   04/06/2024 Medical Rec #: [id number]        Accession #:    [id number] Date of Birth: 1934-10-07        Patient Gender: F Patient Age:   24 years Exam Location:  [facility] Procedure:      VAS US  LOWER EXTREMITY VENOUS (DVT) Referring Phys: CONCEPCION RISER --------------------------------------------------------------------------------  Indications: Edema, and SOB.  Limitations: Poor ultrasound/tissue interface. Comparison Study: Previous exam on 08/12/2012 was negative for DVT Performing Technologist: Ezzie Potters RVT, RDMS  Examination Guidelines: A complete evaluation includes B-mode imaging, spectral Doppler, color Doppler, and power Doppler as needed of all accessible portions of each vessel. Bilateral testing is considered an integral part of a complete examination. Limited  examinations for reoccurring indications may be performed as noted. The reflux portion of the exam is performed with the patient in reverse Trendelenburg.  +---------+---------------+---------+-----------+----------+--------------+ RIGHT    CompressibilityPhasicitySpontaneityPropertiesThrombus Aging +---------+---------------+---------+-----------+----------+--------------+ CFV      Full           Yes      Yes                                 +---------+---------------+---------+-----------+----------+--------------+ SFJ      Full                                                        +---------+---------------+---------+-----------+----------+--------------+ FV Prox  Full           Yes      Yes                                 +---------+---------------+---------+-----------+----------+--------------+ FV Mid   Full           Yes      Yes                                  +---------+---------------+---------+-----------+----------+--------------+ FV DistalFull           Yes      Yes                                 +---------+---------------+---------+-----------+----------+--------------+ PFV      Full                                                        +---------+---------------+---------+-----------+----------+--------------+ POP      Full           Yes      Yes                                 +---------+---------------+---------+-----------+----------+--------------+ PTV      Full                                                        +---------+---------------+---------+-----------+----------+--------------+ PERO     Full                                                        +---------+---------------+---------+-----------+----------+--------------+   +---------+---------------+---------+-----------+----------+-------------------+ LEFT     CompressibilityPhasicitySpontaneityPropertiesThrombus Aging      +---------+---------------+---------+-----------+----------+-------------------+ CFV      Full           Yes      Yes                                      +---------+---------------+---------+-----------+----------+-------------------+  SFJ      Full                                                             +---------+---------------+---------+-----------+----------+-------------------+ FV Prox  Full           Yes      Yes                                      +---------+---------------+---------+-----------+----------+-------------------+ FV Mid   Full           Yes      Yes                                      +---------+---------------+---------+-----------+----------+-------------------+ FV DistalFull           Yes      Yes                                      +---------+---------------+---------+-----------+----------+-------------------+ PFV      Full                                                              +---------+---------------+---------+-----------+----------+-------------------+ POP      Full           Yes      Yes                                      +---------+---------------+---------+-----------+----------+-------------------+ PTV      Full                                         Not well visualized +---------+---------------+---------+-----------+----------+-------------------+ PERO     Full                                         Not well visualized +---------+---------------+---------+-----------+----------+-------------------+     Summary: BILATERAL: - No evidence of deep vein thrombosis seen in the lower extremities, bilaterally. -No evidence of popliteal cyst, bilaterally. -Subcutaneous edema, bilaterally L > R.   *See table(s) above for measurements and observations.    Preliminary    US  Abdomen Limited RUQ (LIVER/GB) Result Date: 04/06/2024 EXAM: Right Upper Quadrant Abdominal Ultrasound 04/06/2024 08:33:00 AM TECHNIQUE: Real-time ultrasonography of the right upper quadrant of the abdomen was performed. COMPARISON: CT Abdomen Pelvis 09/17/2016. Abdominal ultrasound 04/02/2010. CLINICAL HISTORY: Liver cyst. FINDINGS: LIVER: Normal echogenicity. No intrahepatic biliary ductal dilatation. Hepatopetal flow in the portal vein. There is a septated cyst within the right hepatic lobe, measuring approximately 1.9 x 1.7 x 1.5 cm. BILIARY SYSTEM:  No pericholecystic fluid or wall thickening. No cholelithiasis. Common bile duct is not visualized. RIGHT KIDNEY: No hydronephrosis. No echogenic calculi. There is a simple right renal cyst present, measuring approximately 10.0 x 9.5 x 9.4 cm. OTHER: No right upper quadrant ascites. IMPRESSION: 1. Simple right renal cyst measuring approximately 10.0 x 9.5 x 9.4 cm. 2. Septated cyst within the right hepatic lobe measuring approximately 1.9 x 1.7 x 1.5 cm. Electronically signed by: Evalene Coho MD  04/06/2024 08:44 AM EST RP Workstation: [id number]   ECHOCARDIOGRAM COMPLETE Result Date: 04/05/2024    ECHOCARDIOGRAM REPORT   Patient Name:   Vicki [name] Date of Exam: 04/05/2024 Medical Rec #:  [id number]       Height:       65.0 in Accession #:    [id number]      Weight:       225.1 lb Date of Birth:  27-Jan-1935       BSA:          2.080 m Patient Age:    89 years        BP:           152/74 mmHg Patient Gender: F               HR:           81 bpm. Exam Location:  Inpatient Procedure: 2D Echo, Cardiac Doppler and Color Doppler (Both Spectral and Color            Flow Doppler were utilized during procedure). Indications:    I50.40* Unspecified combined systolic (congestive) and diastolic                 (congestive) heart failure  History:        Patient has prior history of Echocardiogram examinations, most                 recent 11/19/2022. CHF; Risk Factors:Sleep Apnea, Hypertension,                 Dyslipidemia and Diabetes. Lung cancer. Volume overload.  Sonographer:    Ellouise Mose RDCS Referring Phys: [id number] DAVID MANUEL ORTIZ  Sonographer Comments: Technically difficult study due to poor echo windows, patient is obese and suboptimal parasternal window. Image acquisition challenging due to patient body habitus. Definity would have been used, but patient stated she had enough. IMPRESSIONS  1. Probable large liver cyst; suggest dedicated liver ultrasound to better assess.  2. Left ventricular ejection fraction, by estimation, is 70 to 75%. The left ventricle has hyperdynamic function. The left ventricle has no regional wall motion abnormalities. Left ventricular diastolic parameters are consistent with Grade I diastolic dysfunction (impaired relaxation).  3. Right ventricular systolic function is normal. The right ventricular size is normal. There is moderately elevated pulmonary artery systolic pressure. The estimated right ventricular systolic pressure is 48.6 mmHg.  4. The mitral valve is normal in  structure. No evidence of mitral valve regurgitation. No evidence of mitral stenosis.  5. The aortic valve is tricuspid. Aortic valve regurgitation is trivial. No aortic stenosis is present.  6. The inferior vena cava is normal in size with greater than 50% respiratory variability, suggesting right atrial pressure of 3 mmHg. FINDINGS  Left Ventricle: Left ventricular ejection fraction, by estimation, is 70 to 75%. The left ventricle has hyperdynamic function. The left ventricle has no regional wall motion abnormalities. The left ventricular internal cavity size was normal in size. There is no left ventricular hypertrophy.  Left ventricular diastolic parameters are consistent with Grade I diastolic dysfunction (impaired relaxation). Right Ventricle: The right ventricular size is normal. Right ventricular systolic function is normal. There is moderately elevated pulmonary artery systolic pressure. The tricuspid regurgitant velocity is 2.90 m/s, and with an assumed right atrial pressure of 15 mmHg, the estimated right ventricular systolic pressure is 48.6 mmHg. Left Atrium: Left atrial size was normal in size. Right Atrium: Right atrial size was normal in size. Pericardium: There is no evidence of pericardial effusion. Mitral Valve: The mitral valve is normal in structure. Mild mitral annular calcification. No evidence of mitral valve regurgitation. No evidence of mitral valve stenosis. Tricuspid Valve: The tricuspid valve is normal in structure. Tricuspid valve regurgitation is trivial. No evidence of tricuspid stenosis. Aortic Valve: The aortic valve is tricuspid. Aortic valve regurgitation is trivial. No aortic stenosis is present. Pulmonic Valve: The pulmonic valve was not well visualized. Pulmonic valve regurgitation is not visualized. No evidence of pulmonic stenosis. Aorta: The aortic root is normal in size and structure. Venous: The inferior vena cava was not well visualized. The inferior vena cava is normal in  size with greater than 50% respiratory variability, suggesting right atrial pressure of 3 mmHg. IAS/Shunts: The interatrial septum was not well visualized. Additional Comments: Probable large liver cyst; suggest dedicated liver ultrasound to better assess.  LEFT VENTRICLE PLAX 2D LVIDd:         3.40 cm     Diastology LVIDs:         2.30 cm     LV e' medial:    5.22 cm/s LV PW:         1.10 cm     LV E/e' medial:  8.3 LV IVS:        1.00 cm     LV e' lateral:   6.31 cm/s LVOT diam:     2.10 cm     LV E/e' lateral: 6.9 LV SV:         53 LV SV Index:   25 LVOT Area:     3.46 cm LV IVRT:       106 msec  LV Volumes (MOD) LV vol d, MOD A2C: 39.8 ml LV vol d, MOD A4C: 46.9 ml LV vol s, MOD A2C: 11.8 ml LV vol s, MOD A4C: 12.3 ml LV SV MOD A2C:     28.0 ml LV SV MOD A4C:     46.9 ml LV SV MOD BP:      32.8 ml RIGHT VENTRICLE             IVC RV S prime:     10.70 cm/s  IVC diam: 1.40 cm TAPSE (M-mode): 1.2 cm LEFT ATRIUM             Index        RIGHT ATRIUM          Index LA diam:        2.80 cm 1.35 cm/m   RA Area:     9.82 cm LA Vol (A2C):   29.8 ml 14.33 ml/m  RA Volume:   17.40 ml 8.37 ml/m LA Vol (A4C):   46.0 ml 22.12 ml/m LA Biplane Vol: 38.4 ml 18.46 ml/m  AORTIC VALVE LVOT Vmax:   89.40 cm/s LVOT Vmean:  58.800 cm/s LVOT VTI:    0.153 m  AORTA Ao Root diam: 3.30 cm Ao Asc diam:  2.80 cm MITRAL VALVE  TRICUSPID VALVE MV Area (PHT): 3.60 cm    TR Peak grad:   33.6 mmHg MV Decel Time: 211 msec    TR Vmax:        290.00 cm/s MV E velocity: 43.50 cm/s MV A velocity: 85.10 cm/s  SHUNTS MV E/A ratio:  0.51        Systemic VTI:  0.15 m                            Systemic Diam: 2.10 cm Redell Shallow MD Electronically signed by Redell Shallow MD Signature Date/Time: 04/05/2024/3:23:29 PM    Final    CT Chest Wo Contrast Result Date: 04/04/2024 EXAM: CT CHEST WITHOUT CONTRAST 04/04/2024 05:01:10 PM TECHNIQUE: CT of the chest was performed without the administration of intravenous contrast.  Multiplanar reformatted images are provided for review. Automated exposure control, iterative reconstruction, and/or weight based adjustment of the mA/kV was utilized to reduce the radiation dose to as low as reasonably achievable. COMPARISON: 03/15/2024 CLINICAL HISTORY: Pleural effusion, malignancy suspected. Respiratory illness, nondiagnostic xray. Dyspnea, lung cancer, congestive heart failure. Hypoxia. *tracking code: Bo* FINDINGS: MEDIASTINUM: Global cardiac size within normal limits. No pericardial effusion. Moderate coronary artery calcification. Moderate atherosclerotic calcification within the thoracic aorta. No aortic aneurysm. Central pulmonary arteries are of normal caliber. The central airways are clear. LYMPH NODES: No mediastinal, hilar or axillary lymphadenopathy. LUNGS AND PLEURA: Extensive pleural nodularity is again identified in keeping with pleural metastatic disease, stable since prior examination. Index nodule within the right major fissure measures 2.4 x 2.8 cm. Pleural-based nodule anteriorly at the right lung base measures 12 mm in maximal diameter. There is, however, better visualization of a hyperdense pleural mass within the basilar right pleural space (series 105, image 2). This is most in keeping with a component of the pleural metastatic disease. Less likely, this may represent a central clot in the setting of a hemothorax. This could be differentiated with contrast-enhanced Mri examination. Moderate right pleural effusion, likely malignant, is again identified, stable since prior examination. Stable compressive atelectasis of the right lung base. Small bilateral pulmonary nodules are again identified with no significant interval change, most in keeping with intrapulmonary metastatic disease. Index nodule within the atelectatic right lower lobe measures 11 mm. No central obstructing lesion. No pneumothorax. No focal consolidation or pulmonary edema. SOFT TISSUES/BONES: Osseous  structures are age appropriate. No acute bone abnormality. No lytic or blastic bone lesion. Postsurgical changes noted within the left breast. Moderate subcutaneous edema noted in the left lateral abdominal wall comparison visualized. UPPER ABDOMEN: Limited images of the upper abdomen demonstrate simple cortical cysts within the visualized upper pole of the kidneys bilaterally measuring at least 10.7 cm on the right and at least 3 cm on the left. No follow up imaging is recommended. IMPRESSION: 1. Stable pleural metastatic disease with moderate right pleural effusion, likely malignant, and stable associated compressive atelectasis at the right lung base. 2. Hyperdense pleural mass in the basilar right pleural space, favored to represent pleural metastatic disease; less likely central clot in the setting of hemothorax, and contrast-enhanced MRI could differentiate. 3. Stable small bilateral pulmonary nodules, most in keeping with intrapulmonary metastatic disease. 4. Moderate coronary artery calcification. 5. Postsurgical changes in the left breast. 6. Raf score includes aortic atherosclerosis (ICD10-I70.0). Electronically signed by: Dorethia Molt MD 04/04/2024 06:17 PM EST RP Workstation: [id number]        Scheduled Meds:  allopurinol   150 mg Oral  Daily   amLODipine   5 mg Oral QPM   aspirin  EC  81 mg Oral QODAY   carvedilol   12.5 mg Oral BID WC   furosemide   80 mg Intravenous BID   heparin  5,000 Units Subcutaneous Q8H   levothyroxine  25 mcg Oral Daily   rosuvastatin   10 mg Oral Weekly   Continuous Infusions:   LOS: 0 days      Renato Applebaum, MD [facility]   "

## 2024-04-06 NOTE — Progress Notes (Signed)
" °   04/06/24 2120  BiPAP/CPAP/SIPAP  BiPAP/CPAP/SIPAP Pt Type Adult  Reason BIPAP/CPAP not in use Non-compliant (refuses at this time. Equipment remains at bedside.)    "

## 2024-04-06 NOTE — Progress Notes (Signed)
" °   04/06/24 2331  BiPAP/CPAP/SIPAP  BiPAP/CPAP/SIPAP Pt Type Adult  BiPAP/CPAP/SIPAP Resmed  Mask Type Nasal mask (from home)  Dentures removed? Not applicable  FiO2 (%) 21 %  Patient Home Machine No  Patient Home Mask Yes  Patient Home Tubing Yes  Auto Titrate Yes  Minimum cmH2O 11 cmH2O  Maximum cmH2O 12 cmH2O  CPAP/SIPAP surface wiped down Yes  Device Plugged into RED Power Outlet Yes  BiPAP/CPAP /SiPAP Vitals  Pulse Rate 74  Resp 18  SpO2 97 %  Bilateral Breath Sounds Diminished  MEWS Score/Color  MEWS Score 0  MEWS Score Color Green    "

## 2024-04-06 NOTE — Evaluation (Signed)
 Occupational Therapy Evaluation Patient Details Name: Vicki [name] MRN: [id number] DOB: [birth year] Today's Date: 04/06/2024   History of Present Illness   Vicki [name] is a 89 y.o. female presenting to [facility] with acute hypoxic respiratory failure, volume overload, diastolic CHF exacerbation, doppler neg for LE DVT 04/06/24  PMH:  angioedema, osteoarthritis, asthma, bradycardia, stage IV CKD, left breast cancer, CKD, type 2 diabetes, GERD, gout, hypertension, scoliosis, sleep apnea on CPAP, urticaria, stage IV lung adenocarcinoma, chronic diastolic heart failure     Clinical Impressions PTA, patient lives at home with husband and has a 5x per week daily pca and family support as well. Family bedside who report they can add hours for pca if need be. Patient uses rollator at baseline to pivot into w/c for commode use and has assist for self care.  Currently, patient presents with deficits outlined below (see OT Problem List for details) most significantly pain, generalized muscle weakness, L UE baseline ROM/coordination deficits, body habitus,     decreased activity tolerance, balance and cognition limiting BADL's and functional mobility performance. OT recommending caregiver assist and support, hospital bed and HHOT services upon discharge from acute setting.  Patient requires continued Acute care hospital level OT services to progress safety and functional performance and allow for discharge.        If plan is discharge home, recommend the following:   Two people to help with walking and/or transfers;A lot of help with bathing/dressing/bathroom;Assistance with cooking/housework;Assistance with feeding;Direct supervision/assist for medications management;Direct supervision/assist for financial management;Assist for transportation;Help with stairs or ramp for entrance;Supervision due to cognitive status     Functional Status Assessment   Patient has had a recent decline in their functional  status and demonstrates the ability to make significant improvements in function in a reasonable and predictable amount of time.     Equipment Recommendations   Hospital bed      Precautions/Restrictions   Precautions Precautions: Fall Restrictions Weight Bearing Restrictions Per Provider Order: No     Mobility Bed Mobility Overal bed mobility: Needs Assistance Bed Mobility: Rolling, Supine to Sit Rolling: Mod assist, +2 for safety/equipment, +2 for physical assistance   Supine to sit:  (Attempted but ltd participation of pt so deferred)     General bed mobility comments: Use of pad to roll side to side and to move up the bed to position for comfort    Transfers Overall transfer level: Needs assistance Equipment used: None                      Balance                                           ADL either performed or assessed with clinical judgement   ADL Overall ADL's : Needs assistance/impaired Eating/Feeding: Moderate assistance   Grooming: Moderate assistance;Bed level   Upper Body Bathing: Maximal assistance;Bed level   Lower Body Bathing: Total assistance   Upper Body Dressing : Maximal assistance;Bed level   Lower Body Dressing: Total assistance;Bed level   Toilet Transfer: Maximal assistance;+2 for physical assistance;+2 for safety/equipment Toilet Transfer Details (indicate cue type and reason): family assists patient here in hospital as patient will not use BSC, OT rec STEDY for safe transfers with nursing Toileting- Clothing Manipulation and Hygiene: Total assistance       Functional mobility during ADLs: Maximal  assistance;+2 for physical assistance;+2 for safety/equipment General ADL Comments: poor tolerance for any tasks as patient just returned from bathroom with family and adamant re not mobilizing beyone EOB     Vision Baseline Vision/History: 0 No visual deficits;1 Wears glasses              Pertinent  Vitals/Pain Pain Assessment Faces Pain Scale: Hurts little more Pain Location: knees, toes, L arm Pain Descriptors / Indicators: Grimacing, Sore     Extremity/Trunk Assessment Upper Extremity Assessment Upper Extremity Assessment: Generalized weakness;Right hand dominant;LUE deficits/detail LUE Deficits / Details: hx of L shoulder issues and baseline only able to use as gross assist as per family LUE Coordination: decreased fine motor;decreased gross motor   Lower Extremity Assessment Lower Extremity Assessment: Defer to PT evaluation RLE:  (and pt's limited willingness to participate) LLE: Unable to fully assess due to pain   Cervical / Trunk Assessment Cervical / Trunk Assessment: Kyphotic   Communication Communication Communication: No apparent difficulties   Cognition Arousal: Alert Behavior During Therapy: Flat affect Cognition: History of cognitive impairments             OT - Cognition Comments: decreased insight and safety, family provides guidance and support                 Following commands: Intact       Cueing  General Comments   Cueing Techniques: Verbal cues;Gestural cues  poor activity tolerance, fluctuates with functional levels family reports and is close to baseline           Home Living Family/patient expects to be discharged to:: Private residence Living Arrangements: Spouse/significant other Available Help at Discharge: Family;Personal care attendant Type of Home: Apartment Home Access: Level entry           Bathroom Shower/Tub: Producer, Television/film/video: Handicapped height Bathroom Accessibility: Yes How Accessible: Accessible via walker Home Equipment: BSC/3in1;Rolling Walker (2 wheels);Grab bars - tub/shower;Shower seat;Lift chair   Additional Comments: sleeps in bed      Prior Functioning/Environment Prior Level of Function : Needs assist       Physical Assist : Mobility (physical);ADLs  (physical) Mobility (physical): Bed mobility;Transfers;Gait   Mobility Comments: Pt very limited household ambulator - primarily transfers bed to wc to comode using rollator      OT Problem List: Decreased strength;Decreased range of motion;Decreased activity tolerance;Impaired balance (sitting and/or standing);Decreased coordination;Decreased cognition;Decreased knowledge of use of DME or AE;Decreased knowledge of precautions;Cardiopulmonary status limiting activity;Obesity;Impaired UE functional use;Pain   OT Treatment/Interventions: Self-care/ADL training;Therapeutic exercise;Neuromuscular education;Energy conservation;DME and/or AE instruction;Manual therapy;Therapeutic activities;Cognitive remediation/compensation;Patient/family education;Balance training      OT Goals(Current goals can be found in the care plan section)   Acute Rehab OT Goals Patient Stated Goal: to be left alone OT Goal Formulation: With patient/family Time For Goal Achievement: 04/20/24 Potential to Achieve Goals: Fair ADL Goals Pt Will Perform Eating: with set-up;sitting Pt Will Perform Grooming: with min assist;sitting Pt Will Perform Upper Body Bathing: with mod assist;sitting Pt Will Perform Upper Body Dressing: with min assist;sitting Pt Will Transfer to Toilet: with mod assist;regular height toilet;stand pivot transfer   OT Frequency:  Min 1X/week    Co-evaluation   Reason for Co-Treatment: To address functional/ADL transfers;For patient/therapist safety          AM-PAC OT 6 Clicks Daily Activity     Outcome Measure Help from another person eating meals?: A Lot Help from another person taking care of personal grooming?: A  Lot Help from another person toileting, which includes using toliet, bedpan, or urinal?: Total Help from another person bathing (including washing, rinsing, drying)?: A Lot Help from another person to put on and taking off regular upper body clothing?: A Lot Help from  another person to put on and taking off regular lower body clothing?: Total 6 Click Score: 10   End of Session Equipment Utilized During Treatment: Gait belt Nurse Communication: Mobility status;Other (comment) (nursing discussed safety issues with family assisting to bathroom, therapy rec +2 assist with STEDY and staff)  Activity Tolerance: Patient limited by pain;Patient limited by fatigue Patient left: in bed;with call bell/phone within reach;with bed alarm set;with family/visitor present  OT Visit Diagnosis: Unsteadiness on feet (R26.81);Other abnormalities of gait and mobility (R26.89);Muscle weakness (generalized) (M62.81);Hemiplegia and hemiparesis;Pain Pain - part of body:  (feet and joints)                Time: 1458-1530 OT Time Calculation (min): 32 min Charges:  OT General Charges $OT Visit: 1 Visit OT Evaluation $OT Eval Low Complexity: 1 Low  Maicie Vanderloop OT/L Acute Rehabilitation Department  [phone]  04/06/2024, 5:14 PM

## 2024-04-07 ENCOUNTER — Other Ambulatory Visit (HOSPITAL_COMMUNITY): Payer: Self-pay

## 2024-04-07 DIAGNOSIS — J9601 Acute respiratory failure with hypoxia: Secondary | ICD-10-CM | POA: Diagnosis not present

## 2024-04-07 LAB — BASIC METABOLIC PANEL WITH GFR
Anion gap: 9 (ref 5–15)
BUN: 31 mg/dL — ABNORMAL HIGH (ref 8–23)
CO2: 28 mmol/L (ref 22–32)
Calcium: 9 mg/dL (ref 8.9–10.3)
Chloride: 95 mmol/L — ABNORMAL LOW (ref 98–111)
Creatinine, Ser: 1.9 mg/dL — ABNORMAL HIGH (ref 0.44–1.00)
GFR, Estimated: 25 mL/min — ABNORMAL LOW
Glucose, Bld: 106 mg/dL — ABNORMAL HIGH (ref 70–99)
Potassium: 4.8 mmol/L (ref 3.5–5.1)
Sodium: 133 mmol/L — ABNORMAL LOW (ref 135–145)

## 2024-04-07 MED ORDER — FUROSEMIDE 40 MG PO TABS
40.0000 mg | ORAL_TABLET | Freq: Every day | ORAL | 2 refills | Status: AC
  Filled 2024-04-07: qty 30, 30d supply, fill #0

## 2024-04-07 MED ORDER — FUROSEMIDE 40 MG PO TABS
40.0000 mg | ORAL_TABLET | Freq: Every day | ORAL | Status: DC
  Administered 2024-04-07: 40 mg via ORAL
  Filled 2024-04-07: qty 1

## 2024-04-07 NOTE — TOC Initial Note (Addendum)
 Transition of Care [facility]) - Initial/Assessment Note    Patient Details  Name: Vicki [name] MRN: [id number] Date of Birth: Nov 01, 1934  [facility] CM/SW Contact:    Sonda Manuella Quill, RN Phone Number: 04/07/2024, 11:36 AM  Clinical Narrative:                 IP CM consult for [facility] hospice; spoke w/ pt, husband, and dtr [name] [phone]) at bedside; she said pt lives at home w/ her husband; family plans for her to return w/ family support and [facility] at d/c; transport by [facility]; insurance/PCP verified; she denied pt experiencing SDOH risks; pt has Rollator, wheelchair, BSC, shower chair; she has HH aide from [facility]; pt does not have home oxygen; dtr verified d/c address: [address], [place] [phone]; referral to [facility] given to [facility], via secure chat; she will contact pt's dtr; agency contact info placed in follow up provider section of d/c instructions; [facility] called at [phone]; spoke w/ Parne; no IP CM needs.  Expected Discharge Plan: Home w Hospice Care Barriers to Discharge: No Barriers Identified   Patient Goals and CMS Choice Patient states their goals for this hospitalization and ongoing recovery are:: home          Expected Discharge Plan and Services   Discharge Planning Services: CM Consult   Living arrangements for the past 2 months: Apartment Expected Discharge Date: 04/07/24               DME Arranged: N/A DME Agency: NA       HH Arranged: NA HH Agency: NA        Prior Living Arrangements/Services Living arrangements for the past 2 months: Apartment Lives with:: Spouse Patient language and need for interpreter reviewed:: Yes Do you feel safe going back to the place where you live?: Yes      Need for Family Participation in Patient Care: Yes (Comment) Care giver support system in place?: Yes (comment) Current home services: DME (Rollator, wheelchair, BSC, shower chair) Criminal  Activity/Legal Involvement Pertinent to Current Situation/Hospitalization: No - Comment as needed  Activities of Daily Living      Permission Sought/Granted Permission sought to share information with : Case Manager Permission granted to share information with : Yes, Verbal Permission Granted  Share Information with NAME: Case Manager     Permission granted to share info w Relationship: [name] (dtr) [phone]     Emotional Assessment Appearance:: Appears stated age Attitude/Demeanor/Rapport: Gracious Affect (typically observed): Accepting Orientation: : Oriented to Self Alcohol / Substance Use: Not Applicable Psych Involvement: No (comment)  Admission diagnosis:  Dyspnea on exertion [R06.09] General weakness [R53.1] Acute respiratory failure with hypoxia (HCC) [J96.01] Patient Active Problem List   Diagnosis Date Noted   Palliative care encounter 04/06/2024   Goals of care, counseling/discussion 04/06/2024   Counseling and coordination of care 04/06/2024   DNR (do not resuscitate) discussion 04/06/2024   Acute diastolic congestive heart failure (HCC) 04/06/2024   Cerebral atrophy 04/04/2024   Cervical spinal stenosis 04/04/2024   Leukocytosis 04/04/2024   Acute respiratory failure with hypoxia (HCC) 04/04/2024   Mild protein malnutrition 04/04/2024   Hyperkalemia 04/04/2024   Hyponatremia 04/04/2024   Fatigue 04/04/2024   Volume overload 04/04/2024   Adenocarcinoma of lung (HCC) 01/25/2024   Abnormal dreams 11/08/2023   Short-term memory loss 05/31/2023   Memory deficits 05/31/2023   Impaired executive functioning 05/31/2023   CKD (  chronic kidney disease) stage 4, GFR 15-29 ml/min (HCC) 02/19/2022   Malignant neoplasm of left female breast, unspecified estrogen receptor status, unspecified site of breast (HCC) 02/19/2022   Rotator cuff arthropathy of both shoulders 04/17/2021   Insomnia secondary to chronic pain 10/05/2019   Chronic diastolic CHF  (congestive heart failure) (HCC) 07/18/2019   Hypothyroidism 11/01/2018   Lumbar radiculopathy 10/04/2017   Anxiety disorder 10/19/2016   Acute pancreatitis 09/08/2016   Hypertensive heart disease without heart failure 06/29/2016   Allergic rhinitis 01/01/2016   OSA on CPAP 01/22/2014   Obesity hypoventilation syndrome (HCC) 01/22/2014   Sleep apnea with use of continuous positive airway pressure (CPAP) 01/24/2013   Benign paroxysmal positional vertigo 05/08/2011   Obesity    Hypertension    Iron deficiency anemia 08/09/2009   Hyperlipidemia 03/27/2009   Type 2 diabetes mellitus with other diabetic kidney complication (HCC) 03/27/2009   Vitamin D  deficiency 03/27/2009   Gout, unspecified 03/27/2009   Gastro-esophageal reflux disease without esophagitis 03/27/2009   Hx Breast Cancer, IDC, Stage I, left, receptor +, Her 2 + 01/01/2009   PCP:  Shayne Anes, MD Pharmacy:   [facility], [place] - [address] [place] [address] Phone: [phone] Fax: [phone]     Social Drivers of Health (SDOH) Social History: SDOH Screenings   Food Insecurity: No Food Insecurity (04/07/2024)  Housing: Low Risk (04/07/2024)  Transportation Needs: No Transportation Needs (04/07/2024)  Utilities: Not At Risk (04/07/2024)  Depression (PHQ2-9): High Risk (01/25/2024)  Tobacco Use: Medium Risk (04/04/2024)   SDOH Interventions: Food Insecurity Interventions: Intervention Not Indicated, Inpatient TOC Housing Interventions: Intervention Not Indicated, Inpatient TOC Transportation Interventions: Intervention Not Indicated, Inpatient TOC Utilities Interventions: Intervention Not Indicated, Inpatient TOC   Readmission Risk Interventions    04/07/2024   11:33 AM  Readmission Risk Prevention Plan  Transportation Screening Complete  PCP or Specialist Appt within 3-5 Days Complete  HRI or Home Care Consult Complete  Social Work  Consult for Recovery Care Planning/Counseling Complete  Palliative Care Screening Complete  Medication Review Oceanographer) Complete

## 2024-04-07 NOTE — Plan of Care (Signed)
   Problem: Education: Goal: Knowledge of General Education information will improve Description Including pain rating scale, medication(s)/side effects and non-pharmacologic comfort measures Outcome: Adequate for Discharge   Problem: Health Behavior/Discharge Planning: Goal: Ability to manage health-related needs will improve Outcome: Adequate for Discharge

## 2024-04-07 NOTE — Progress Notes (Signed)
 " Daily Progress Note   Patient Name: Vicki [name]       Date: 04/07/2024 DOB: 1934/12/21  Age: 89 y.o. MRN#: [id number] Attending Physician: Raenelle Coria, MD Primary Care Physician: Shayne Anes, MD Admit Date: 04/04/2024 Length of Stay: 1 day  Reason for Consultation/Follow-up: Establishing goals of care  Subjective:   Reviewed [facility] including recent documentation from hospitalist and cardiologist.  Patient being transitioned over to oral Lasix  today with plan to discharge home with hospice support.  Presented to bedside to see patient.  Patient laying comfortably in bed with daughter and husband present.  Able to follow-up regarding care planning.  Daughter planning to speak with [facility] liaison as has multiple questions about receiving equipment at home.  Plan is for patient to discharge today.  Patient noting she is feeling better from a shortness of breath perspective and looking forward to going home.  Noted would reach out to [facility] liaison to assist in coordinating care.  All questions answered at that time.  Palliative medicine team will be available if needed.  Discussed care with hospitalist, TOC, bedside RN, and [facility] liaison to coordinate care.  Objective:   Vital Signs:  BP 137/62 (BP Location: Right Arm)   Pulse 77   Temp 97.8 F (36.6 C)   Resp 18   Ht 5' 5 (1.651 m)   Wt 107.1 kg   SpO2 94%   BMI 39.29 kg/m   Physical Exam: General: NAD, awake, chronically ill-appearing Cardiovascular: RRR Respiratory: Slightly increased work of breathing noted, not in respiratory distress Abdomen: not distended Neuro: Awake   Assessment & Plan:   Assessment: Patient is an 89 year old female with a past medical history of chronic asthma, stage IV CKD, breast cancer, type 2 diabetes mellitus, hypertension, sleep apnea on CPAP, HFpEF, and recently diagnosed stage IV lung adenocarcinoma who was admitted on 04/04/2024 for management of worsening shortness of breath for 2 days prior to  admission.  Was also reported to have orthopnea and hypoxemia at 89% at home.  Since admission, patient received management for acute hypoxic respiratory failure in the setting of acute on chronic diastolic congestive heart failure, metastatic adenocarcinoma with malignant pleural effusions, and underlying sleep apnea.  Cardiology consulted for recommendations.  Palliative medicine team consulted to assist with complex medical decision making. Of note patient seen by outpatient PMT provider at [facility] on 03/20/2024.  Recommendations/Plan: # Complex medical decision making/goals of care:    - Planning for patient to discharge home today with support through [facility] hospice.                -  Code Status: Full Code                                - Had discussed patient's CODE STATUS and have recommended consideration of change of CODE STATUS to DNR/DNI, especially since family wants patient to remain in her home.  Hospice to continue discussions regarding this once patient home.   # Psycho-social/Spiritual Support:  - Support System: Daughter, husband, son, daughter-in-law   # Discharge Planning: Home with Hospice via [facility] today  Discussed with: Patient, patient's husband, patient's daughter, TOC, bedside RN, hospitalist, [facility] liaison  Thank you for allowing the palliative care team to participate in the care Emmalyne CHRISTELLA Holmes.  Tinnie Radar, DO Palliative Care Provider PMT # [phone]  If patient remains symptomatic despite maximum doses, please call PMT at [phone]  between 0700 and 1900. Outside of these hours, please call attending, as PMT does not have night coverage.  "

## 2024-04-07 NOTE — Progress Notes (Signed)
 IP CM consulted for Heart Failure Home Health Screen; orders previously placed for Heart Failure Navigation Team; clearing consult.

## 2024-04-07 NOTE — Discharge Summary (Signed)
 Physician Discharge Summary  Vicki [name] FMW:[id number] DOB: 25-Jul-1934 DOA: 04/04/2024  PCP: Shayne Anes, MD  Admit date: 04/04/2024 Discharge date: 04/07/2024  Admitted From: Home Disposition: Home with home hospice  Recommendations for Outpatient Follow-up:  Follow up with PCP in 1-2 weeks Please obtain BMP/CBC in one week Further care as per hospice provider  Home Health: N/A Equipment/Devices: Available at home, hospital bed, bedside commode, CPAP and oxygen  Discharge Condition: Poor CODE STATUS: Full code Diet recommendation: Low-salt diet, fluid restrictions  Discharge summary: 89 year old with history of chronic intermittent asthma, stage IV CKD, breast cancer, type 2 diabetes, hypertension, sleep apnea on CPAP at home, recently diagnosed stage IV lung adenocarcinoma and chronic diastolic heart failure brought to the emergency room with progressive shortness of breath for at least 2 days.  Also reported orthopnea and hypoxemia with 89% at home.  She uses CPAP at night but does not use oxygen.  In the emergency room on 2 L oxygen, COVID flu and RSV negative.  Lactic acid normal.  Chest x-ray with low lung volumes, moderate right pleural effusion and right basilar opacity.  Initially given IV fluids and then admitted to the hospital with volume overload and treated with IV diuresis. Patient did some clinical recovery now.  Patient and family wish to go home with hospice support.  Patient is medically stable today and going home.  Treated for following conditions.  Acute hypoxemic respiratory failure which is multifactorial Acute on chronic diastolic congestive heart failure, metastatic adenocarcinoma with malignant pleural effusion, underlying sleep apnea  Echocardiogram with EF 70 to 75%, grade 1 diastolic dysfunction.  Pleural effusion and shortness of breath is likely multifactorial. Responded well to IV diuresis.  Clinically stable today.  On room air today. Seen by  cardiology. Lower extremity duplexes with no evidence of DVT. Renal functions further worse today, creatinine 1.9.  Family desired sending her home with supportive care.  Discharging with Lasix  40 mg daily.  She cannot be on ARB due to worsening kidney functions.   CKD stage IV with hyperkalemia: Treated with IV Lasix .  Potassium normalized.  Going home on oral Lasix .  Will discontinue ARB.    Stage IV non-small cell lung cancer with malignant pleural effusion: Patient has decided not to seek any treatment.  Followed by outpatient palliative care. Palliative care consulted for inpatient consultation and discharge planning. Patient and family desire to go home with outpatient hospice.  Discussed CODE STATUS and recommended no CPR to be documented, family was hesitant and did not change CODE STATUS.  She remains full code.   Hypertension low blood pressure stable on Coreg  and amlodipine .  Holding olmesartan .   Sleep apnea on CPAP: Using at bedtime.  She has CPAP at home.   Hypothyroidism: On Synthroid.  Continue on discharge.  Remains fairly stable today.  She has extensive irreversible conditions.  Going home with home hospice support.  She has adequate support system at home with personal aide.   Discharge Diagnoses:  Principal Problem:   Acute respiratory failure with hypoxia (HCC) Active Problems:   Hypertension   OSA on CPAP   Obesity hypoventilation syndrome (HCC)   CKD (chronic kidney disease) stage 4, GFR 15-29 ml/min (HCC)   Adenocarcinoma of lung (HCC)   Hyperlipidemia   Hypothyroidism   Gastro-esophageal reflux disease without esophagitis   Mild protein malnutrition   Hyperkalemia   Hyponatremia   Volume overload   Palliative care encounter   Goals of care, counseling/discussion   Counseling  and coordination of care   DNR (do not resuscitate) discussion   Acute diastolic congestive heart failure [facility])    Discharge Instructions  Discharge Instructions     Diet  - low sodium heart healthy   Complete by: As directed    Fluid restrictions 1200 ml /day   Increase activity slowly   Complete by: As directed       Allergies as of 04/07/2024       Reactions   Shellfish Allergy Swelling   Other Reaction(s): reaction 1/14 suspected it was from shellfish, Unknown   Sulfa Antibiotics Hives   Other Reaction(s): Unknown   Acetaminophen     Other Reaction(s): causes chest tightness   Levofloxacin    Other Reaction(s): trouble breathing   Metoprolol    Other Reaction(s): Unknown   Nitrofurantoin    Other Reaction(s): dizziness   Simvastatin    Other Reaction(s): myalgia   Sulfamethoxazole-trimethoprim    Other Reaction(s): Unknown   Tramadol    Other Reaction(s): sharp pains in head   Trazodone    Other Reaction(s): ? headache, did not like how it made her feel.   Elemental Sulfur Swelling, Rash   Pantoprazole Sodium Palpitations        Medication List     STOP taking these medications    chlorthalidone 25 MG tablet Commonly known as: HYGROTON   olmesartan  20 MG tablet Commonly known as: BENICAR        TAKE these medications    albuterol 108 (90 Base) MCG/ACT inhaler Commonly known as: VENTOLIN HFA Inhale 2 puffs into the lungs every 4 (four) hours as needed (as needed).   allopurinol  300 MG tablet Commonly known as: ZYLOPRIM  Take 150 mg by mouth daily. What changed:  when to take this reasons to take this   amLODipine  5 MG tablet Commonly known as: NORVASC  Take 5 mg by mouth daily.   aspirin  81 MG tablet Take 81 mg by mouth every other day.   carvedilol  12.5 MG tablet Commonly known as: COREG  Take 1 tablet (12.5 mg total) by mouth 2 (two) times daily with a meal.   CO Q 10 PO Take 1 tablet by mouth daily.   EpiPen  2-Pak 0.3 MG/0.3ML Soaj injection Generic drug: EPINEPHrine  0.3 mg.   furosemide  40 MG tablet Commonly known as: LASIX  Take 1 tablet (40 mg total) by mouth daily.   HYDROcodone -acetaminophen  5-325  MG tablet Commonly known as: NORCO/VICODIN Take 1-2 tablets by mouth every 6 (six) hours as needed for moderate pain.   levothyroxine 50 MCG tablet Commonly known as: SYNTHROID Take 25 mcg by mouth daily.   lidocaine  5 % Commonly known as: Lidoderm  Place 1 patch onto the skin daily. Remove & Discard patch within 12 hours or as directed by MD   megestrol  625 MG/5ML suspension Commonly known as: MEGACE  ES Take 5 mLs (625 mg total) by mouth daily.   OneTouch Ultra test strip Generic drug: glucose blood 1 each by Other route as needed.   prochlorperazine  10 MG tablet Commonly known as: COMPAZINE  Take 1 tablet (10 mg total) by mouth every 6 (six) hours as needed for nausea or vomiting.   rosuvastatin  10 MG tablet Commonly known as: CRESTOR  Take 10 mg by mouth once a week. Wednesdays   Vitamin D  1000 units capsule Take 1,000 Units by mouth daily.        Allergies[1]  Consultations: Cardiology Palliative care   Procedures/Studies: VAS US  LOWER EXTREMITY VENOUS (DVT) Result Date: 04/06/2024  Lower Venous  DVT Study Patient Name:  Vicki [name]  Date of Exam:   04/06/2024 Medical Rec #: [id number]        Accession #:    [id number] Date of Birth: 1934-11-19        Patient Gender: F Patient Age:   89 years Exam Location:  [facility] Procedure:      VAS US  LOWER EXTREMITY VENOUS (DVT) Referring Phys: CONCEPCION RISER --------------------------------------------------------------------------------  Indications: Edema, and SOB.  Limitations: Poor ultrasound/tissue interface. Comparison Study: Previous exam on 08/12/2012 was negative for DVT Performing Technologist: Ezzie Potters RVT, RDMS  Examination Guidelines: A complete evaluation includes B-mode imaging, spectral Doppler, color Doppler, and power Doppler as needed of all accessible portions of each vessel. Bilateral testing is considered an integral part of a complete examination. Limited examinations for reoccurring  indications may be performed as noted. The reflux portion of the exam is performed with the patient in reverse Trendelenburg.  +---------+---------------+---------+-----------+----------+--------------+ RIGHT    CompressibilityPhasicitySpontaneityPropertiesThrombus Aging +---------+---------------+---------+-----------+----------+--------------+ CFV      Full           Yes      Yes                                 +---------+---------------+---------+-----------+----------+--------------+ SFJ      Full                                                        +---------+---------------+---------+-----------+----------+--------------+ FV Prox  Full           Yes      Yes                                 +---------+---------------+---------+-----------+----------+--------------+ FV Mid   Full           Yes      Yes                                 +---------+---------------+---------+-----------+----------+--------------+ FV DistalFull           Yes      Yes                                 +---------+---------------+---------+-----------+----------+--------------+ PFV      Full                                                        +---------+---------------+---------+-----------+----------+--------------+ POP      Full           Yes      Yes                                 +---------+---------------+---------+-----------+----------+--------------+ PTV      Full                                                        +---------+---------------+---------+-----------+----------+--------------+  PERO     Full                                                        +---------+---------------+---------+-----------+----------+--------------+   +---------+---------------+---------+-----------+----------+-------------------+ LEFT     CompressibilityPhasicitySpontaneityPropertiesThrombus Aging       +---------+---------------+---------+-----------+----------+-------------------+ CFV      Full           Yes      Yes                                      +---------+---------------+---------+-----------+----------+-------------------+ SFJ      Full                                                             +---------+---------------+---------+-----------+----------+-------------------+ FV Prox  Full           Yes      Yes                                      +---------+---------------+---------+-----------+----------+-------------------+ FV Mid   Full           Yes      Yes                                      +---------+---------------+---------+-----------+----------+-------------------+ FV DistalFull           Yes      Yes                                      +---------+---------------+---------+-----------+----------+-------------------+ PFV      Full                                                             +---------+---------------+---------+-----------+----------+-------------------+ POP      Full           Yes      Yes                                      +---------+---------------+---------+-----------+----------+-------------------+ PTV      Full                                         Not well visualized +---------+---------------+---------+-----------+----------+-------------------+ PERO     Full  Not well visualized +---------+---------------+---------+-----------+----------+-------------------+     Summary: BILATERAL: - No evidence of deep vein thrombosis seen in the lower extremities, bilaterally. -No evidence of popliteal cyst, bilaterally. -Subcutaneous edema, bilaterally L > R.   *See table(s) above for measurements and observations.    Preliminary    US  Abdomen Limited RUQ (LIVER/GB) Result Date: 04/06/2024 EXAM: Right Upper Quadrant Abdominal Ultrasound 04/06/2024 08:33:00 AM TECHNIQUE:  Real-time ultrasonography of the right upper quadrant of the abdomen was performed. COMPARISON: CT Abdomen Pelvis 09/17/2016. Abdominal ultrasound 04/02/2010. CLINICAL HISTORY: Liver cyst. FINDINGS: LIVER: Normal echogenicity. No intrahepatic biliary ductal dilatation. Hepatopetal flow in the portal vein. There is a septated cyst within the right hepatic lobe, measuring approximately 1.9 x 1.7 x 1.5 cm. BILIARY SYSTEM: No pericholecystic fluid or wall thickening. No cholelithiasis. Common bile duct is not visualized. RIGHT KIDNEY: No hydronephrosis. No echogenic calculi. There is a simple right renal cyst present, measuring approximately 10.0 x 9.5 x 9.4 cm. OTHER: No right upper quadrant ascites. IMPRESSION: 1. Simple right renal cyst measuring approximately 10.0 x 9.5 x 9.4 cm. 2. Septated cyst within the right hepatic lobe measuring approximately 1.9 x 1.7 x 1.5 cm. Electronically signed by: Evalene Coho MD 04/06/2024 08:44 AM EST RP Workstation: [id number]   ECHOCARDIOGRAM COMPLETE Result Date: 04/05/2024    ECHOCARDIOGRAM REPORT   Patient Name:   Vicki [name] Date of Exam: 04/05/2024 Medical Rec #:  [id number]       Height:       65.0 in Accession #:    [id number]      Weight:       225.1 lb Date of Birth:  [birth year]       BSA:          2.080 m Patient Age:    89 years        BP:           152/74 mmHg Patient Gender: F               HR:           81 bpm. Exam Location:  Inpatient Procedure: 2D Echo, Cardiac Doppler and Color Doppler (Both Spectral and Color            Flow Doppler were utilized during procedure). Indications:    I50.40* Unspecified combined systolic (congestive) and diastolic                 (congestive) heart failure  History:        Patient has prior history of Echocardiogram examinations, most                 recent 11/19/2022. CHF; Risk Factors:Sleep Apnea, Hypertension,                 Dyslipidemia and Diabetes. Lung cancer. Volume overload.  Sonographer:    Ellouise Mose RDCS  Referring Phys: [id number] DAVID MANUEL ORTIZ  Sonographer Comments: Technically difficult study due to poor echo windows, patient is obese and suboptimal parasternal window. Image acquisition challenging due to patient body habitus. Definity would have been used, but patient stated she had enough. IMPRESSIONS  1. Probable large liver cyst; suggest dedicated liver ultrasound to better assess.  2. Left ventricular ejection fraction, by estimation, is 70 to 75%. The left ventricle has hyperdynamic function. The left ventricle has no regional wall motion abnormalities. Left ventricular diastolic parameters are consistent with Grade I diastolic dysfunction (impaired relaxation).  3. Right ventricular systolic function is normal.  The right ventricular size is normal. There is moderately elevated pulmonary artery systolic pressure. The estimated right ventricular systolic pressure is 48.6 mmHg.  4. The mitral valve is normal in structure. No evidence of mitral valve regurgitation. No evidence of mitral stenosis.  5. The aortic valve is tricuspid. Aortic valve regurgitation is trivial. No aortic stenosis is present.  6. The inferior vena cava is normal in size with greater than 50% respiratory variability, suggesting right atrial pressure of 3 mmHg. FINDINGS  Left Ventricle: Left ventricular ejection fraction, by estimation, is 70 to 75%. The left ventricle has hyperdynamic function. The left ventricle has no regional wall motion abnormalities. The left ventricular internal cavity size was normal in size. There is no left ventricular hypertrophy. Left ventricular diastolic parameters are consistent with Grade I diastolic dysfunction (impaired relaxation). Right Ventricle: The right ventricular size is normal. Right ventricular systolic function is normal. There is moderately elevated pulmonary artery systolic pressure. The tricuspid regurgitant velocity is 2.90 m/s, and with an assumed right atrial pressure of 15 mmHg, the  estimated right ventricular systolic pressure is 48.6 mmHg. Left Atrium: Left atrial size was normal in size. Right Atrium: Right atrial size was normal in size. Pericardium: There is no evidence of pericardial effusion. Mitral Valve: The mitral valve is normal in structure. Mild mitral annular calcification. No evidence of mitral valve regurgitation. No evidence of mitral valve stenosis. Tricuspid Valve: The tricuspid valve is normal in structure. Tricuspid valve regurgitation is trivial. No evidence of tricuspid stenosis. Aortic Valve: The aortic valve is tricuspid. Aortic valve regurgitation is trivial. No aortic stenosis is present. Pulmonic Valve: The pulmonic valve was not well visualized. Pulmonic valve regurgitation is not visualized. No evidence of pulmonic stenosis. Aorta: The aortic root is normal in size and structure. Venous: The inferior vena cava was not well visualized. The inferior vena cava is normal in size with greater than 50% respiratory variability, suggesting right atrial pressure of 3 mmHg. IAS/Shunts: The interatrial septum was not well visualized. Additional Comments: Probable large liver cyst; suggest dedicated liver ultrasound to better assess.  LEFT VENTRICLE PLAX 2D LVIDd:         3.40 cm     Diastology LVIDs:         2.30 cm     LV e' medial:    5.22 cm/s LV PW:         1.10 cm     LV E/e' medial:  8.3 LV IVS:        1.00 cm     LV e' lateral:   6.31 cm/s LVOT diam:     2.10 cm     LV E/e' lateral: 6.9 LV SV:         53 LV SV Index:   25 LVOT Area:     3.46 cm LV IVRT:       106 msec  LV Volumes (MOD) LV vol d, MOD A2C: 39.8 ml LV vol d, MOD A4C: 46.9 ml LV vol s, MOD A2C: 11.8 ml LV vol s, MOD A4C: 12.3 ml LV SV MOD A2C:     28.0 ml LV SV MOD A4C:     46.9 ml LV SV MOD BP:      32.8 ml RIGHT VENTRICLE             IVC RV S prime:     10.70 cm/s  IVC diam: 1.40 cm TAPSE (M-mode): 1.2 cm LEFT ATRIUM  Index        RIGHT ATRIUM          Index LA diam:        2.80 cm 1.35 cm/m    RA Area:     9.82 cm LA Vol (A2C):   29.8 ml 14.33 ml/m  RA Volume:   17.40 ml 8.37 ml/m LA Vol (A4C):   46.0 ml 22.12 ml/m LA Biplane Vol: 38.4 ml 18.46 ml/m  AORTIC VALVE LVOT Vmax:   89.40 cm/s LVOT Vmean:  58.800 cm/s LVOT VTI:    0.153 m  AORTA Ao Root diam: 3.30 cm Ao Asc diam:  2.80 cm MITRAL VALVE               TRICUSPID VALVE MV Area (PHT): 3.60 cm    TR Peak grad:   33.6 mmHg MV Decel Time: 211 msec    TR Vmax:        290.00 cm/s MV E velocity: 43.50 cm/s MV A velocity: 85.10 cm/s  SHUNTS MV E/A ratio:  0.51        Systemic VTI:  0.15 m                            Systemic Diam: 2.10 cm Redell Shallow MD Electronically signed by Redell Shallow MD Signature Date/Time: 04/05/2024/3:23:29 PM    Final    CT Chest Wo Contrast Result Date: 04/04/2024 EXAM: CT CHEST WITHOUT CONTRAST 04/04/2024 05:01:10 PM TECHNIQUE: CT of the chest was performed without the administration of intravenous contrast. Multiplanar reformatted images are provided for review. Automated exposure control, iterative reconstruction, and/or weight based adjustment of the mA/kV was utilized to reduce the radiation dose to as low as reasonably achievable. COMPARISON: 03/15/2024 CLINICAL HISTORY: Pleural effusion, malignancy suspected. Respiratory illness, nondiagnostic xray. Dyspnea, lung cancer, congestive heart failure. Hypoxia. *tracking code: Bo* FINDINGS: MEDIASTINUM: Global cardiac size within normal limits. No pericardial effusion. Moderate coronary artery calcification. Moderate atherosclerotic calcification within the thoracic aorta. No aortic aneurysm. Central pulmonary arteries are of normal caliber. The central airways are clear. LYMPH NODES: No mediastinal, hilar or axillary lymphadenopathy. LUNGS AND PLEURA: Extensive pleural nodularity is again identified in keeping with pleural metastatic disease, stable since prior examination. Index nodule within the right major fissure measures 2.4 x 2.8 cm. Pleural-based nodule  anteriorly at the right lung base measures 12 mm in maximal diameter. There is, however, better visualization of a hyperdense pleural mass within the basilar right pleural space (series 105, image 2). This is most in keeping with a component of the pleural metastatic disease. Less likely, this may represent a central clot in the setting of a hemothorax. This could be differentiated with contrast-enhanced Mri examination. Moderate right pleural effusion, likely malignant, is again identified, stable since prior examination. Stable compressive atelectasis of the right lung base. Small bilateral pulmonary nodules are again identified with no significant interval change, most in keeping with intrapulmonary metastatic disease. Index nodule within the atelectatic right lower lobe measures 11 mm. No central obstructing lesion. No pneumothorax. No focal consolidation or pulmonary edema. SOFT TISSUES/BONES: Osseous structures are age appropriate. No acute bone abnormality. No lytic or blastic bone lesion. Postsurgical changes noted within the left breast. Moderate subcutaneous edema noted in the left lateral abdominal wall comparison visualized. UPPER ABDOMEN: Limited images of the upper abdomen demonstrate simple cortical cysts within the visualized upper pole of the kidneys bilaterally measuring at least 10.7 cm on the right  and at least 3 cm on the left. No follow up imaging is recommended. IMPRESSION: 1. Stable pleural metastatic disease with moderate right pleural effusion, likely malignant, and stable associated compressive atelectasis at the right lung base. 2. Hyperdense pleural mass in the basilar right pleural space, favored to represent pleural metastatic disease; less likely central clot in the setting of hemothorax, and contrast-enhanced MRI could differentiate. 3. Stable small bilateral pulmonary nodules, most in keeping with intrapulmonary metastatic disease. 4. Moderate coronary artery calcification. 5.  Postsurgical changes in the left breast. 6. Raf score includes aortic atherosclerosis (ICD10-I70.0). Electronically signed by: Dorethia Molt MD 04/04/2024 06:17 PM EST RP Workstation: [id number]   DG Chest Port 1 View Result Date: 04/04/2024 EXAM: 1 VIEW(S) XRAY OF THE CHEST 04/04/2024 12:45:00 PM COMPARISON: 02/06/2024 CLINICAL HISTORY: SOB FINDINGS: LUNGS AND PLEURA: Low lung volumes. Small to moderate right pleural effusion with right basilar opacity. No pneumothorax. HEART AND MEDIASTINUM: Atherosclerotic plaque noted. No acute abnormality of the cardiac and mediastinal silhouettes. BONES AND SOFT TISSUES: Bilateral shoulder degenerative changes. No acute osseous abnormality. IMPRESSION: 1. Low lung volumes, limiting evaluation. 2. Small to moderate right pleural effusion with associated right basilar opacity, likely atelectasis. Electronically signed by: Michaeline Blanch MD 04/04/2024 01:59 PM EST RP Workstation: [id number]   US  CHEST (PLEURAL EFFUSION) Result Date: 03/27/2024 CLINICAL DATA:  Patient with history of remote breast cancer, lung cancer with malignant right pleural effusion; request received for therapeutic right thoracentesis EXAM: CHEST ULTRASOUND COMPARISON:  CT chest 03/15/24 FINDINGS: Limited US  posterior right chest region reveals complex small right pleural effusion/consolidative changes with only minimal free pleural fluid. As patient not overly symptomatic at this time thoracentesis not performed. IMPRESSION: Limited US  posterior right chest region reveals complex small right pleural effusion/consolidative changes with only minimal free pleural fluid. As patient not overly symptomatic at this time thoracentesis not performed Performed by: Franky Rakers, PA-C Electronically Signed   By: JONETTA Faes M.D.   On: 03/27/2024 19:57   CT Chest Wo Contrast Result Date: 03/20/2024 EXAM: CT CHEST WITHOUT CONTRAST 03/15/2024 02:03:45 PM TECHNIQUE: CT of the chest was performed without the  administration of intravenous contrast. Multiplanar reformatted images are provided for review. Automated exposure control, iterative reconstruction, and/or weight based adjustment of the mA/kV was utilized to reduce the radiation dose to as low as reasonably achievable. COMPARISON: Comparison is to pet dated 02/04/2024. Chest CT of 12/17/23. CLINICAL HISTORY: Follow up of presumed right upper lobe lung cancer and pleural metastasis. Multiple lung nodules. * Tracking Code: BO * FINDINGS: LIMITATIONS: Mild limitations secondary to patient body habitus and arm position, not raised above the head. Artifact degradation continued into the upper abdomen. MEDIASTINUM: Small middle mediastinum nodes are similar in that pathologic by size criteria. Hilar regions poorly evaluated without intravenous contrast. Right pleural base nodule versus a subpleural lymph node about the anterior mediastinum measures 12 mm on image 1 or 2 / 2 versus 9 mm on the prior diagnostic CT. CARDIOVASCULAR: Mild cardiomegaly. 3-vessel coronary artery calcification. Thoracic aortic atherosclerosis. Normal aortic caliber. Without pericardial effusion. LUNGS AND PLEURA: Small to moderate right pleural effusion is similar inferiorly with resolved loculated component superiorly. Extensive right-sided pleural base nodularity, consistent with pleural metastasis. A pleural nodule within the right upper lobe measures 1.0 cm on image 53 / 9 and is new since the prior diagnostic CT. The dominant nodule within the right major fissure on prior PET measures 2.9 x 2.1 cm on image 64 / 8 versus 2.5 x  2.0 cm when re-measured in a similar fashion on the prior. Right lower lobe compressive atelectasis is minimally improved. Tiny parenchymal nodules are most apparent on the left. Example 4 mm left lower lobe pulmonary nodule on image 69 / 8, similar. No pneumothorax. SOFT TISSUES/BONES: Degenerative changes of both glenohumeral joints. Thoracic spondylosis. UPPER  ABDOMEN: Limited images of the upper abdomen demonstrate mild right adrenal nodularity, which is similar. Normal left adrenal gland. Bilateral renal lesions which likely represent cysts and minimally complex cysts when compared to the 02/04/2024 PET. Artifact degradation continued into the upper abdomen. IMPRESSION: 1. Mildly progressive right-sided pleural based metastasis. 2. Small to moderate right pleural effusion, similar inferiorly and resolved superiorly. 3. Developing anterior mediastinal adenopathy versus progression of pleural metastasis. 4. Tiny parenchymal pulmonary nodules are suspicious for metastatic disease and not significantly changed. 5. Limitations as detailed above. Electronically signed by: Rockey Kilts MD 03/20/2024 12:22 PM EST RP Workstation: [id number]   (Echo, Carotid, EGD, Colonoscopy, ERCP)    Subjective: Patient seen and examined on morning rounds.  Her husband was at the bedside.  Her daughter arrived.  Patient herself declined to have lab draws and declined medication with the nurses early morning.  She changed her mind later on.  She was comfortable with plan to go home.  Patient told me that she was able to sleep well with slight propped up position without getting short of breath.   Discharge Exam: Vitals:   04/06/24 2331 04/07/24 0033  BP:  137/62  Pulse: 74 77  Resp: 18 18  Temp:  97.8 F (36.6 C)  SpO2: 97% 94%   Vitals:   04/06/24 1934 04/06/24 2331 04/07/24 0033 04/07/24 0500  BP: (!) 133/55  137/62   Pulse: 73 74 77   Resp: 20 18 18    Temp: (!) 97.4 F (36.3 C)  97.8 F (36.6 C)   TempSrc:      SpO2: 98% 97% 94%   Weight:    107.1 kg  Height:        General: Pt is alert, awake, not in acute distress Chronically sick looking.  Not in distress.  On room air today.  She is morbidly obese. Cardiovascular: RRR, S1/S2 +, no rubs, no gallops Respiratory: CTA bilaterally, no wheezing, no rhonchi, poor bilateral air entry.  No added  sounds. Abdominal: Soft, NT, ND, bowel sounds +, obese and pendulous. Extremities: Lateral 1+ edema, left more than right.  Nontender.    The results of significant diagnostics from this hospitalization (including imaging, microbiology, ancillary and laboratory) are listed below for reference.     Microbiology: Recent Results (from the past 240 hours)  Resp panel by RT-PCR (RSV, Flu A&B, Covid) Anterior Nasal Swab     Status: None   Collection Time: 04/04/24  1:11 PM   Specimen: Anterior Nasal Swab  Result Value Ref Range Status   SARS Coronavirus 2 by RT PCR NEGATIVE NEGATIVE Final    Comment: (NOTE) SARS-CoV-2 target nucleic acids are NOT DETECTED.  The SARS-CoV-2 RNA is generally detectable in upper respiratory specimens during the acute phase of infection. The lowest concentration of SARS-CoV-2 viral copies this assay can detect is 138 copies/mL. A negative result does not preclude SARS-Cov-2 infection and should not be used as the sole basis for treatment or other patient management decisions. A negative result may occur with  improper specimen collection/handling, submission of specimen other than nasopharyngeal swab, presence of viral mutation(s) within the areas targeted by this assay, and inadequate  number of viral copies(<138 copies/mL). A negative result must be combined with clinical observations, patient history, and epidemiological information. The expected result is Negative.  Fact Sheet for Patients:  bloggercourse.com  Fact Sheet for Healthcare Providers:  seriousbroker.it  This test is no t yet approved or cleared by the [place]  FDA and  has been authorized for detection and/or diagnosis of SARS-CoV-2 by FDA under an Emergency Use Authorization (EUA). This EUA will remain  in effect (meaning this test can be used) for the duration of the COVID-19 declaration under Section 564(b)(1) of the Act,  21 U.S.C.section 360bbb-3(b)(1), unless the authorization is terminated  or revoked sooner.       Influenza A by PCR NEGATIVE NEGATIVE Final   Influenza B by PCR NEGATIVE NEGATIVE Final    Comment: (NOTE) The Xpert Xpress SARS-CoV-2/FLU/RSV plus assay is intended as an aid in the diagnosis of influenza from Nasopharyngeal swab specimens and should not be used as a sole basis for treatment. Nasal washings and aspirates are unacceptable for Xpert Xpress SARS-CoV-2/FLU/RSV testing.  Fact Sheet for Patients: bloggercourse.com  Fact Sheet for Healthcare Providers: seriousbroker.it  This test is not yet approved or cleared by the [place]  FDA and has been authorized for detection and/or diagnosis of SARS-CoV-2 by FDA under an Emergency Use Authorization (EUA). This EUA will remain in effect (meaning this test can be used) for the duration of the COVID-19 declaration under Section 564(b)(1) of the Act, 21 U.S.C. section 360bbb-3(b)(1), unless the authorization is terminated or revoked.     Resp Syncytial Virus by PCR NEGATIVE NEGATIVE Final    Comment: (NOTE) Fact Sheet for Patients: bloggercourse.com  Fact Sheet for Healthcare Providers: seriousbroker.it  This test is not yet approved or cleared by the [place]  FDA and has been authorized for detection and/or diagnosis of SARS-CoV-2 by FDA under an Emergency Use Authorization (EUA). This EUA will remain in effect (meaning this test can be used) for the duration of the COVID-19 declaration under Section 564(b)(1) of the Act, 21 U.S.C. section 360bbb-3(b)(1), unless the authorization is terminated or revoked.  Performed at [facility], [address], [place], [place] [address]   Culture, blood (routine x 2)     Status: None (Preliminary result)   Collection Time: 04/04/24  3:46 PM   Specimen:  BLOOD  Result Value Ref Range Status   Specimen Description   Final    BLOOD LEFT ANTECUBITAL Performed at [facility], [address] [place], [place] [address]    Special Requests   Final    BOTTLES DRAWN AEROBIC AND ANAEROBIC Blood Culture results may not be optimal due to an inadequate volume of blood received in culture bottles Performed at [facility], [address] [place], [place] [address]    Culture   Final    NO GROWTH 3 DAYS Performed at [facility], [address], [place], [place] [address]    Report Status PENDING  Incomplete     Labs: BNP (last 3 results) No results for input(s): BNP in the last 8760 hours. Basic Metabolic Panel: Recent Labs  Lab 04/04/24 1545 04/04/24 2246 04/05/24 0503 04/07/24 0552  NA 132* 133* 132* 133*  K 6.0* 5.5* 5.5* 4.8  CL 99 100 100 95*  CO2 24 24 24 28   GLUCOSE 116* 164* 111* 106*  BUN 29* 30* 29* 31*  CREATININE 1.76* 1.78* 1.76* 1.90*  CALCIUM  8.9 8.6* 8.8* 9.0   Liver  Function Tests: Recent Labs  Lab 04/04/24 1545 04/05/24 0503  AST 16 13*  ALT 14 13  ALKPHOS 52 51  BILITOT <0.2 0.2  PROT 7.0 6.8  ALBUMIN 3.2* 2.9*   No results for input(s): LIPASE, AMYLASE in the last 168 hours. No results for input(s): AMMONIA in the last 168 hours. CBC: Recent Labs  Lab 04/04/24 1545 04/05/24 0503  WBC 9.8 10.3  NEUTROABS 7.3  --   HGB 10.2* 10.2*  HCT 33.1* 31.7*  MCV 86.0 84.5  PLT 332 352   Cardiac Enzymes: No results for input(s): CKTOTAL, CKMB, CKMBINDEX, TROPONINI in the last 168 hours. BNP: Invalid input(s): POCBNP CBG: No results for input(s): GLUCAP in the last 168 hours. D-Dimer No results for input(s): DDIMER in the last 72 hours. Hgb A1c Recent Labs    04/05/24 0503  HGBA1C 6.8*   Lipid Profile No results for input(s): CHOL, HDL, LDLCALC, TRIG, CHOLHDL, LDLDIRECT in the last 72 hours. Thyroid function  studies No results for input(s): TSH, T4TOTAL, T3FREE, THYROIDAB in the last 72 hours.  Invalid input(s): FREET3 Anemia work up No results for input(s): VITAMINB12, FOLATE, FERRITIN, TIBC, IRON, RETICCTPCT in the last 72 hours. Urinalysis    Component Value Date/Time   COLORURINE YELLOW 12/18/2017 0029   APPEARANCEUR CLEAR 12/18/2017 0029   LABSPEC 1.016 12/18/2017 0029   PHURINE 5.0 12/18/2017 0029   GLUCOSEU NEGATIVE 12/18/2017 0029   HGBUR NEGATIVE 12/18/2017 0029   BILIRUBINUR NEGATIVE 12/18/2017 0029   KETONESUR NEGATIVE 12/18/2017 0029   PROTEINUR NEGATIVE 12/18/2017 0029   UROBILINOGEN 0.2 05/25/2011 0956   NITRITE NEGATIVE 12/18/2017 0029   LEUKOCYTESUR MODERATE (A) 12/18/2017 0029   Sepsis Labs Recent Labs  Lab 04/04/24 1545 04/05/24 0503  WBC 9.8 10.3   Microbiology Recent Results (from the past 240 hours)  Resp panel by RT-PCR (RSV, Flu A&B, Covid) Anterior Nasal Swab     Status: None   Collection Time: 04/04/24  1:11 PM   Specimen: Anterior Nasal Swab  Result Value Ref Range Status   SARS Coronavirus 2 by RT PCR NEGATIVE NEGATIVE Final    Comment: (NOTE) SARS-CoV-2 target nucleic acids are NOT DETECTED.  The SARS-CoV-2 RNA is generally detectable in upper respiratory specimens during the acute phase of infection. The lowest concentration of SARS-CoV-2 viral copies this assay can detect is 138 copies/mL. A negative result does not preclude SARS-Cov-2 infection and should not be used as the sole basis for treatment or other patient management decisions. A negative result may occur with  improper specimen collection/handling, submission of specimen other than nasopharyngeal swab, presence of viral mutation(s) within the areas targeted by this assay, and inadequate number of viral copies(<138 copies/mL). A negative result must be combined with clinical observations, patient history, and epidemiological information. The expected result is  Negative.  Fact Sheet for Patients:  bloggercourse.com  Fact Sheet for Healthcare Providers:  seriousbroker.it  This test is no t yet approved or cleared by the [place]  FDA and  has been authorized for detection and/or diagnosis of SARS-CoV-2 by FDA under an Emergency Use Authorization (EUA). This EUA will remain  in effect (meaning this test can be used) for the duration of the COVID-19 declaration under Section 564(b)(1) of the Act, 21 U.S.C.section 360bbb-3(b)(1), unless the authorization is terminated  or revoked sooner.       Influenza A by PCR NEGATIVE NEGATIVE Final   Influenza B by PCR NEGATIVE NEGATIVE Final    Comment: (NOTE) The Xpert Xpress SARS-CoV-2/FLU/RSV  plus assay is intended as an aid in the diagnosis of influenza from Nasopharyngeal swab specimens and should not be used as a sole basis for treatment. Nasal washings and aspirates are unacceptable for Xpert Xpress SARS-CoV-2/FLU/RSV testing.  Fact Sheet for Patients: bloggercourse.com  Fact Sheet for Healthcare Providers: seriousbroker.it  This test is not yet approved or cleared by the [place]  FDA and has been authorized for detection and/or diagnosis of SARS-CoV-2 by FDA under an Emergency Use Authorization (EUA). This EUA will remain in effect (meaning this test can be used) for the duration of the COVID-19 declaration under Section 564(b)(1) of the Act, 21 U.S.C. section 360bbb-3(b)(1), unless the authorization is terminated or revoked.     Resp Syncytial Virus by PCR NEGATIVE NEGATIVE Final    Comment: (NOTE) Fact Sheet for Patients: bloggercourse.com  Fact Sheet for Healthcare Providers: seriousbroker.it  This test is not yet approved or cleared by the [place]  FDA and has been authorized for detection and/or diagnosis of  SARS-CoV-2 by FDA under an Emergency Use Authorization (EUA). This EUA will remain in effect (meaning this test can be used) for the duration of the COVID-19 declaration under Section 564(b)(1) of the Act, 21 U.S.C. section 360bbb-3(b)(1), unless the authorization is terminated or revoked.  Performed at [facility], [address] [place], [place] [address]   Culture, blood (routine x 2)     Status: None (Preliminary result)   Collection Time: 04/04/24  3:46 PM   Specimen: BLOOD  Result Value Ref Range Status   Specimen Description   Final    BLOOD LEFT ANTECUBITAL Performed at [facility], [address], [place], [place] [address]    Special Requests   Final    BOTTLES DRAWN AEROBIC AND ANAEROBIC Blood Culture results may not be optimal due to an inadequate volume of blood received in culture bottles Performed at [facility], [address] [place], [place] [address]    Culture   Final    NO GROWTH 3 DAYS Performed at [facility], [address] [place], [place] [address]    Report Status PENDING  Incomplete     Time coordinating discharge: 40 minutes  SIGNED:   Renato Applebaum, MD  [facility] 04/07/2024, 10:54 AM     [1]  Allergies Allergen Reactions   Shellfish Allergy Swelling    Other Reaction(s): reaction 1/14 suspected it was from shellfish, Unknown   Sulfa Antibiotics Hives    Other Reaction(s): Unknown   Acetaminophen      Other Reaction(s): causes chest tightness   Levofloxacin     Other Reaction(s): trouble breathing   Metoprolol     Other Reaction(s): Unknown   Nitrofurantoin     Other Reaction(s): dizziness   Simvastatin     Other Reaction(s): myalgia   Sulfamethoxazole-Trimethoprim     Other Reaction(s): Unknown   Tramadol     Other Reaction(s): sharp pains in head   Trazodone     Other Reaction(s): ? headache, did not like how it made her feel.   Elemental Sulfur  Swelling and Rash   Pantoprazole Sodium Palpitations

## 2024-04-07 NOTE — Progress Notes (Signed)
 Assisted patient with breakfast. Patient declined her AM medication verbalizing that this RN and the doctor is crazy to be giving her all these meds although this RN thoroughly explained the indication of each medications. Attending MD aware.

## 2024-04-07 NOTE — Progress Notes (Addendum)
"  °  Progress Note  Patient Name: Vicki [name] Date of Encounter: 04/07/2024 [facility] Cardiologist: Dorn Lesches, MD   Interval Summary   Resting comfortably Reports improvement in symptoms  No issues with breathing, currently on room air Notes reveal that she deferred AM meds, but patient says she is willing to take them  Vital Signs Vitals:   04/06/24 1934 04/06/24 2331 04/07/24 0033 04/07/24 0500  BP: (!) 133/55  137/62   Pulse: 73 74 77   Resp: 20 18 18    Temp: (!) 97.4 F (36.3 C)  97.8 F (36.6 C)   TempSrc:      SpO2: 98% 97% 94%   Weight:    107.1 kg  Height:        Intake/Output Summary (Last 24 hours) at 04/07/2024 0930 Last data filed at 04/07/2024 0558 Gross per 24 hour  Intake 60 ml  Output 1700 ml  Net -1640 ml      04/07/2024    5:00 AM 04/04/2024   12:16 PM 02/28/2024    2:34 PM  Last 3 Weights  Weight (lbs) 236 lb 1.8 oz 225 lb 1.4 oz --  Weight (kg) 107.1 kg 102.1 kg --     Telemetry/ECG  sinus - Personally Reviewed  Physical Exam  GEN: No acute distress.   Neck: No JVD Cardiac: RRR, no murmurs, rubs, or gallops.  Respiratory: distant lung sounds. GI: Soft, nontender, non-distended  MS: 1+ edema  Assessment & Plan   Volume overload Shortness of breath Chronic HFpEF History of breath cancer, recent diagnosis of lung cancer with malignant pleural effusion  Presented with DOE, orthopnea  LVEF 60-65% 11/2022  proBNP mildly elevated Recently diagnosed with malignant pleural effusion, lung cancer  Decision has been made to not pursue treatment instead focusing on making patient comfortable  Started on IV Lasix  this admission --- reports good urine response yesterday with slowing of output today  Echo admission: LVEF 70 to 75%, G1 DD, normal RV systolic function, moderately elevated PASP at 48.6 mmHg, no significant valvular abnormalities, normal IVC  Suspect shortness of breath is multifactorial with new diagnosis of lung cancer  with malignant pleural effusion, as well as underlying CHF  Previously on IV Lasix  80 mg BID, held with elevated creatinine and refusing of meds this AM, consider transition to PO diuresis if patient will take medications  Previously on losartan , held in the setting of hyperkalemia Chest CT this admission showed moderate right pleural effusion, may need to consider thoracentesis but suspect it wont be necessary as it was evaluated just last week and turned down   Per primary History of breast cancer Lung cancer, malignant pleural effusion  Diabetes Hyperlipidemia Hypertension  Hypothyroidism Electrolyte disturbances CKD stage IV GERD OSA on CPAP Goals of care   For questions or updates, please contact [facility] Please consult [email or web address] for contact info under       Signed, Waddell [name], PA-C   Personally seen and examined. Agree with above.  Daughter currently in room.  Explained to her the benefit of 40 mg of p.o. Lasix  to hopefully help keep the lower extremity edema down as well as the pulmonary edema.  She and her mom are in agreement.  I am comfortable with discharge.  I am glad that they are going to utilize hospice services.  Oneil Parchment, MD   "

## 2024-04-07 NOTE — Progress Notes (Signed)
 Heart Failure Navigator Progress Note  Assessed for Heart & Vascular TOC clinic readiness.  Patient does not meet criteria due to EF 70-75%, per MD note patient with history of Metastatic Cancer. No HF TOC. .   Navigator will sign off at this time.   Stephane Haddock, BSN, Scientist, Clinical (histocompatibility And Immunogenetics) Only

## 2024-04-07 NOTE — Progress Notes (Signed)
 Discharge meds in a secure bag delivered to patient by this RN

## 2024-04-09 LAB — CULTURE, BLOOD (ROUTINE X 2): Culture: NO GROWTH

## 2024-04-10 ENCOUNTER — Other Ambulatory Visit (HOSPITAL_COMMUNITY): Payer: Self-pay

## 2024-04-17 ENCOUNTER — Inpatient Hospital Stay: Admitting: Oncology

## 2024-04-17 ENCOUNTER — Inpatient Hospital Stay

## 2024-04-19 ENCOUNTER — Inpatient Hospital Stay

## 2024-04-19 ENCOUNTER — Inpatient Hospital Stay: Admitting: Oncology

## 2024-11-13 ENCOUNTER — Ambulatory Visit: Admitting: Neurology
